# Patient Record
Sex: Female | Born: 1985 | Race: White | Hispanic: No | Smoking: Never smoker
Health system: Southern US, Community
[De-identification: ages and names within clinical notes are randomized; demographics above are authoritative.]

## PROBLEM LIST (undated history)

## (undated) DIAGNOSIS — O24419 Gestational diabetes mellitus in pregnancy, unspecified control: Secondary | ICD-10-CM

## (undated) DIAGNOSIS — Z7251 High risk heterosexual behavior: Secondary | ICD-10-CM

## (undated) DIAGNOSIS — B009 Herpesviral infection, unspecified: Secondary | ICD-10-CM

## (undated) DIAGNOSIS — R809 Proteinuria, unspecified: Secondary | ICD-10-CM

## (undated) HISTORY — DX: High risk heterosexual behavior: Z72.51

## (undated) HISTORY — DX: Gestational diabetes mellitus in pregnancy, unspecified control: O24.419

## (undated) HISTORY — DX: Herpesviral infection, unspecified: B00.9

## (undated) HISTORY — DX: Proteinuria, unspecified: R80.9

---
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
## 2007-02-01 ENCOUNTER — Emergency Department: Payer: Self-pay | Admitting: Emergency Medicine

## 2007-05-30 ENCOUNTER — Emergency Department: Payer: Self-pay | Admitting: Emergency Medicine

## 2007-11-30 ENCOUNTER — Emergency Department: Payer: Self-pay | Admitting: Emergency Medicine

## 2008-06-07 ENCOUNTER — Emergency Department: Payer: Self-pay | Admitting: Emergency Medicine

## 2009-05-23 ENCOUNTER — Emergency Department: Payer: Self-pay | Admitting: Emergency Medicine

## 2010-07-18 ENCOUNTER — Emergency Department: Payer: Self-pay | Admitting: Emergency Medicine

## 2010-08-13 ENCOUNTER — Emergency Department: Payer: Self-pay | Admitting: Emergency Medicine

## 2011-01-22 ENCOUNTER — Emergency Department: Payer: Self-pay | Admitting: Emergency Medicine

## 2013-03-24 ENCOUNTER — Emergency Department: Payer: Self-pay | Admitting: Emergency Medicine

## 2013-03-24 LAB — URINALYSIS, COMPLETE
Bilirubin,UR: NEGATIVE
Blood: NEGATIVE
Glucose,UR: NEGATIVE mg/dL (ref 0–75)
Ph: 6 (ref 4.5–8.0)
Protein: NEGATIVE
RBC,UR: 4 /HPF (ref 0–5)
Specific Gravity: 1.024 (ref 1.003–1.030)
Squamous Epithelial: 1

## 2013-03-24 LAB — PREGNANCY, URINE: Pregnancy Test, Urine: NEGATIVE m[IU]/mL

## 2013-03-25 LAB — WET PREP, GENITAL

## 2013-09-22 ENCOUNTER — Emergency Department: Payer: Self-pay | Admitting: Internal Medicine

## 2013-09-22 LAB — GC/CHLAMYDIA PROBE AMP

## 2013-09-22 LAB — WET PREP, GENITAL

## 2015-03-24 ENCOUNTER — Ambulatory Visit (INDEPENDENT_AMBULATORY_CARE_PROVIDER_SITE_OTHER): Payer: PRIVATE HEALTH INSURANCE | Admitting: Obstetrics and Gynecology

## 2015-03-24 ENCOUNTER — Encounter: Payer: Self-pay | Admitting: Obstetrics and Gynecology

## 2015-03-24 VITALS — BP 119/68 | HR 76 | Ht 66.0 in | Wt 143.2 lb

## 2015-03-24 DIAGNOSIS — Z7251 High risk heterosexual behavior: Secondary | ICD-10-CM | POA: Diagnosis not present

## 2015-03-24 DIAGNOSIS — B009 Herpesviral infection, unspecified: Secondary | ICD-10-CM | POA: Insufficient documentation

## 2015-03-24 DIAGNOSIS — R809 Proteinuria, unspecified: Secondary | ICD-10-CM | POA: Insufficient documentation

## 2015-03-24 NOTE — Progress Notes (Signed)
Subjective:     Patient ID: Cynthia Harrell, female   DOB: November 30, 1985, 29 y.o.   MRN: [id number]  HPI Reports desires for STI screening, had unprotected intercourse 4 days ago with new partner. Denies any symptoms at this time  Review of Systems Has had postcoital BV with previous partner and used boric acid vaginally to treat, last time treated was 3 weeks ago    Objective:   Physical Exam A&O x4 Well groomed thin female in no apparent distress External vulva without redness or lesions Vaginal vault w/o lesions or redness, normal thin d/c noted Cervix parous and with scant white d/c  Wetprep- Occassional clue, neg trich, neg yeast, neg WBC,+ whiff    Assessment:     H/O BV Desires STI screen- too early     Plan:     Ok to use boric acid capsules as needed and previously used.  Will RTC 4 weeks for blood screen for STI.   Cynthia Harrell Trudee Kuster, CNM

## 2015-03-26 ENCOUNTER — Ambulatory Visit (INDEPENDENT_AMBULATORY_CARE_PROVIDER_SITE_OTHER): Payer: PRIVATE HEALTH INSURANCE | Admitting: Obstetrics and Gynecology

## 2015-03-26 ENCOUNTER — Encounter: Payer: Self-pay | Admitting: Obstetrics and Gynecology

## 2015-03-26 VITALS — BP 132/81 | HR 88 | Ht 66.0 in | Wt 141.9 lb

## 2015-03-26 DIAGNOSIS — Z113 Encounter for screening for infections with a predominantly sexual mode of transmission: Secondary | ICD-10-CM

## 2015-03-26 DIAGNOSIS — N9089 Other specified noninflammatory disorders of vulva and perineum: Secondary | ICD-10-CM

## 2015-03-26 NOTE — Progress Notes (Signed)
Subjective:     Patient ID: Cynthia Harrell, female   DOB: 06/03/1986, 29 y.o.   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  Cervix nulliparous and negative CMT, no lesions or d/c Wetprep negative CLUE, neg trich, neg yeast, Occassional WBC     Assessment:     Vulvar lesion, c/w HSV     Plan:     Nuswab sent in  to continue acyclovir. RTC in 2 weeks or later for blood panel to screen for other STIs  Mckinnley Cottier Trudee Kuster, CNM

## 2015-03-29 LAB — CT NG TV HSV BY NAA
Chlamydia by NAA: NEGATIVE
Gonococcus by NAA: NEGATIVE
HSV 1 NAA: NEGATIVE
HSV 2 NAA: NEGATIVE

## 2015-03-30 ENCOUNTER — Ambulatory Visit: Payer: Self-pay | Admitting: Family Medicine

## 2015-03-31 ENCOUNTER — Telehealth: Payer: Self-pay | Admitting: *Deleted

## 2015-03-31 NOTE — Telephone Encounter (Signed)
-----   Message from Evonnie Pat, [place] sent at 03/31/2015  9:31 AM EDT ----- Please let her know Nuswab was negative for STDs

## 2015-03-31 NOTE — Telephone Encounter (Signed)
Notified pt of normal lab results

## 2015-04-03 ENCOUNTER — Encounter: Payer: Self-pay | Admitting: Obstetrics and Gynecology

## 2015-04-03 ENCOUNTER — Ambulatory Visit (INDEPENDENT_AMBULATORY_CARE_PROVIDER_SITE_OTHER): Payer: PRIVATE HEALTH INSURANCE | Admitting: Obstetrics and Gynecology

## 2015-04-03 VITALS — BP 116/76 | HR 68 | Ht 66.0 in | Wt 142.2 lb

## 2015-04-03 DIAGNOSIS — B373 Candidiasis of vulva and vagina: Secondary | ICD-10-CM

## 2015-04-03 DIAGNOSIS — B3731 Acute candidiasis of vulva and vagina: Secondary | ICD-10-CM

## 2015-04-03 MED ORDER — FLUCONAZOLE 150 MG PO TABS: 150.0000 mg | ORAL_TABLET | Freq: Once | ORAL | Status: DC

## 2015-04-03 NOTE — Progress Notes (Signed)
Patient ID: Cynthia Harrell, female   DOB: 1985/12/21, 29 y.o.   MRN: [id number] S: She complains of vaginal discharge for 3 days; described as itchy, white, non bloody, without pelvic pain or abnormal vaginal bleeding. Also reports a small nontender bump on right labia x3 days.  O: A&Ox4, slightly anxious Pelvic exam: normal external genitalia, vulva, vagina, cervix, uterus and adnexa, VULVA: normal appearing vulva with no masses, tenderness or lesions, vulvar lesion c/w folliculitis, VAGINA: normal appearing vagina with normal color and discharge, no lesions, CERVIX: normal appearing cervix without discharge or lesions, cervical discharge present - white and creamy, WET MOUNT done - results: lactobacilli. Microscopic wet-mount exam shows negative for pathogens, normal epithelial cells, lactobacilli.  A: yeast infection Folliculitis secondary to shaving  P: Rx for diflucan 150mg , 3 refills  RTC prn Reassured of no herpatic lesions noted.   Olene Godfrey Trudee Kuster, CNM

## 2015-04-07 LAB — NUSWAB VAGINITIS PLUS (VG+)
ATOPOBIUM VAGINAE: HIGH {score} — AB
BVAB 2: HIGH {score} — AB
CANDIDA GLABRATA, NAA: NEGATIVE
Megasphaera 1: HIGH Score — AB

## 2015-04-09 ENCOUNTER — Other Ambulatory Visit: Payer: Self-pay | Admitting: Obstetrics and Gynecology

## 2015-04-09 ENCOUNTER — Telehealth: Payer: Self-pay | Admitting: *Deleted

## 2015-04-09 DIAGNOSIS — N760 Acute vaginitis: Principal | ICD-10-CM

## 2015-04-09 DIAGNOSIS — B9689 Other specified bacterial agents as the cause of diseases classified elsewhere: Secondary | ICD-10-CM

## 2015-04-09 MED ORDER — CLINDAMYCIN PHOSPHATE (1 DOSE) 2 % VA CREA: 1.0000 | TOPICAL_CREAM | Freq: Two times a day (BID) | VAGINAL | Status: DC

## 2015-04-09 NOTE — Telephone Encounter (Signed)
-----   Message from Evonnie Pat, [place] sent at 04/09/2015 12:23 PM EDT ----- Please let her know vaginal swab was + for BV only, no signs of STDs, see when she last took a dose of the medicine ( and what she took) and if she is feeling better? As I may need to call her in a new rx

## 2015-04-09 NOTE — Telephone Encounter (Signed)
Left detailed message for pt 

## 2015-04-09 NOTE — Telephone Encounter (Signed)
Notified pt of results, states she really isnt feeling much better, she used a boric acid last night due to lots of d/c, states she feels like she may do better with The flagyl, wanted to know what you thought???

## 2015-04-09 NOTE — Telephone Encounter (Signed)
Actually think we need to treat with Clindese gel- rx sent in and she is to use x 2 nights as directed on box, let her know it will be a little more expensive, but worth it if it gets rid of the discharge for good.

## 2015-04-10 ENCOUNTER — Telehealth: Payer: Self-pay | Admitting: Obstetrics and Gynecology

## 2015-04-10 NOTE — Telephone Encounter (Signed)
Spoke with pt and reviewed on how to take medication

## 2015-04-10 NOTE — Telephone Encounter (Signed)
Question about cream for vag bacterial inf... Her rx doesn't say how many days she needs to use it and she wants to know.

## 2015-04-13 ENCOUNTER — Encounter: Payer: Self-pay | Admitting: Family Medicine

## 2015-04-13 ENCOUNTER — Ambulatory Visit (INDEPENDENT_AMBULATORY_CARE_PROVIDER_SITE_OTHER): Payer: PRIVATE HEALTH INSURANCE | Admitting: Family Medicine

## 2015-04-13 VITALS — BP 121/78 | HR 73 | Temp 98.6°F | Ht 67.0 in | Wt 143.0 lb

## 2015-04-13 DIAGNOSIS — Z202 Contact with and (suspected) exposure to infections with a predominantly sexual mode of transmission: Secondary | ICD-10-CM | POA: Diagnosis not present

## 2015-04-13 DIAGNOSIS — N760 Acute vaginitis: Secondary | ICD-10-CM | POA: Diagnosis not present

## 2015-04-13 DIAGNOSIS — B9689 Other specified bacterial agents as the cause of diseases classified elsewhere: Secondary | ICD-10-CM

## 2015-04-13 DIAGNOSIS — Z7251 High risk heterosexual behavior: Secondary | ICD-10-CM

## 2015-04-13 DIAGNOSIS — A499 Bacterial infection, unspecified: Secondary | ICD-10-CM

## 2015-04-13 LAB — WET PREP FOR TRICH, YEAST, CLUE
Clue Cell Exam: POSITIVE — AB
Trichomonas Exam: NEGATIVE
Yeast Exam: NEGATIVE

## 2015-04-13 NOTE — Patient Instructions (Addendum)
Please do all you can to be safe in the future Limit alcohol use Keep condoms with you  Safe Sex Safe sex is about reducing the risk of giving or getting a sexually transmitted disease (STD). STDs are spread through sexual contact involving the genitals, mouth, or rectum. Some STDs can be cured and others cannot. Safe sex can also prevent unintended pregnancies.  WHAT ARE SOME SAFE SEX PRACTICES?  Limit your sexual activity to only one partner who is having sex with only you.  Talk to your partner about his or her past partners, past STDs, and drug use.  Use a condom every time you have sexual intercourse. This includes vaginal, oral, and anal sexual activity. Both females and males should wear condoms during oral sex. Only use latex or polyurethane condoms and water-based lubricants. Using petroleum-based lubricants or oils to lubricate a condom will weaken the condom and increase the chance that it will break. The condom should be in place from the beginning to the end of sexual activity. Wearing a condom reduces, but does not completely eliminate, your risk of getting or giving an STD. STDs can be spread by contact with infected body fluids and skin.  Get vaccinated for hepatitis B and HPV.  Avoid alcohol and recreational drugs, which can affect your judgment. You may forget to use a condom or participate in high-risk sex.  For females, avoid douching after sexual intercourse. Douching can spread an infection farther into the reproductive tract.  Check your body for signs of sores, blisters, rashes, or unusual discharge. See your health care provider if you notice any of these signs.  Avoid sexual contact if you have symptoms of an infection or are being treated for an STD. If you or your partner has herpes, avoid sexual contact when blisters are present. Use condoms at all other times.  If you are at risk of being infected with HIV, it is recommended that you take a prescription medicine  daily to prevent HIV infection. This is called pre-exposure prophylaxis (PrEP). You are considered at risk if:  You are a man who has sex with other men (MSM).  You are a heterosexual man or woman who is sexually active with more than one partner.  You take drugs by injection.  You are sexually active with a partner who has HIV.  Talk with your health care provider about whether you are at high risk of being infected with HIV. If you choose to begin PrEP, you should first be tested for HIV. You should then be tested every 3 months for as long as you are taking PrEP.  See your health care provider for regular screenings, exams, and tests for other STDs. Before having sex with a new partner, each of you should be screened for STDs and should talk about the results with each other. WHAT ARE THE BENEFITS OF SAFE SEX?   There is less chance of getting or giving an STD.  You can prevent unwanted or unintended pregnancies.  By discussing safe sex concerns with your partner, you may increase feelings of intimacy, comfort, trust, and honesty between the two of you. Document Released: 10/13/2004 Document Revised: 01/20/2014 Document Reviewed: 02/27/2012 [facility] Patient Information 2015 [place], [place]. This information is not intended to replace advice given to you by your health care provider. Make sure you discuss any questions you have with your health care provider.

## 2015-04-13 NOTE — Progress Notes (Signed)
BP 121/78 mmHg  Pulse 73  Temp(Src) 98.6 F (37 C)  Ht 5\' 7"  (1.702 m)  Wt 143 lb (64.864 kg)  BMI 22.39 kg/m2  SpO2 99%  LMP 03/27/2015 (Approximate)   Subjective:    Patient ID: Cynthia Harrell, female    DOB: 27-Sep-1985, 29 y.o.   MRN: [id number]  HPI: Cynthia Harrell is a 29 y.o. female  Chief Complaint  Patient presents with  . std check   She is not having any symptoms; no fevers, no discharge She had a new partner; it was kind of a crazy night and she doesn't remember much; he did tell her they had sex She has been a little stressed out She drank too much; not abused, not forced, not roughed up; he had had too much to drink too, so they both had their guard down She just saw Melody at [facility] and had testing done 10 days ago, since the occurrence; she was freaking out and had discharge then; was diagnosed with BV  Relevant past medical, surgical, family and social history reviewed and updated as indicated. Interim medical history since our last visit reviewed. Allergies and medications reviewed and updated.  Review of Systems  Constitutional: Negative for fever.  Genitourinary: Negative for dysuria, vaginal discharge (none now, but did have some last week) and genital sores.  Per HPI unless specifically indicated above     Objective:    BP 121/78 mmHg  Pulse 73  Temp(Src) 98.6 F (37 C)  Ht 5\' 7"  (1.702 m)  Wt 143 lb (64.864 kg)  BMI 22.39 kg/m2  SpO2 99%  LMP 03/27/2015 (Approximate)  Wt Readings from Last 3 Encounters:  04/13/15 143 lb (64.864 kg)  04/03/15 142 lb 3.2 oz (64.501 kg)  03/26/15 141 lb 14.4 oz (64.365 kg)    Physical Exam  Constitutional: She appears well-developed and well-nourished.  Cardiovascular: Normal rate and regular rhythm.   Pulmonary/Chest: Effort normal.  Abdominal: Soft. Bowel sounds are normal. She exhibits no distension. There is no tenderness. There is no guarding.  Genitourinary: There is no rash, tenderness, lesion or  injury on the right labia. There is no rash, tenderness, lesion or injury on the left labia. Uterus is not tender. Cervix exhibits no motion tenderness, no discharge and no friability. Right adnexum displays no mass, no tenderness and no fullness. Left adnexum displays no mass, no tenderness and no fullness. No erythema in the vagina. No signs of injury around the vagina. No vaginal discharge found.  Psychiatric: Her speech is normal and behavior is normal. Judgment and thought content normal. Her mood appears anxious (mildly anxious). Cognition and memory are normal.    Results for orders placed or performed in visit on 04/03/15  NuSwab Vaginitis Plus (VG+)  Result Value Ref Range   Atopobium vaginae High - 2 (A) Score   BVAB 2 High - 2 (A) Score   Megasphaera 1 High - 2 (A) Score   Candida glabrata, NAA Negative Negative      Assessment & Plan:   Problem List Items Addressed This Visit      Other   High-risk sexual behavior    Encouraged condom use, safe partner selection, limit alcohol use      Relevant Orders   HIV antibody (with reflex)   RPR   Wet prep, genital   Hepatitis panel, acute   GC/chlamydia probe amp, urine    Other Visit Diagnoses    Contact with and suspected exposure to infections  with predominantly sexual mode of transmission    -  Primary    Relevant Orders    HIV antibody (with reflex)    RPR    Wet prep, genital    Hepatitis panel, acute    GC/chlamydia probe amp, urine    Bacterial vaginosis        recurrent; managed by GYN       Follow up plan: No Follow-up on file.  Orders Placed This Encounter  Procedures  . Wet prep, genital  . HIV antibody (with reflex)  . RPR  . Hepatitis panel, acute  . GC/chlamydia probe amp, urine

## 2015-04-13 NOTE — Assessment & Plan Note (Signed)
Encouraged condom use, safe partner selection, limit alcohol use

## 2015-04-13 NOTE — Addendum Note (Signed)
Addended by: Sagal Gayton, Satira Anis on: 04/13/2015 09:37 AM   Modules accepted: Orders

## 2015-04-14 ENCOUNTER — Telehealth: Payer: Self-pay | Admitting: Obstetrics and Gynecology

## 2015-04-14 ENCOUNTER — Telehealth: Payer: Self-pay | Admitting: Family Medicine

## 2015-04-14 LAB — RPR: RPR: NONREACTIVE

## 2015-04-14 LAB — GC/CHLAMYDIA PROBE AMP
CHLAMYDIA, DNA PROBE: NEGATIVE
Neisseria gonorrhoeae by PCR: NEGATIVE

## 2015-04-14 LAB — HEPATITIS PANEL, ACUTE
HEP A IGM: NEGATIVE
HEP B S AG: NEGATIVE
Hep B C IgM: NEGATIVE
Hep C Virus Ab: <0.1 s/co ratio (ref 0.0–0.9)

## 2015-04-14 LAB — HIV ANTIBODY (ROUTINE TESTING W REFLEX): HIV SCREEN 4TH GENERATION: NONREACTIVE

## 2015-04-14 NOTE — Telephone Encounter (Signed)
Pt wants you to know she was seen by another dr and he gave her some cream for bacterial vaginitis for 2 days. She wants to know if she needs a f/u with midwife Cynthia Harrell

## 2015-04-14 NOTE — Telephone Encounter (Signed)
Please let pt know HIV, syphilis, and hepatitis labs were all negative

## 2015-04-14 NOTE — Telephone Encounter (Signed)
Patient notified

## 2015-04-14 NOTE — Telephone Encounter (Signed)
Melody what do you suggest for this pt???

## 2015-04-15 ENCOUNTER — Telehealth: Payer: Self-pay

## 2015-04-15 NOTE — Telephone Encounter (Signed)
Not unless symptoms persist or return > 2 weeks from last dose of medicine

## 2015-04-15 NOTE — Telephone Encounter (Signed)
Please look in the chart; I put in a message on teh 26th and you actually talked to her on the 26th about her results Was there something else?

## 2015-04-15 NOTE — Telephone Encounter (Signed)
Notified pt she voiced understanding 

## 2015-04-15 NOTE — Telephone Encounter (Signed)
Patient called and wanted the rest of her lab results.

## 2015-04-16 NOTE — Telephone Encounter (Signed)
The GC/Chlam was not back at the time. Patient notified those were also negative.

## 2015-05-06 ENCOUNTER — Encounter: Payer: Self-pay | Admitting: Obstetrics and Gynecology

## 2015-05-06 ENCOUNTER — Ambulatory Visit (INDEPENDENT_AMBULATORY_CARE_PROVIDER_SITE_OTHER): Payer: PRIVATE HEALTH INSURANCE | Admitting: Obstetrics and Gynecology

## 2015-05-06 VITALS — BP 122/79 | HR 75 | Ht 67.0 in | Wt 142.4 lb

## 2015-05-06 DIAGNOSIS — B373 Candidiasis of vulva and vagina: Secondary | ICD-10-CM | POA: Diagnosis not present

## 2015-05-06 DIAGNOSIS — B3731 Acute candidiasis of vulva and vagina: Secondary | ICD-10-CM

## 2015-05-06 MED ORDER — FLUCONAZOLE 150 MG PO TABS: 150.0000 mg | ORAL_TABLET | Freq: Once | ORAL | Status: DC

## 2015-05-06 NOTE — Progress Notes (Signed)
Patient ID: Cynthia Harrell, female   DOB: 06-26-1986, 29 y.o.   MRN: [id number]  CHIEF COMPLAINT/HPI:  29 y.o. female complains of white, thin and vaginal erythema noted vaginal discharge for 2 week(s). Denies abnormal vaginal bleeding, significant pelvic pain or fever. No UTI symptoms. Sexually active, does not use condoms, no change in partner.  Last unprotected intercourse __ days ago.  Denies history of known exposure to STD or symptoms in partner.  Patient's last menstrual period was 04/27/2015.  No history of STD's.  Review of Systems  Constitutional: Negative for fever and chills Eyes: Negative for visual disturbances Respiratory: Negative for shortness of breath, dyspnea Cardiovascular: Negative for chest pain or palpitations  Gastrointestinal: Negative for vomiting, diarrhea and constipation Genitourinary: Negative for dysuria and urgency Musculoskeletal: Negative for back pain, joint pain, myalgias  Neurological: Negative for dizziness and headaches    Past Medical History: Past Medical History  Diagnosis Date  . 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 Microalbuminuria   . High-risk sexual behavior     Past Surgical History: Past Surgical History  Procedure Laterality Date  . Therapeutic abortion  2008    Obstetrical History: OB History    Gravida Para Term Preterm AB TAB SAB Ectopic Multiple Living   1    1 1           Gynecological History: Pertinent Gynecological History: Menses: flow is moderate and regular every month without intermenstrual spotting Bleeding: none Contraception: abstinence and condoms DES exposure: denies Blood transfusions: none Sexually transmitted diseases: recent diagnosis: herpes and gonnorhea and past history: last act of intercourse 03/20/15 Previous GYN Procedures: labs  Last mammogram: NA Date: NA Last pap: normal Date: 2015   Social History: Social History   Social History  . Marital Status: Single    Spouse Name: N/A  .  Number of Children: N/A  . Years of Education: N/A   Social History Main Topics  . Smoking status: Never Smoker   . Smokeless tobacco: Never Used  . Alcohol Use: No  . Drug Use: No  . Sexual Activity: Yes    Birth Control/ Protection: Condom   Other Topics Concern  . None   Social History Narrative    Family History: Family History  Problem Relation Age of Onset  . Diabetes Father   . Hypertension Mother     Allergies: No Known Allergies      PHYSICAL EXAM: Pelvic - normal external genitalia, vulva, vagina, cervix, uterus and adnexa, VULVA: normal appearing vulva with no masses, tenderness or lesions, vulvar tenderness right labia fold, vulvar erythema labial folds bilaterally, VAGINA: normal appearing vagina with normal color and discharge, no lesions, CERVIX: cervical discharge present - white, copious and thin, cervical motion tenderness absent, nulliparous os, WET MOUNT done - results: KOH done, hyphae, lactobacilli   Labs: No results found for this or any previous visit (from the past 24 hour(s)).   Assessment: Patient Active Problem List   Diagnosis Date Noted  . 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 Microalbuminuria   . High-risk sexual behavior    monial vaginitis with vulvitis Plan:  No orders of the defined types were placed in this encounter.   rx sent in for diflucan x 1 dose  RTC prn  Lynzee Lindquist Valene Bors

## 2015-06-09 ENCOUNTER — Ambulatory Visit (INDEPENDENT_AMBULATORY_CARE_PROVIDER_SITE_OTHER): Payer: PRIVATE HEALTH INSURANCE | Admitting: Obstetrics and Gynecology

## 2015-06-09 ENCOUNTER — Encounter: Payer: Self-pay | Admitting: Obstetrics and Gynecology

## 2015-06-09 VITALS — BP 105/79 | HR 92 | Ht 66.0 in | Wt 137.6 lb

## 2015-06-09 DIAGNOSIS — N762 Acute vulvitis: Secondary | ICD-10-CM | POA: Diagnosis not present

## 2015-06-09 NOTE — Progress Notes (Signed)
Subjective:     Patient ID: [name], female   DOB: 1985-10-01, 29 y.o.   MRN: [id number]  HPI Reports right vulvar irritation without skin changes x 1 week, concerned that yeast infection may have returned  Review of Systems See above Had HSV outbreak 2 weeks ago    Objective:   Physical Exam A&O X4 Well groomed female in no distress Pelvic exam: normal external genitalia, vulva, vagina, cervix, uterus and adnexa. Microscopic wet-mount exam shows negative for pathogens, normal epithelial cells, vaginal pH is 6.5.    Assessment:     Intermittent vulvitis with recurrent HSV outbreaks     Plan:     Reiterated need to take daily Valtrex, may use boric acid capsules qod prn.  Melody Trudee Kuster, cnm

## 2015-06-15 ENCOUNTER — Ambulatory Visit (INDEPENDENT_AMBULATORY_CARE_PROVIDER_SITE_OTHER): Payer: PRIVATE HEALTH INSURANCE | Admitting: Family Medicine

## 2015-06-15 ENCOUNTER — Encounter: Payer: Self-pay | Admitting: Family Medicine

## 2015-06-15 VITALS — BP 131/87 | HR 76 | Temp 98.5°F | Wt 138.0 lb

## 2015-06-15 DIAGNOSIS — F411 Generalized anxiety disorder: Secondary | ICD-10-CM | POA: Diagnosis not present

## 2015-06-15 DIAGNOSIS — B009 Herpesviral infection, unspecified: Secondary | ICD-10-CM | POA: Diagnosis not present

## 2015-06-15 DIAGNOSIS — Z7251 High risk heterosexual behavior: Secondary | ICD-10-CM | POA: Diagnosis not present

## 2015-06-15 MED ORDER — ESCITALOPRAM OXALATE 10 MG PO TABS: 10.0000 mg | ORAL_TABLET | Freq: Every day | ORAL | Status: DC

## 2015-06-15 NOTE — Patient Instructions (Addendum)
Check out therapists on [employer] Start the new medicine Return in 4 weeks for recheck Call me sooner if any problems on the medicine We'll contact you about the lab results If you have not heard anything from my staff in a week about any orders/referrals/studies from today, please contact us here to follow-up [phone] Limit tramadol use while on this medicine; risk of serotonin syndrome with both together for more than just a couple of pills  Safe Sex Safe sex is about reducing the risk of giving or getting a sexually transmitted disease (STD). STDs are spread through sexual contact involving the genitals, mouth, or rectum. Some STDs can be cured and others cannot. Safe sex can also prevent unintended pregnancies.  WHAT ARE SOME SAFE SEX PRACTICES?  Limit your sexual activity to only one partner who is having sex with only you.  Talk to your partner about his or her past partners, past STDs, and drug use.  Use a condom every time you have sexual intercourse. This includes vaginal, oral, and anal sexual activity. Both females and males should wear condoms during oral sex. Only use latex or polyurethane condoms and water-based lubricants. Using petroleum-based lubricants or oils to lubricate a condom will weaken the condom and increase the chance that it will break. The condom should be in place from the beginning to the end of sexual activity. Wearing a condom reduces, but does not completely eliminate, your risk of getting or giving an STD. STDs can be spread by contact with infected body fluids and skin.  Get vaccinated for hepatitis B and HPV.  Avoid alcohol and recreational drugs, which can affect your judgment. You may forget to use a condom or participate in high-risk sex.  For females, avoid douching after sexual intercourse. Douching can spread an infection farther into the reproductive tract.  Check your body for signs of sores, blisters, rashes, or unusual discharge. See  your health care provider if you notice any of these signs.  Avoid sexual contact if you have symptoms of an infection or are being treated for an STD. If you or your partner has herpes, avoid sexual contact when blisters are present. Use condoms at all other times.  If you are at risk of being infected with HIV, it is recommended that you take a prescription medicine daily to prevent HIV infection. This is called pre-exposure prophylaxis (PrEP). You are considered at risk if:  You are a man who has sex with other men (MSM).  You are a heterosexual man or woman who is sexually active with more than one partner.  You take drugs by injection.  You are sexually active with a partner who has HIV.  Talk with your health care provider about whether you are at high risk of being infected with HIV. If you choose to begin PrEP, you should first be tested for HIV. You should then be tested every 3 months for as long as you are taking PrEP.  See your health care provider for regular screenings, exams, and tests for other STDs. Before having sex with a new partner, each of you should be screened for STDs and should talk about the results with each other. WHAT ARE THE BENEFITS OF SAFE SEX?   There is less chance of getting or giving an STD.  You can prevent unwanted or unintended pregnancies.  By discussing safe sex concerns with your partner, you may increase feelings of intimacy, comfort, trust, and honesty between the two of you. Document  Released: 10/13/2004 Document Revised: 01/20/2014 Document Reviewed: 02/27/2012 [facility] Patient Information 2015 [place], [place]. This information is not intended to replace advice given to you by your health care provider. Make sure you discuss any questions you have with your health care provider. Sexually Transmitted Disease A sexually transmitted disease (STD) is a disease or infection that may be passed (transmitted) from person to person, usually during  sexual activity. This may happen by way of saliva, semen, blood, vaginal mucus, or urine. Common STDs include:   Gonorrhea.   Chlamydia.   Syphilis.   HIV and AIDS.   Genital herpes.   Hepatitis B and C.   Trichomonas.   Human papillomavirus (HPV).   Pubic lice.   Scabies.  Mites.  Bacterial vaginosis. WHAT ARE CAUSES OF STDs? An STD may be caused by bacteria, a virus, or parasites. STDs are often transmitted during sexual activity if one person is infected. However, they may also be transmitted through nonsexual means. STDs may be transmitted after:   Sexual intercourse with an infected person.   Sharing sex toys with an infected person.   Sharing needles with an infected person or using unclean piercing or tattoo needles.  Having intimate contact with the genitals, mouth, or rectal areas of an infected person.   Exposure to infected fluids during birth. WHAT ARE THE SIGNS AND SYMPTOMS OF STDs? Different STDs have different symptoms. Some people may not have any symptoms. If symptoms are present, they may include:   Painful or bloody urination.   Pain in the pelvis, abdomen, vagina, anus, throat, or eyes.   A skin rash, itching, or irritation.  Growths, ulcerations, blisters, or sores in the genital and anal areas.  Abnormal vaginal discharge with or without bad odor.   Penile discharge in men.   Fever.   Pain or bleeding during sexual intercourse.   Swollen glands in the groin area.   Yellow skin and eyes (jaundice). This is seen with hepatitis.   Swollen testicles.  Infertility.  Sores and blisters in the mouth. HOW ARE STDs DIAGNOSED? To make a diagnosis, your health care provider may:   Take a medical history.   Perform a physical exam.   Take a sample of any discharge to examine.  Swab the throat, cervix, opening to the penis, rectum, or vagina for testing.  Test a sample of your first morning urine.   Perform  blood tests.   Perform a Pap test, if this applies.   Perform a colposcopy.   Perform a laparoscopy.  HOW ARE STDs TREATED? Treatment depends on the STD. Some STDs may be treated but not cured.   Chlamydia, gonorrhea, trichomonas, and syphilis can be cured with antibiotic medicine.   Genital herpes, hepatitis, and HIV can be treated, but not cured, with prescribed medicines. The medicines lessen symptoms.   Genital warts from HPV can be treated with medicine or by freezing, burning (electrocautery), or surgery. Warts may come back.   HPV cannot be cured with medicine or surgery. However, abnormal areas may be removed from the cervix, vagina, or vulva.   If your diagnosis is confirmed, your recent sexual partners need treatment. This is true even if they are symptom-free or have a negative culture or evaluation. They should not have sex until their health care providers say it is okay. HOW CAN I REDUCE MY RISK OF GETTING AN STD? Take these steps to reduce your risk of getting an STD:  Use latex condoms, dental dams, and water-soluble  lubricants during sexual activity. Do not use petroleum jelly or oils.  Avoid having multiple sex partners.  Do not have sex with someone who has other sex partners.  Do not have sex with anyone you do not know or who is at high risk for an STD.  Avoid risky sex practices that can break your skin.  Do not have sex if you have open sores on your mouth or skin.  Avoid drinking too much alcohol or taking illegal drugs. Alcohol and drugs can affect your judgment and put you in a vulnerable position.  Avoid engaging in oral and anal sex acts.  Get vaccinated for HPV and hepatitis. If you have not received these vaccines in the past, talk to your health care provider about whether one or both might be right for you.   If you are at risk of being infected with HIV, it is recommended that you take a prescription medicine daily to prevent HIV  infection. This is called pre-exposure prophylaxis (PrEP). You are considered at risk if:  You are a man who has sex with other men (MSM).  You are a heterosexual man or woman and are sexually active with more than one partner.  You take drugs by injection.  You are sexually active with a partner who has HIV.  Talk with your health care provider about whether you are at high risk of being infected with HIV. If you choose to begin PrEP, you should first be tested for HIV. You should then be tested every 3 months for as long as you are taking PrEP.  WHAT SHOULD I DO IF I THINK I HAVE AN STD?  See your health care provider.   Tell your sexual partner(s). They should be tested and treated for any STDs.  Do not have sex until your health care provider says it is okay. WHEN SHOULD I GET IMMEDIATE MEDICAL CARE? Contact your health care provider right away if:   You have severe abdominal pain.  You are a man and notice swelling or pain in your testicles.  You are a woman and notice swelling or pain in your vagina. Document Released: 11/26/2002 Document Revised: 09/10/2013 Document Reviewed: 03/26/2013 [employer] Patient Information 2015 [place], [place]. This information is not intended to replace advice given to you by your health care provider. Make sure you discuss any questions you have with your health care provider.

## 2015-06-15 NOTE — Assessment & Plan Note (Signed)
Start new medicine; return in 4 weeks, but call sooner if needed; discussed risk of serotonin syndrome, don't use tramadol often

## 2015-06-15 NOTE — Assessment & Plan Note (Signed)
Limit stress; encouraged her to start working with counselor to work on coping strategies

## 2015-06-15 NOTE — Progress Notes (Signed)
BP 131/87 mmHg  Pulse 76  Temp(Src) 98.5 F (36.9 C)  Wt 138 lb (62.596 kg)  SpO2 99%  LMP 06/14/2015 (Approximate)   Subjective:    Patient ID: Cynthia Harrell, female    DOB: 1986/07/03, 29 y.o.   MRN: [id number]  HPI: Cynthia Harrell is a 29 y.o. female  Chief Complaint  Patient presents with  . Exposure to STD    She just wants to get retested from last appointment.   No trips to urgent care since last visit; she went to see Gillie Manners last week or the week before; she thought she had a yeast infection; pH was a little off, but no BV or yeast; just pH was off; she was having some discharge, mild itching; she had had a yeast infection in August She wants to be tested for STDs again, HIV, RPR, and hepatitis No episodes of unprotected intercourse since July She denies being in a position in which she is being coerced or forced to have sex; no controlling ex-boyfriend, e.g. No fevers or night sweats; one or two herpes outbreaks since July 1st; [name] has her taking her preventive treatment once a day, but has been under some stress We talked about stress, something prescribed earlier to help her mind ease off; she thought about maybe going back on those; she finds herself worrying about anything, everything; can worry about a lot of different things; she can talk herself out of it for a bit, but keeps coming in her mind  Depression screen [facility] 2/9 06/15/2015  Decreased Interest 3  Down, Depressed, Hopeless 1  PHQ - 2 Score 4  Altered sleeping 1  Tired, decreased energy 1  Change in appetite 1  Feeling bad or failure about yourself  1  Trouble concentrating 0  Moving slowly or fidgety/restless 0  Suicidal thoughts 0  PHQ-9 Score 8   She is currently on her period  Relevant past medical, surgical, family and social history reviewed and updated as indicated. Interim medical history since our last visit reviewed. Allergies and medications reviewed and updated.  Review of  Systems Per HPI unless specifically indicated above     Objective:    BP 131/87 mmHg  Pulse 76  Temp(Src) 98.5 F (36.9 C)  Wt 138 lb (62.596 kg)  SpO2 99%  LMP 06/14/2015 (Approximate)  Wt Readings from Last 3 Encounters:  06/15/15 138 lb (62.596 kg)  06/09/15 137 lb 9.6 oz (62.415 kg)  05/06/15 142 lb 6.4 oz (64.592 kg)    Physical Exam  Constitutional: She appears well-developed and well-nourished.  HENT:  Mouth/Throat: Mucous membranes are normal.  Eyes: EOM are normal. No scleral icterus.  Cardiovascular: Normal rate and regular rhythm.   Pulmonary/Chest: Effort normal and breath sounds normal.  Psychiatric: She has a normal mood and affect. Her behavior is normal.       Assessment & Plan:   Problem List Items Addressed This Visit      Other   Herpes simplex without complication    Limit stress; encouraged her to start working with counselor to work on coping strategies      High-risk sexual behavior - Primary    Check STD screen at patient's request; see AVS for counseling information given      Relevant Orders   HIV antibody   RPR   Hepatitis panel, acute   Generalized anxiety disorder    Start new medicine; return in 4 weeks, but call sooner if needed;  discussed risk of serotonin syndrome, don't use tramadol often         Follow up plan: Return in about 4 weeks (around 07/13/2015) for medicine follow-up.  Orders Placed This Encounter  Procedures  . HIV antibody  . RPR  . Hepatitis panel, acute   Meds ordered this encounter  Medications  . escitalopram (LEXAPRO) 10 MG tablet    Sig: Take 1 tablet (10 mg total) by mouth daily.    Dispense:  30 tablet    Refill:  0

## 2015-06-15 NOTE — Assessment & Plan Note (Addendum)
Check STD screen at patient's request; see AVS for counseling information given

## 2015-06-16 LAB — HIV ANTIBODY (ROUTINE TESTING W REFLEX): HIV Screen 4th Generation wRfx: NONREACTIVE

## 2015-06-16 LAB — HEPATITIS PANEL, ACUTE
HEP A IGM: NEGATIVE
HEP B S AG: NEGATIVE
Hep B C IgM: NEGATIVE

## 2015-06-16 LAB — RPR: RPR Ser Ql: NONREACTIVE

## 2015-07-08 ENCOUNTER — Encounter: Payer: Self-pay | Admitting: Family Medicine

## 2015-07-08 ENCOUNTER — Ambulatory Visit (INDEPENDENT_AMBULATORY_CARE_PROVIDER_SITE_OTHER): Payer: PRIVATE HEALTH INSURANCE | Admitting: Family Medicine

## 2015-07-08 VITALS — BP 115/76 | HR 80 | Temp 98.3°F | Wt 141.0 lb

## 2015-07-08 DIAGNOSIS — Z7251 High risk heterosexual behavior: Secondary | ICD-10-CM | POA: Diagnosis not present

## 2015-07-08 DIAGNOSIS — A499 Bacterial infection, unspecified: Secondary | ICD-10-CM | POA: Diagnosis not present

## 2015-07-08 DIAGNOSIS — N898 Other specified noninflammatory disorders of vagina: Secondary | ICD-10-CM | POA: Insufficient documentation

## 2015-07-08 DIAGNOSIS — Z8742 Personal history of other diseases of the female genital tract: Secondary | ICD-10-CM | POA: Diagnosis not present

## 2015-07-08 DIAGNOSIS — F411 Generalized anxiety disorder: Secondary | ICD-10-CM

## 2015-07-08 DIAGNOSIS — B9689 Other specified bacterial agents as the cause of diseases classified elsewhere: Secondary | ICD-10-CM

## 2015-07-08 DIAGNOSIS — N760 Acute vaginitis: Secondary | ICD-10-CM | POA: Diagnosis not present

## 2015-07-08 LAB — WET PREP FOR TRICH, YEAST, CLUE
CLUE CELL EXAM: POSITIVE — AB
Trichomonas Exam: NEGATIVE
Yeast Exam: NEGATIVE

## 2015-07-08 NOTE — Patient Instructions (Addendum)
Do practice safe sex and smart partner selection Return some morning soon for urine testing We'll contact you about the test done today  Safe Sex Safe sex is about reducing the risk of giving or getting a sexually transmitted disease (STD). STDs are spread through sexual contact involving the genitals, mouth, or rectum. Some STDs can be cured and others cannot. Safe sex can also prevent unintended pregnancies.  WHAT ARE SOME SAFE SEX PRACTICES?  Limit your sexual activity to only one partner who is having sex with only you.  Talk to your partner about his or her past partners, past STDs, and drug use.  Use a condom every time you have sexual intercourse. This includes vaginal, oral, and anal sexual activity. Both females and males should wear condoms during oral sex. Only use latex or polyurethane condoms and water-based lubricants. Using petroleum-based lubricants or oils to lubricate a condom will weaken the condom and increase the chance that it will break. The condom should be in place from the beginning to the end of sexual activity. Wearing a condom reduces, but does not completely eliminate, your risk of getting or giving an STD. STDs can be spread by contact with infected body fluids and skin.  Get vaccinated for hepatitis B and HPV.  Avoid alcohol and recreational drugs, which can affect your judgment. You may forget to use a condom or participate in high-risk sex.  For females, avoid douching after sexual intercourse. Douching can spread an infection farther into the reproductive tract.  Check your body for signs of sores, blisters, rashes, or unusual discharge. See your health care provider if you notice any of these signs.  Avoid sexual contact if you have symptoms of an infection or are being treated for an STD. If you or your partner has herpes, avoid sexual contact when blisters are present. Use condoms at all other times.  If you are at risk of being infected with HIV, it is  recommended that you take a prescription medicine daily to prevent HIV infection. This is called pre-exposure prophylaxis (PrEP). You are considered at risk if:  You are a man who has sex with other men (MSM).  You are a heterosexual man or woman who is sexually active with more than one partner.  You take drugs by injection.  You are sexually active with a partner who has HIV.  Talk with your health care provider about whether you are at high risk of being infected with HIV. If you choose to begin PrEP, you should first be tested for HIV. You should then be tested every 3 months for as long as you are taking PrEP.  See your health care provider for regular screenings, exams, and tests for other STDs. Before having sex with a new partner, each of you should be screened for STDs and should talk about the results with each other. WHAT ARE THE BENEFITS OF SAFE SEX?   There is less chance of getting or giving an STD.  You can prevent unwanted or unintended pregnancies.  By discussing safe sex concerns with your partner, you may increase feelings of intimacy, comfort, trust, and honesty between the two of you.   This information is not intended to replace advice given to you by your health care provider. Make sure you discuss any questions you have with your health care provider.   Document Released: 10/13/2004 Document Revised: 09/26/2014 Document Reviewed: 02/27/2012 Elsevier Interactive Patient Education [employer].

## 2015-07-08 NOTE — Assessment & Plan Note (Signed)
She never started the medicine; she does not feel she needs it; that is fine with me and I told her I certainly was not upset with her for not starting the medicine; glad to hear she is doing better; she had a lot of questions concerning sexually transmitted diseases and I think that is where some of her anxiety stems

## 2015-07-08 NOTE — Assessment & Plan Note (Signed)
Request copy of pap, path, colpo reports; would recommend she have pap for follow-up based on those reports and current [facility] recommendations

## 2015-07-08 NOTE — Assessment & Plan Note (Signed)
Positive clue cells today, indicative of BV; she has this frequently and uses boric acid per GYN recommendations; explained dx today and encouraged to talk with GYN about other treatment options; she will start the boric acid treatment per their usual instructions after she finishes her period and then call them

## 2015-07-08 NOTE — Progress Notes (Signed)
BP 115/76 mmHg  Pulse 80  Temp(Src) 98.3 F (36.8 C)  Wt 141 lb (63.957 kg)  SpO2 98%  LMP 06/14/2015 (Approximate)   Subjective:    Patient ID: Cynthia Harrell, female    DOB: 02/08/1986, 29 y.o.   MRN: [id number]  HPI: Cynthia Harrell is a 29 y.o. female  Chief Complaint  Patient presents with  . Anxiety    She never started the Escitalopram but she does still have it. She felt like she was doing better.   . Vaginal Discharge    She thinks she might have BV   Patient is here for follow-up; she read about concerns about during the 3rd trimester; she feels better and feels okay since last visit; does not feel she needs it Appetite is good; playing softball this week, sleeping well  She thinks she has BV again; she is having a little bit of discharge; kind of watery, might be normal, not sure what's normal and what's not;  She has not had intercourse since July  A guy that she was talking to was tested for STDs, and he had non-gonococcal urethritis; he was treated; they have been talking to each other;  She had hx of chlamydia years ago, discovered on pap smear  She reports a hx of an abnormal pap smear; done at the health dept; they did what she describes to be a colposcopy and they said that was fine; her last pap smear here was December 26, 2013 and we reviewed that together; NIL  Relevant past medical, surgical, family and social history reviewed and updated as indicated. Interim medical history since our last visit reviewed. Allergies and medications reviewed and updated.  Review of Systems Per HPI unless specifically indicated above     Objective:    BP 115/76 mmHg  Pulse 80  Temp(Src) 98.3 F (36.8 C)  Wt 141 lb (63.957 kg)  SpO2 98%  LMP 06/14/2015 (Approximate)  Wt Readings from Last 3 Encounters:  07/08/15 141 lb (63.957 kg)  06/15/15 138 lb (62.596 kg)  06/09/15 137 lb 9.6 oz (62.415 kg)    Physical Exam  Constitutional: She appears well-developed and  well-nourished.  Eyes: EOM are normal. No scleral icterus.  Cardiovascular: Normal rate.   Pulmonary/Chest: Effort normal.  Abdominal: She exhibits no distension.  Genitourinary: There is no rash on the right labia. There is no rash on the left labia. There is bleeding (scant early bleeding consistent with starting menstrual period) in the vagina. No erythema or tenderness in the vagina. Vaginal discharge (scant menstrual-type discharge at the cervical os and fornix; no odor, no clumpy white discharge) found.  Skin: No lesion and no rash noted.  Psychiatric: She has a normal mood and affect. Her behavior is normal. Judgment and thought content normal.    Results for orders placed or performed in visit on 06/15/15  HIV antibody  Result Value Ref Range   HIV Screen 4th Generation wRfx Non Reactive Non Reactive  RPR  Result Value Ref Range   RPR Ser Ql Non Reactive Non Reactive  Hepatitis panel, acute  Result Value Ref Range   Hep A IgM Negative Negative   Hepatitis B Surface Ag Negative Negative   Hep B C IgM Negative Negative   Hep C Virus Ab <0.1 0.0 - 0.9 s/co ratio      Assessment & Plan:   Problem List Items Addressed This Visit      Genitourinary   Bacterial vaginosis  Positive clue cells today, indicative of BV; she has this frequently and uses boric acid per GYN recommendations; explained dx today and encouraged to talk with GYN about other treatment options; she will start the boric acid treatment per their usual instructions after she finishes her period and then call them        Other   High-risk sexual behavior    She wants to be smart and check for chlamydia, so she'll return to do an early morning void for testing; use condoms, be safe, smart partner selection      Relevant Orders   WET PREP FOR [place], YEAST, CLUE   Generalized anxiety disorder    She never started the medicine; she does not feel she needs it; that is fine with me and I told her I certainly  was not upset with her for not starting the medicine; glad to hear she is doing better; she had a lot of questions concerning sexually transmitted diseases and I think that is where some of her anxiety stems      Vaginal discharge - Primary    Wet mount collected today and it was positive for clue cells; see under BV section      Relevant Orders   WET PREP FOR [place], YEAST, CLUE   Hx of abnormal cervical Pap smear    Request copy of pap, path, colpo reports; would recommend she have pap for follow-up based on those reports and current [facility]P recommendations         Follow up plan: Return this week or next in the morning, for urine test.  Offered pap smear in the next month or so and recommend 3 negative paps before going to every 3 years, but I need to know what her prior pap and path reports were to really make educated, evidence-based recommendation [facility] staff to get records with path / pap report from health dept  Face-to-face time with patient was more than 15 minutes, >50% time spent counseling and coordination of care

## 2015-07-08 NOTE — Assessment & Plan Note (Signed)
Wet mount collected today and it was positive for clue cells; see under BV section

## 2015-07-08 NOTE — Assessment & Plan Note (Signed)
She wants to be smart and check for chlamydia, so she'll return to do an early morning void for testing; use condoms, be safe, smart partner selection

## 2015-07-09 ENCOUNTER — Other Ambulatory Visit: Payer: PRIVATE HEALTH INSURANCE

## 2015-07-09 DIAGNOSIS — Z7251 High risk heterosexual behavior: Secondary | ICD-10-CM

## 2015-07-11 ENCOUNTER — Other Ambulatory Visit: Payer: Self-pay | Admitting: Family Medicine

## 2015-07-11 LAB — GC/CHLAMYDIA PROBE AMP
Chlamydia trachomatis, NAA: NEGATIVE
Neisseria gonorrhoeae by PCR: NEGATIVE

## 2015-07-14 ENCOUNTER — Ambulatory Visit: Payer: PRIVATE HEALTH INSURANCE | Admitting: Family Medicine

## 2015-07-21 ENCOUNTER — Telehealth: Payer: Self-pay

## 2015-07-21 ENCOUNTER — Telehealth: Payer: Self-pay | Admitting: Obstetrics and Gynecology

## 2015-07-21 NOTE — Telephone Encounter (Signed)
PT CALLED AND IS HAVING CONCERNS ABOUT A BACTERIAL INFECTION, PT WANTED TO BE SEEN THIS WEEK BUT I TOLD HER [facility] IS OUT OF THE OFFICE AND SHE WANTED YOU TO CAL HER BACK.

## 2015-07-21 NOTE — Telephone Encounter (Signed)
Advised patient that we had not gotten the results from the health dept yet. She has an appt. With [place] for a CPE on Thursday and will go ahead and get pap done then.

## 2015-07-21 NOTE — Telephone Encounter (Signed)
Patient would like you to call her back to be sure that you have received her medical records regarding her previous pap smears. Please call.

## 2015-07-22 NOTE — Telephone Encounter (Signed)
Discussed pts sx she has appt with her PCP on 07/23/15 will have them do a swab for her BV

## 2015-08-03 ENCOUNTER — Telehealth: Payer: Self-pay | Admitting: Obstetrics and Gynecology

## 2015-08-03 NOTE — Telephone Encounter (Signed)
Pt needs rx for bacterial vaginosis.  / needs to talk about the boric acid

## 2015-08-04 ENCOUNTER — Encounter: Payer: Self-pay | Admitting: Physician Assistant

## 2015-08-04 ENCOUNTER — Ambulatory Visit: Payer: Self-pay | Admitting: Physician Assistant

## 2015-08-04 VITALS — BP 119/78 | HR 89 | Temp 98.9°F

## 2015-08-04 DIAGNOSIS — Z299 Encounter for prophylactic measures, unspecified: Secondary | ICD-10-CM

## 2015-08-04 DIAGNOSIS — J0300 Acute streptococcal tonsillitis, unspecified: Secondary | ICD-10-CM

## 2015-08-04 MED ORDER — FLUCONAZOLE 150 MG PO TABS: 150.0000 mg | ORAL_TABLET | Freq: Once | ORAL | Status: DC

## 2015-08-04 MED ORDER — PENICILLIN V POTASSIUM 500 MG PO TABS: 500.0000 mg | ORAL_TABLET | Freq: Four times a day (QID) | ORAL | Status: DC

## 2015-08-04 NOTE — Telephone Encounter (Signed)
[facility]- what to do with this pt???

## 2015-08-04 NOTE — Progress Notes (Signed)
S/ ST x 2 days , hurts to swallow, tender swollen glands, malaise , achy , No other  Complaints  O/ VSS  Mildly ill appearing , ENT 4+ enlarged tonsils with weepy  Drainage, no exudate Neck supple + tender ant cerv nodes heart rsr lungs clear  Rapid strep +    A/ strep tonsilitis   P /PCN 500mg  one qid x 10 days . chloroseptic , analgesics, cold fluids  diflucan .

## 2015-08-06 ENCOUNTER — Encounter: Payer: Self-pay | Admitting: Family Medicine

## 2015-08-06 ENCOUNTER — Ambulatory Visit (INDEPENDENT_AMBULATORY_CARE_PROVIDER_SITE_OTHER): Payer: PRIVATE HEALTH INSURANCE | Admitting: Family Medicine

## 2015-08-06 VITALS — Ht 66.5 in | Wt 140.0 lb

## 2015-08-06 DIAGNOSIS — N760 Acute vaginitis: Secondary | ICD-10-CM

## 2015-08-06 DIAGNOSIS — B9689 Other specified bacterial agents as the cause of diseases classified elsewhere: Secondary | ICD-10-CM

## 2015-08-06 DIAGNOSIS — J020 Streptococcal pharyngitis: Secondary | ICD-10-CM | POA: Diagnosis not present

## 2015-08-06 DIAGNOSIS — A499 Bacterial infection, unspecified: Secondary | ICD-10-CM | POA: Diagnosis not present

## 2015-08-06 MED ORDER — METRONIDAZOLE 500 MG PO TABS: 500.0000 mg | ORAL_TABLET | Freq: Two times a day (BID) | ORAL | Status: DC

## 2015-08-06 NOTE — Assessment & Plan Note (Signed)
Will use metronidazole treatment once she finishes her PCN

## 2015-08-06 NOTE — Assessment & Plan Note (Signed)
Currently on penicillin; cautioned about risk of C diff; start probiotic or take yogurt

## 2015-08-06 NOTE — Patient Instructions (Addendum)
Reschedule your physical for 2-3 weeks for now Please do eat yogurt daily or take a probiotic daily for the next month or two We want to replace the healthy germs in the gut If you notice foul, watery diarrhea in the next two months, schedule an appointment RIGHT AWAY Start the metronidazole after your finish the penicillin

## 2015-08-06 NOTE — Progress Notes (Signed)
   Ht 5' 6.5" (1.689 m)  Wt 140 lb (63.504 kg)  BMI 22.26 kg/m2  LMP 08/06/2015 (Exact Date)   Subjective:    Patient ID: Cynthia Harrell, female    DOB: [birth year], 29 y.o.   MRN: [id number]  HPI: Cynthia Harrell is a 29 y.o. female  Chief Complaint  Patient presents with  . Annual Exam    she started her period today, will r/s pap    She has strep throat; went to employee clinic at work; on penicillin  We'll reschedule the physical; she spotted on Monday and then started her regular period today; no pelvic pain  She thinks that she still has BV; she used the boric acid capsule a few days ago; she didn't want to put another one because she read about toxicity; she has done about 7 treatments or so, more than 7 she thinks; has the same fishy odor; having discharge, no itching; yellow discharge; wiping yellow, urine is really yellow on th penicillin; no unprotected intercourse  Relevant past medical, surgical, family and social history reviewed and updated as indicated. Interim medical history since our last visit reviewed. Allergies and medications reviewed and updated.  Review of Systems  Per HPI unless specifically indicated above     Objective:    Ht 5' 6.5" (1.689 m)  Wt 140 lb (63.504 kg)  BMI 22.26 kg/m2  LMP 08/06/2015 (Exact Date)  Wt Readings from Last 3 Encounters:  08/06/15 140 lb (63.504 kg)  07/08/15 141 lb (63.957 kg)  06/15/15 138 lb (62.596 kg)    Physical Exam  Constitutional: She appears well-developed and well-nourished. No distress.  Wearing mask  Eyes: EOM are normal. No scleral icterus.  Neck: No thyromegaly present.  Abdominal: She exhibits no distension.  Skin: No pallor.  Psychiatric: She has a normal mood and affect. Her behavior is normal. Judgment and thought content normal.    Results for orders placed or performed in visit on 07/09/15  GC/Chlamydia Probe Amp  Result Value Ref Range   Chlamydia trachomatis, NAA Negative Negative   Neisseria gonorrhoeae by PCR Negative Negative      Assessment & Plan:   Problem List Items Addressed This Visit      Respiratory   Strep throat    Currently on penicillin; cautioned about risk of C diff; start probiotic or take yogurt      Relevant Medications   metroNIDAZOLE (FLAGYL) 500 MG tablet     Genitourinary   Bacterial vaginosis - Primary    Will use metronidazole treatment once she finishes her PCN      Relevant Medications   metroNIDAZOLE (FLAGYL) 500 MG tablet      Follow up plan: No Follow-up on file.  Return in 2-3 weeks for physical Meds ordered this encounter  Medications  . metroNIDAZOLE (FLAGYL) 500 MG tablet    Sig: Take 1 tablet (500 mg total) by mouth 2 (two) times daily.    Dispense:  14 tablet    Refill:  0

## 2015-08-11 ENCOUNTER — Telehealth: Payer: Self-pay | Admitting: Physician Assistant

## 2015-08-20 NOTE — Telephone Encounter (Signed)
LM to call back to discuss prescription and answer questions about boric acid.

## 2015-08-24 ENCOUNTER — Ambulatory Visit (INDEPENDENT_AMBULATORY_CARE_PROVIDER_SITE_OTHER): Payer: PRIVATE HEALTH INSURANCE | Admitting: Family Medicine

## 2015-08-24 ENCOUNTER — Encounter: Payer: Self-pay | Admitting: Family Medicine

## 2015-08-24 VITALS — BP 115/76 | HR 83 | Temp 97.8°F | Ht 66.5 in | Wt 137.0 lb

## 2015-08-24 DIAGNOSIS — Z114 Encounter for screening for human immunodeficiency virus [HIV]: Secondary | ICD-10-CM | POA: Diagnosis not present

## 2015-08-24 DIAGNOSIS — Z7251 High risk heterosexual behavior: Secondary | ICD-10-CM

## 2015-08-24 DIAGNOSIS — B9689 Other specified bacterial agents as the cause of diseases classified elsewhere: Secondary | ICD-10-CM

## 2015-08-24 DIAGNOSIS — Z8742 Personal history of other diseases of the female genital tract: Secondary | ICD-10-CM

## 2015-08-24 DIAGNOSIS — B009 Herpesviral infection, unspecified: Secondary | ICD-10-CM

## 2015-08-24 DIAGNOSIS — Z124 Encounter for screening for malignant neoplasm of cervix: Secondary | ICD-10-CM

## 2015-08-24 DIAGNOSIS — N760 Acute vaginitis: Secondary | ICD-10-CM

## 2015-08-24 DIAGNOSIS — Z0000 Encounter for general adult medical examination without abnormal findings: Secondary | ICD-10-CM

## 2015-08-24 NOTE — Patient Instructions (Addendum)
Try taking L-lysine or B complex vitamins We'll let you know your lab results  Health Maintenance, Female Adopting a healthy lifestyle and getting preventive care can go a long way to promote health and wellness. Talk with your health care provider about what schedule of regular examinations is right for you. This is a good chance for you to check in with your provider about disease prevention and staying healthy. In between checkups, there are plenty of things you can do on your own. Experts have done a lot of research about which lifestyle changes and preventive measures are most likely to keep you healthy. Ask your health care provider for more information. WEIGHT AND DIET  Eat a healthy diet  Be sure to include plenty of vegetables, fruits, low-fat dairy products, and lean protein.  Do not eat a lot of foods high in solid fats, added sugars, or salt.  Get regular exercise. This is one of the most important things you can do for your health.  Most adults should exercise for at least 150 minutes each week. The exercise should increase your heart rate and make you sweat (moderate-intensity exercise).  Most adults should also do strengthening exercises at least twice a week. This is in addition to the moderate-intensity exercise.  Maintain a healthy weight  Body mass index (BMI) is a measurement that can be used to identify possible weight problems. It estimates body fat based on height and weight. Your health care provider can help determine your BMI and help you achieve or maintain a healthy weight.  For females 16 years of age and older:   A BMI below 18.5 is considered underweight.  A BMI of 18.5 to 24.9 is normal.  A BMI of 25 to 29.9 is considered overweight.  A BMI of 30 and above is considered obese.  Watch levels of cholesterol and blood lipids  You should start having your blood tested for lipids and cholesterol at 28 years of age, then have this test every 5  years.  You may need to have your cholesterol levels checked more often if:  Your lipid or cholesterol levels are high.  You are older than 27 years of age.  You are at high risk for heart disease.  CANCER SCREENING   Lung Cancer  Lung cancer screening is recommended for adults 25-55 years old who are at high risk for lung cancer because of a history of smoking.  A yearly low-dose CT scan of the lungs is recommended for people who:  Currently smoke.  Have quit within the past 15 years.  Have at least a 30-pack-year history of smoking. A pack year is smoking an average of one pack of cigarettes a day for 1 year.  Yearly screening should continue until it has been 15 years since you quit.  Yearly screening should stop if you develop a health problem that would prevent you from having lung cancer treatment.  Breast Cancer  Practice breast self-awareness. This means understanding how your breasts normally appear and feel.  It also means doing regular breast self-exams. Let your health care provider know about any changes, no matter how small.  If you are in your 20s or 30s, you should have a clinical breast exam (CBE) by a health care provider every 1-3 years as part of a regular health exam.  If you are 31 or older, have a CBE every year. Also consider having a breast X-ray (mammogram) every year.  If you have a family history  of breast cancer, talk to your health care provider about genetic screening.  If you are at high risk for breast cancer, talk to your health care provider about having an MRI and a mammogram every year.  Breast cancer gene (BRCA) assessment is recommended for women who have family members with BRCA-related cancers. BRCA-related cancers include:  Breast.  Ovarian.  Tubal.  Peritoneal cancers.  Results of the assessment will determine the need for genetic counseling and BRCA1 and BRCA2 testing. Cervical Cancer Your health care provider may  recommend that you be screened regularly for cancer of the pelvic organs (ovaries, uterus, and vagina). This screening involves a pelvic examination, including checking for microscopic changes to the surface of your cervix (Pap test). You may be encouraged to have this screening done every 3 years, beginning at age 20.  For women ages 69-65, health care providers may recommend pelvic exams and Pap testing every 3 years, or they may recommend the Pap and pelvic exam, combined with testing for human papilloma virus (HPV), every 5 years. Some types of HPV increase your risk of cervical cancer. Testing for HPV may also be done on women of any age with unclear Pap test results.  Other health care providers may not recommend any screening for nonpregnant women who are considered low risk for pelvic cancer and who do not have symptoms. Ask your health care provider if a screening pelvic exam is right for you.  If you have had past treatment for cervical cancer or a condition that could lead to cancer, you need Pap tests and screening for cancer for at least 20 years after your treatment. If Pap tests have been discontinued, your risk factors (such as having a new sexual partner) need to be reassessed to determine if screening should resume. Some women have medical problems that increase the chance of getting cervical cancer. In these cases, your health care provider may recommend more frequent screening and Pap tests. Colorectal Cancer  This type of cancer can be detected and often prevented.  Routine colorectal cancer screening usually begins at 27 years of age and continues through 27 years of age.  Your health care provider may recommend screening at an earlier age if you have risk factors for colon cancer.  Your health care provider may also recommend using home test kits to check for hidden blood in the stool.  A small camera at the end of a tube can be used to examine your colon directly  (sigmoidoscopy or colonoscopy). This is done to check for the earliest forms of colorectal cancer.  Routine screening usually begins at age 54.  Direct examination of the colon should be repeated every 5-10 years through 27 years of age. However, you may need to be screened more often if early forms of precancerous polyps or small growths are found. Skin Cancer  Check your skin from head to toe regularly.  Tell your health care provider about any new moles or changes in moles, especially if there is a change in a mole's shape or color.  Also tell your health care provider if you have a mole that is larger than the size of a pencil eraser.  Always use sunscreen. Apply sunscreen liberally and repeatedly throughout the day.  Protect yourself by wearing long sleeves, pants, a wide-brimmed hat, and sunglasses whenever you are outside. HEART DISEASE, DIABETES, AND HIGH BLOOD PRESSURE   High blood pressure causes heart disease and increases the risk of stroke. High blood pressure is  more likely to develop in:  People who have blood pressure in the high end of the normal range (130-139/85-89 mm Hg).  People who are overweight or obese.  People who are African American.  If you are 56-37 years of age, have your blood pressure checked every 3-5 years. If you are 35 years of age or older, have your blood pressure checked every year. You should have your blood pressure measured twice--once when you are at a hospital or clinic, and once when you are not at a hospital or clinic. Record the average of the two measurements. To check your blood pressure when you are not at a hospital or clinic, you can use:  An automated blood pressure machine at a pharmacy.  A home blood pressure monitor.  If you are between 43 years and 20 years old, ask your health care provider if you should take aspirin to prevent strokes.  Have regular diabetes screenings. This involves taking a blood sample to check your  fasting blood sugar level.  If you are at a normal weight and have a low risk for diabetes, have this test once every three years after 27 years of age.  If you are overweight and have a high risk for diabetes, consider being tested at a younger age or more often. PREVENTING INFECTION  Hepatitis B  If you have a higher risk for hepatitis B, you should be screened for this virus. You are considered at high risk for hepatitis B if:  You were born in a country where hepatitis B is common. Ask your health care provider which countries are considered high risk.  Your parents were born in a high-risk country, and you have not been immunized against hepatitis B (hepatitis B vaccine).  You have HIV or AIDS.  You use needles to inject street drugs.  You live with someone who has hepatitis B.  You have had sex with someone who has hepatitis B.  You get hemodialysis treatment.  You take certain medicines for conditions, including cancer, organ transplantation, and autoimmune conditions. Hepatitis C  Blood testing is recommended for:  Everyone born from [birth year] through 1963.  Anyone with known risk factors for hepatitis C. Sexually transmitted infections (STIs)  You should be screened for sexually transmitted infections (STIs) including gonorrhea and chlamydia if:  You are sexually active and are younger than 27 years of age.  You are older than 27 years of age and your health care provider tells you that you are at risk for this type of infection.  Your sexual activity has changed since you were last screened and you are at an increased risk for chlamydia or gonorrhea. Ask your health care provider if you are at risk.  If you do not have HIV, but are at risk, it may be recommended that you take a prescription medicine daily to prevent HIV infection. This is called pre-exposure prophylaxis (PrEP). You are considered at risk if:  You are sexually active and do not regularly use condoms or  know the HIV status of your partner(s).  You take drugs by injection.  You are sexually active with a partner who has HIV. Talk with your health care provider about whether you are at high risk of being infected with HIV. If you choose to begin PrEP, you should first be tested for HIV. You should then be tested every 3 months for as long as you are taking PrEP.  PREGNANCY   If you are premenopausal and you  may become pregnant, ask your health care provider about preconception counseling.  If you may become pregnant, take 400 to 800 micrograms (mcg) of folic acid every day.  If you want to prevent pregnancy, talk to your health care provider about birth control (contraception). OSTEOPOROSIS AND MENOPAUSE   Osteoporosis is a disease in which the bones lose minerals and strength with aging. This can result in serious bone fractures. Your risk for osteoporosis can be identified using a bone density scan.  If you are 6 years of age or older, or if you are at risk for osteoporosis and fractures, ask your health care provider if you should be screened.  Ask your health care provider whether you should take a calcium or vitamin D supplement to lower your risk for osteoporosis.  Menopause may have certain physical symptoms and risks.  Hormone replacement therapy may reduce some of these symptoms and risks. Talk to your health care provider about whether hormone replacement therapy is right for you.  HOME CARE INSTRUCTIONS   Schedule regular health, dental, and eye exams.  Stay current with your immunizations.   Do not use any tobacco products including cigarettes, chewing tobacco, or electronic cigarettes.  If you are pregnant, do not drink alcohol.  If you are breastfeeding, limit how much and how often you drink alcohol.  Limit alcohol intake to no more than 1 drink per day for nonpregnant women. One drink equals 12 ounces of beer, 5 ounces of wine, or 1 ounces of hard liquor.  Do  not use street drugs.  Do not share needles.  Ask your health care provider for help if you need support or information about quitting drugs.  Tell your health care provider if you often feel depressed.  Tell your health care provider if you have ever been abused or do not feel safe at home.   This information is not intended to replace advice given to you by your health care provider. Make sure you discuss any questions you have with your health care provider.   Document Released: 03/21/2011 Document Revised: 09/26/2014 Document Reviewed: 08/07/2013 Elsevier Interactive Patient Education [employer].

## 2015-08-24 NOTE — Progress Notes (Signed)
BP 115/76 mmHg  Pulse 83  Temp(Src) 97.8 F (36.6 C)  Ht 5' 6.5" (1.689 m)  Wt 137 lb (62.143 kg)  BMI 21.78 kg/m2  SpO2 96%  LMP 08/06/2015 (Exact Date)   Subjective:    Patient ID: Cynthia Harrell, female    DOB: 10/30/1985, 28 y.o.   MRN: [id number]  HPI: Cynthia Harrell is a 29 y.o. female  Chief Complaint  Patient presents with  . Annual Exam    she is not due for a pap, but would like to see if you think she needs one.   Since last visit: She had strep and was treated with penicillin She has had numerous infections of bacterial vaginosis and has been treated by GYN with boric acid several times; she read about potential toxicity, so she called them; they (GYN) called her back but she hasn't actually spoken with them yet, they just returned her call and they keep missing each other; patient was concerned about the boric acid issues She took the metronidazole most recently and it seems to have cleared up  Caught a head cold; still coughing, every time she laughs especially; taking mucinex DM; exercise did make her cough some too; no sore throat; ears are okay; no rash; no travel anywhere  USPSTF grade A and B recommendations Alcohol: not excessive Depression:  Depression screen [facility] 2/9 08/24/2015 08/06/2015 06/15/2015  Decreased Interest 1 0 3  Down, Depressed, Hopeless 0 0 1  PHQ - 2 Score 1 0 4  Altered sleeping - - 1  Tired, decreased energy - - 1  Change in appetite - - 1  Feeling bad or failure about yourself  - - 1  Trouble concentrating - - 0  Moving slowly or fidgety/restless - - 0  Suicidal thoughts - - 0  PHQ-9 Score - - 8   Hypertension: great today Obesity: lost a few pounds Tobacco use: nonsmoker HIV, hep B, hep C: discussed; several done not long ago; no intercourse since July STD testing and prevention (chl/gon/syphilis): done recently Lipids: check today Glucose: check today Colorectal cancer: no family hx 1st degree Breast cancer: no lumps or  bumps BRCA gene screening: no breast or ovarian cancer Intimate partner violence: no abuse Cervical cancer screening:  Lung cancer: n/a Osteoporosis: n/a Fall prevention/vitamin D: out in the sun 15-20 minutes a day AAA: n/a Aspirin: n/a Diet: typical American Exercise: regular exercise Skin cancer: mother had melanoma; not using tanning beds; sunless tanner  Relevant past medical, surgical, family and social history reviewed and updated as indicated. Interim medical history since our last visit reviewed. Allergies and medications reviewed and updated.  Review of Systems  Constitutional: Negative for fever and chills.  HENT: Positive for sore throat (but better now). Negative for rhinorrhea.   Respiratory: Positive for cough. Negative for shortness of breath.   Endocrine: Positive for polydipsia and polyuria.  Genitourinary:       Thinks she is having an outbreak right now of herpes, a little stressed out; might get hardly none, 1-2 outbreaks a year, but this year more, maybe 3 since July  Hematological: Does not bruise/bleed easily.  Psychiatric/Behavioral: Negative for dysphoric mood.   Per HPI unless specifically indicated above     Objective:    BP 115/76 mmHg  Pulse 83  Temp(Src) 97.8 F (36.6 C)  Ht 5' 6.5" (1.689 m)  Wt 137 lb (62.143 kg)  BMI 21.78 kg/m2  SpO2 96%  LMP 08/06/2015 (Exact Date)  Wt  Readings from Last 3 Encounters:  08/24/15 137 lb (62.143 kg)  08/06/15 140 lb (63.504 kg)  07/08/15 141 lb (63.957 kg)    Physical Exam  Constitutional: She appears well-developed and well-nourished. No distress.  HENT:  Head: Normocephalic and atraumatic.  Eyes: Conjunctivae and EOM are normal. Right eye exhibits no hordeolum. Left eye exhibits no hordeolum. No scleral icterus.  Neck: Carotid bruit is not present. No thyromegaly present.  Cardiovascular: Normal rate, regular rhythm, S1 normal, S2 normal and normal heart sounds.   No extrasystoles are present.   Pulmonary/Chest: Effort normal and breath sounds normal. No respiratory distress. Right breast exhibits no inverted nipple, no mass, no nipple discharge, no skin change and no tenderness. Left breast exhibits no inverted nipple, no mass, no nipple discharge, no skin change and no tenderness. Breasts are symmetrical.  Abdominal: Soft. Normal appearance and bowel sounds are normal. She exhibits no distension, no abdominal bruit, no pulsatile midline mass and no mass. There is no hepatosplenomegaly. There is no tenderness. No hernia.  Genitourinary: Uterus normal. Pelvic exam was performed with patient prone. There is no rash or lesion on the right labia. There is no rash or lesion on the left labia. Cervix exhibits no motion tenderness. Right adnexum displays no mass, no tenderness and no fullness. Left adnexum displays no mass, no tenderness and no fullness.  Musculoskeletal: Normal range of motion. She exhibits no edema.  Lymphadenopathy:       Head (right side): No submandibular adenopathy present.       Head (left side): No submandibular adenopathy present.    She has no cervical adenopathy.    She has no axillary adenopathy.  Neurological: She is alert. She displays no tremor. No cranial nerve deficit. She exhibits normal muscle tone. Gait normal.  Skin: Skin is warm and dry. No bruising and no ecchymosis noted. No cyanosis. No pallor.  Psychiatric: Her speech is normal and behavior is normal. Thought content normal. Her mood appears not anxious. She does not exhibit a depressed mood.      Assessment & Plan:   Problem List Items Addressed This Visit      Genitourinary   Bacterial vaginosis    Frequent infections; she will talk to gyn about the boric acid issue        Other   Herpes simplex without complication    Discussed passage; virus can actually be shed without active lesion, though less common; should not be able to be spread from just touching intact epidermis on the hips or  buttocks, however, usually through mucous membranes, though partner can pick up lesion (such as herpetic whitlow that healthcare providers can get)      High-risk sexual behavior    I am so glad the patient wants to be tested and be informed to protect herself and the health of her partner(s); STD testing done today; safe sex practices encouraged      Hx of abnormal cervical Pap smear    Will recheck pap today      Screening for HIV (human immunodeficiency virus)    So glad patient wants to be tested and informed to protect herself and the health of her partner(s)      Relevant Orders   HIV antibody (Completed)   Preventative health care - Primary    USPSTF grade A and B recommendations reviewed with patient; age-appropriate recommendations, preventive care, screening tests, etc discussed and encouraged; healthy living encouraged; see AVS for patient education given to  patient      Relevant Orders   Hepatitis B surface antigen (Completed)   Hepatitis C antibody (Completed)   Comprehensive metabolic panel (Completed)   Lipid Panel w/o Chol/HDL Ratio (Completed)   TSH (Completed)   Cervical cancer screening    Thin prep collected today      Relevant Orders   Pap Lb, rfx HPV ASCU      Follow up plan: Return in about 1 year (around 08/23/2016) for complete physical.  An after-visit summary was printed and given to the patient at [facility].  Please see the patient instructions which may contain other information and recommendations beyond what is mentioned above in the assessment and plan.  Orders Placed This Encounter  Procedures  . HIV antibody  . Hepatitis B surface antigen  . Hepatitis C antibody  . Comprehensive metabolic panel  . Lipid Panel w/o Chol/HDL Ratio  . TSH

## 2015-08-25 ENCOUNTER — Telehealth: Payer: Self-pay

## 2015-08-25 LAB — COMPREHENSIVE METABOLIC PANEL
ALT: 22 IU/L (ref 0–32)
AST: 20 IU/L (ref 0–40)
Albumin/Globulin Ratio: 1.8 (ref 1.1–2.5)
Albumin: 4.5 g/dL (ref 3.5–5.5)
Alkaline Phosphatase: 59 IU/L (ref 39–117)
BUN/Creatinine Ratio: 11 (ref 8–20)
BUN: 8 mg/dL (ref 6–20)
Bilirubin Total: 0.5 mg/dL (ref 0.0–1.2)
CALCIUM: 9.6 mg/dL (ref 8.7–10.2)
CO2: 29 mmol/L (ref 18–29)
CREATININE: 0.76 mg/dL (ref 0.57–1.00)
Chloride: 99 mmol/L (ref 97–106)
GFR, EST AFRICAN AMERICAN: 124 mL/min/{1.73_m2} (ref >59)
GFR, EST NON AFRICAN AMERICAN: 108 mL/min/{1.73_m2} (ref >59)
GLUCOSE: 97 mg/dL (ref 65–99)
Globulin, Total: 2.5 g/dL (ref 1.5–4.5)
POTASSIUM: 4.4 mmol/L (ref 3.5–5.2)
Sodium: 139 mmol/L (ref 136–144)
TOTAL PROTEIN: 7 g/dL (ref 6.0–8.5)

## 2015-08-25 LAB — TSH: TSH: 0.706 u[IU]/mL (ref 0.450–4.500)

## 2015-08-25 LAB — LIPID PANEL W/O CHOL/HDL RATIO
Cholesterol, Total: 154 mg/dL (ref 100–199)
HDL: 70 mg/dL (ref >39)
LDL Calculated: 74 mg/dL (ref 0–99)
TRIGLYCERIDES: 48 mg/dL (ref 0–149)
VLDL CHOLESTEROL CAL: 10 mg/dL (ref 5–40)

## 2015-08-25 LAB — HEPATITIS B SURFACE ANTIGEN: Hepatitis B Surface Ag: NEGATIVE

## 2015-08-25 LAB — HIV ANTIBODY (ROUTINE TESTING W REFLEX): HIV Screen 4th Generation wRfx: NONREACTIVE

## 2015-08-25 LAB — HEPATITIS C ANTIBODY: Hep C Virus Ab: <0.1 s/co ratio (ref 0.0–0.9)

## 2015-08-25 NOTE — Telephone Encounter (Signed)
Patient returned my phone call.  Patient notified of results.

## 2015-08-25 NOTE — Telephone Encounter (Signed)
Called patient and no answer. Left VM for patient to return my call.

## 2015-08-25 NOTE — Telephone Encounter (Signed)
-----   Message from Arnetha Courser, MD sent at 08/25/2015 12:12 PM EST ----- Please give patient the good news that ALL of her labs look really great; nothing worrisome, all negative

## 2015-08-28 ENCOUNTER — Ambulatory Visit: Payer: Self-pay | Admitting: Physician Assistant

## 2015-08-28 ENCOUNTER — Encounter: Payer: Self-pay | Admitting: Physician Assistant

## 2015-08-28 VITALS — BP 110/60 | HR 75 | Temp 98.2°F

## 2015-08-28 DIAGNOSIS — J069 Acute upper respiratory infection, unspecified: Secondary | ICD-10-CM

## 2015-08-28 MED ORDER — AZITHROMYCIN 250 MG PO TABS: ORAL_TABLET | ORAL | Status: DC

## 2015-08-28 MED ORDER — BENZONATATE 200 MG PO CAPS: 200.0000 mg | ORAL_CAPSULE | Freq: Three times a day (TID) | ORAL | Status: DC | PRN

## 2015-08-28 MED ORDER — ALBUTEROL SULFATE HFA 108 (90 BASE) MCG/ACT IN AERS: 2.0000 | INHALATION_SPRAY | Freq: Four times a day (QID) | RESPIRATORY_TRACT | Status: DC | PRN

## 2015-08-28 NOTE — Progress Notes (Signed)
S: C/o runny nose and congestion for 3 weeks, cough is worse, had strep prior to thanksgiving has been coughing since, no fever, chills, cp/sob, v/d; mucus was green this am but clear throughout the day, cough is sporadic, finished pcn rx  Using otc meds: mucinex  O: PE: vitals wnl, nad, perrl eomi, normocephalic, tms dull, nasal mucosa red and swollen, throat injected, tonsils swollen, neck supple no lymph, lungs c t a, cv rrr, neuro intact, cough is dry  A:  Acute uri   P: zpack, tessalon, albuterol inhaler, drink fluids, continue regular meds , use otc meds of choice, return if not improving in 5 days, return earlier if worsening , consider cxr if not better by wed

## 2015-08-29 DIAGNOSIS — Z0000 Encounter for general adult medical examination without abnormal findings: Secondary | ICD-10-CM | POA: Insufficient documentation

## 2015-08-29 DIAGNOSIS — Z124 Encounter for screening for malignant neoplasm of cervix: Secondary | ICD-10-CM | POA: Insufficient documentation

## 2015-08-29 NOTE — Assessment & Plan Note (Signed)
Will recheck pap today

## 2015-08-29 NOTE — Assessment & Plan Note (Signed)
I am so glad the patient wants to be tested and be informed to protect herself and the health of her partner(s); STD testing done today; safe sex practices encouraged

## 2015-08-29 NOTE — Assessment & Plan Note (Signed)
So glad patient wants to be tested and informed to protect herself and the health of her partner(s)

## 2015-08-29 NOTE — Assessment & Plan Note (Signed)
Thin prep collected today 

## 2015-08-29 NOTE — Assessment & Plan Note (Signed)
USPSTF grade A and B recommendations reviewed with patient; age-appropriate recommendations, preventive care, screening tests, etc discussed and encouraged; healthy living encouraged; see AVS for patient education given to patient  

## 2015-08-29 NOTE — Assessment & Plan Note (Signed)
Frequent infections; she will talk to gyn about the boric acid issue

## 2015-08-29 NOTE — Assessment & Plan Note (Signed)
Discussed passage; virus can actually be shed without active lesion, though less common; should not be able to be spread from just touching intact epidermis on the hips or buttocks, however, usually through mucous membranes, though partner can pick up lesion (such as herpetic whitlow that healthcare providers can get)

## 2015-09-03 ENCOUNTER — Telehealth: Payer: Self-pay | Admitting: Emergency Medicine

## 2015-09-03 DIAGNOSIS — B379 Candidiasis, unspecified: Secondary | ICD-10-CM

## 2015-09-03 MED ORDER — FLUCONAZOLE 150 MG PO TABS: 150.0000 mg | ORAL_TABLET | Freq: Once | ORAL | Status: DC

## 2015-09-03 NOTE — Telephone Encounter (Signed)
Approved diflucan

## 2015-09-03 NOTE — Telephone Encounter (Signed)
Patient was seen last week and was put on an antibiotic (Zpack). She expressed that she has developed a yeast infection and wants to know if we can call in Diflucan in to [facility] [address].

## 2015-09-04 LAB — PAP LB, RFX HPV ASCU: PAP SMEAR COMMENT: 0

## 2015-09-04 LAB — HPV DNA PROBE HIGH RISK, AMPLIFIED: HPV, HIGH-RISK: NEGATIVE

## 2015-09-15 ENCOUNTER — Encounter: Payer: Self-pay | Admitting: Certified Nurse Midwife

## 2015-09-15 ENCOUNTER — Ambulatory Visit (INDEPENDENT_AMBULATORY_CARE_PROVIDER_SITE_OTHER): Payer: PRIVATE HEALTH INSURANCE | Admitting: Certified Nurse Midwife

## 2015-09-15 VITALS — BP 107/70 | HR 69 | Ht 66.6 in | Wt 140.5 lb

## 2015-09-15 DIAGNOSIS — N760 Acute vaginitis: Secondary | ICD-10-CM

## 2015-09-15 DIAGNOSIS — A6000 Herpesviral infection of urogenital system, unspecified: Secondary | ICD-10-CM

## 2015-09-15 DIAGNOSIS — N898 Other specified noninflammatory disorders of vagina: Secondary | ICD-10-CM

## 2015-09-15 MED ORDER — ACYCLOVIR 400 MG PO TABS: 400.0000 mg | ORAL_TABLET | Freq: Two times a day (BID) | ORAL | Status: DC

## 2015-09-15 NOTE — Progress Notes (Signed)
Patient ID: Cynthia Harrell, female   DOB: [birth year], 29 y.o.   MRN: [id number] Would like to know what the next step is related to BV because it is a recurrent problem.  Current discharge white, no odor. Does want to use  Boric acid any more. Questions about a probiotic supp. She has heard about.

## 2015-09-15 NOTE — Patient Instructions (Signed)
  Place vaginitis patient instructions here.  

## 2015-09-15 NOTE — Progress Notes (Signed)
  Subjective:    Cynthia Harrell is a 29 y.o. female who presents for sexually transmitted disease check. Sexual history reviewed with the patient. STI Exposure: denies knowledge of risky exposure and has not had intercourse since July 2016. Previous history of STI HSV. Current symptoms vaginal discharge: scant and white, skin lesions in perineal region. Contraception: condoms Menstrual History: OB History    Gravida Para Term Preterm AB TAB SAB Ectopic Multiple Living   1    1 1            Patient's last menstrual period was 09/06/2015 (approximate).    The following portions of the patient's history were reviewed and updated as appropriate: allergies, current medications, past family history, past medical history, past social history, past surgical history and problem list.  Review of Systems Pertinent items are noted in HPI.    Objective:    BP 107/70 mmHg  Pulse 69  Ht 5' 6.6" (1.692 m)  Wt 140 lb 8 oz (63.73 kg)  BMI 22.26 kg/m2  LMP 09/06/2015 (Approximate) General:   alert, cooperative and appears stated age  Lymph Nodes:   Cervical, supraclavicular, and axillary nodes normal.  Pelvis:  Vulva and vagina appear normal. Bimanual exam reveals normal uterus and adnexa. 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 RTC PRN

## 2015-09-22 ENCOUNTER — Telehealth: Payer: Self-pay | Admitting: Family Medicine

## 2015-09-22 NOTE — Telephone Encounter (Signed)
Let patient know last year's was fine; this year's had atypical squamous cells of undetermined significance; not a major worry; we'll just repeat her pap smear in one year

## 2015-09-22 NOTE — Telephone Encounter (Signed)
-----   Message from Staci Acosta, [place] sent at 09/07/2015 11:58 AM EST ----- Her previous one was done here, I printed it out and put it in your blue box. Her GYN is also faxing [place] her last one they have.

## 2015-09-23 ENCOUNTER — Telehealth: Payer: Self-pay

## 2015-09-23 NOTE — Telephone Encounter (Signed)
She went to her GYN and they gave her Acyclovir 400mg  take 1 BID. She had been taking the Valacyclovir 500mg . They told her that they didn't think the Valacyclovir was strong enough for her anymore. She is afraid she will forget to take the Acyclovir BID. She wants to know if either med has a higher dosage so that she only has to take it once a day. I advised her to call her GYN and ask them, but she trusts you and wanted to know your thoughts.

## 2015-09-23 NOTE — Telephone Encounter (Signed)
Patient notified

## 2015-09-25 NOTE — Telephone Encounter (Signed)
She can use valacyclovir 1 gram once a day; she can ask her GYN if they'll prescribe that for her

## 2015-09-25 NOTE — Telephone Encounter (Signed)
Left message to call.

## 2015-09-29 NOTE — Telephone Encounter (Signed)
Patient notified

## 2015-11-25 ENCOUNTER — Telehealth: Payer: Self-pay | Admitting: Obstetrics and Gynecology

## 2015-11-25 NOTE — Telephone Encounter (Signed)
Pt called again . Wants to ask a quick question about a RX

## 2015-11-25 NOTE — Telephone Encounter (Signed)
Spoke with pt we discussed her acyclovir

## 2015-12-09 ENCOUNTER — Encounter: Payer: Self-pay | Admitting: Obstetrics and Gynecology

## 2015-12-09 ENCOUNTER — Ambulatory Visit (INDEPENDENT_AMBULATORY_CARE_PROVIDER_SITE_OTHER): Payer: Managed Care, Other (non HMO) | Admitting: Obstetrics and Gynecology

## 2015-12-09 VITALS — BP 104/76 | HR 79 | Ht 66.0 in | Wt 143.1 lb

## 2015-12-09 DIAGNOSIS — A6000 Herpesviral infection of urogenital system, unspecified: Secondary | ICD-10-CM

## 2015-12-09 MED ORDER — VALACYCLOVIR HCL 500 MG PO TABS: 500.0000 mg | ORAL_TABLET | Freq: Two times a day (BID) | ORAL | Status: DC

## 2015-12-09 NOTE — Progress Notes (Signed)
Subjective:     Patient ID: Cynthia Harrell, female   DOB: 27-Dec-1985, 30 y.o.   MRN: [id number]  HPI Slight increase in vaginal discharge x 6 days, without odor or itching, did report HSV outbreak after menses at first of the month. Desires switching HSV prophylaxis back to Valtrex as it worked better for her. No sexual activity x 8 months.  Review of Systems See above    Objective:   Physical Exam A&O x4  well groomed female in no distress Blood pressure 104/76, pulse 79, height 5\' 6"  (1.676 m), weight 143 lb 1.6 oz (64.91 kg), last menstrual period 11/18/2015. Pelvic exam: normal external genitalia, vulva, vagina, cervix, uterus and adnexa, WET MOUNT done - results: negative for pathogens, normal epithelial cells.    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 Meds changed to Valtrex 500mg  daily, to increase to bid x 3-5d if outbreak occurs.  Lyle Niblett [place], CNM

## 2016-03-10 ENCOUNTER — Other Ambulatory Visit (INDEPENDENT_AMBULATORY_CARE_PROVIDER_SITE_OTHER): Payer: Managed Care, Other (non HMO) | Admitting: Obstetrics and Gynecology

## 2016-03-10 ENCOUNTER — Telehealth: Payer: Self-pay | Admitting: Obstetrics and Gynecology

## 2016-03-10 VITALS — BP 119/77 | HR 89 | Wt 144.6 lb

## 2016-03-10 DIAGNOSIS — B379 Candidiasis, unspecified: Secondary | ICD-10-CM | POA: Diagnosis not present

## 2016-03-10 DIAGNOSIS — R350 Frequency of micturition: Secondary | ICD-10-CM | POA: Diagnosis not present

## 2016-03-10 LAB — POCT URINALYSIS DIPSTICK
BILIRUBIN UA: NEGATIVE
Glucose, UA: NEGATIVE
Ketones, UA: NEGATIVE
NITRITE UA: NEGATIVE
Protein, UA: NEGATIVE
Spec Grav, UA: 1.015
Urobilinogen, UA: NEGATIVE
pH, UA: 6

## 2016-03-10 MED ORDER — FLUCONAZOLE 150 MG PO TABS: 150.0000 mg | ORAL_TABLET | Freq: Once | ORAL | Status: DC

## 2016-03-10 MED ORDER — SULFAMETHOXAZOLE-TRIMETHOPRIM 800-160 MG PO TABS: 1.0000 | ORAL_TABLET | Freq: Two times a day (BID) | ORAL | Status: DC

## 2016-03-10 NOTE — Telephone Encounter (Signed)
SOMETIMES WHEN SHE GOES TO THE BATHROOM SHE FEELS LIKE SHE NEEDS TO GO AGAIN, DISCHARGE// H/O BACTERIA VAGINOSIS. DOES SHE NEED TO COME IN FOR URINE TEST OR BE WORKED IN

## 2016-03-10 NOTE — Telephone Encounter (Signed)
Pt is coming 03/10/16

## 2016-03-10 NOTE — Progress Notes (Signed)
Patient ID: Cynthia Harrell, female   DOB: [birth year], 30 y.o.   MRN: [id number]  Pt had called and spoke with [facility] nurse about a possible UTI and vaginal itching. Has slight vaginal discharge but not any different than usual at this time. Also pt asked me to look at her upper lip on left side and wanted to know if this was a fever blister. Pt has a tooth that is rubbing this area which is slightly open. Pt states she noticed this on Sunday. No pain unless tooth rubs it. I told pt that fever blisters usually start out painful, red, and with swelling then blister. None of these symptoms were visible and nor had they been per pt. She is to get braces soon. U/A and U/C obtained. Urinalysis showed non-hemolyzed trace of blood and small leuks. Pt c/o frequent urination, mild external itching, and states she has h/o BV.  Pt done self internal swab for test for BV. Bactrim DS 1 tab. Po bid x3d rx sent to pharmacy and Diflucan 150mg  po x1.

## 2016-03-12 LAB — URINE CULTURE

## 2016-03-16 ENCOUNTER — Telehealth: Payer: Self-pay | Admitting: Obstetrics and Gynecology

## 2016-03-16 NOTE — Telephone Encounter (Signed)
This pt called and said she came last week for urine drop off and was given some medication. She hasn't gotten a call about her results and wondered if she needed a different medication b/c she is still having symptoms.she said she saw Martie Lee

## 2016-03-17 NOTE — Telephone Encounter (Signed)
U/C negative. Pt's number does not have voice mail box set up.

## 2016-03-18 ENCOUNTER — Other Ambulatory Visit: Payer: Self-pay | Admitting: Obstetrics and Gynecology

## 2016-03-18 ENCOUNTER — Encounter: Payer: Self-pay | Admitting: Obstetrics and Gynecology

## 2016-03-18 DIAGNOSIS — A493 Mycoplasma infection, unspecified site: Secondary | ICD-10-CM

## 2016-03-18 MED ORDER — MOXIFLOXACIN HCL 400 MG PO TABS: 400.0000 mg | ORAL_TABLET | Freq: Every day | ORAL | Status: DC

## 2016-03-18 NOTE — Telephone Encounter (Signed)
Pt was notified by Willodean Rosenthal

## 2016-03-18 NOTE — Telephone Encounter (Signed)
-----   Message from Joylene Igo, [place] sent at 03/12/2016  9:48 PM EDT ----- Please let her know her culture was negative

## 2016-03-18 NOTE — Telephone Encounter (Signed)
Pt notified that urine culture negative and swab from [facility] showed BV. New medication of Moxifloxin sent to pharmacy by [facility] and pt aware.

## 2016-03-25 ENCOUNTER — Encounter: Payer: Self-pay | Admitting: Obstetrics and Gynecology

## 2016-04-19 ENCOUNTER — Ambulatory Visit: Payer: Self-pay | Admitting: Physician Assistant

## 2016-04-19 ENCOUNTER — Encounter: Payer: Self-pay | Admitting: Physician Assistant

## 2016-04-19 VITALS — BP 110/70 | HR 71 | Temp 98.4°F

## 2016-04-19 DIAGNOSIS — J028 Acute pharyngitis due to other specified organisms: Secondary | ICD-10-CM

## 2016-04-19 LAB — POCT RAPID STREP A (OFFICE): Rapid Strep A Screen: NEGATIVE

## 2016-04-19 NOTE — Progress Notes (Signed)
S: C/o  Sore throat and white patch above her tonsil for 2 days, no fever, chills, cp/sob, v/d;   Using otc meds:   O: PE: vitals wnl, nad, perrl eomi, normocephalic, tms dull, nasal mucosa normal, throat injected, tonsils are pitted, ? Stone in tonsil, neck supple no lymph, lungs c t a, cv rrr, neuro intact, q strep neg  A: sore throat secondary to tonsillith   P: drink fluids, continue regular meds , use otc meds of choice, return if not improving in 5 days, return earlier if worsening , gargle with warm salt water

## 2016-04-22 ENCOUNTER — Ambulatory Visit: Payer: Self-pay | Admitting: Physician Assistant

## 2016-04-22 VITALS — BP 110/60 | HR 70 | Temp 98.7°F

## 2016-04-22 DIAGNOSIS — I890 Lymphedema, not elsewhere classified: Secondary | ICD-10-CM

## 2016-04-22 DIAGNOSIS — J358 Other chronic diseases of tonsils and adenoids: Secondary | ICD-10-CM

## 2016-04-22 NOTE — Progress Notes (Signed)
   Subjective:    Patient ID: Cynthia Harrell, female    DOB: 30-Apr-1986, 30 y.o.   MRN: [id number]  HPI Patient follow up for sore throat for 4 days.Patient seen 2 days ago at this clinic and dx with tonsillith. Patient states increasing cervical swelling, sore throat, and dysphagia. Denies fever/chill, or other URI compliant.    Review of Systems Negative except for compliant.    Objective:   Physical Exam VVS, NAD, bilateral edematous and pitted tonsils. Cervical edema.       Assessment & Plan:tonsillitis/Tonsillith  Schedule soft tissue neck ultra Sound. Start viscous lidocaine, Bromfed DM, and medrol dose pack as directed.  Follow up after ultra sound.

## 2016-04-26 ENCOUNTER — Ambulatory Visit
Admission: RE | Admit: 2016-04-26 | Discharge: 2016-04-26 | Disposition: A | Discharge status: Home / Self Care | Payer: Managed Care, Other (non HMO) | Source: Ambulatory Visit | Attending: Physician Assistant | Admitting: Physician Assistant

## 2016-04-26 DIAGNOSIS — J358 Other chronic diseases of tonsils and adenoids: Secondary | ICD-10-CM | POA: Insufficient documentation

## 2016-04-26 DIAGNOSIS — I890 Lymphedema, not elsewhere classified: Secondary | ICD-10-CM | POA: Insufficient documentation

## 2016-04-29 ENCOUNTER — Telehealth: Payer: Self-pay | Admitting: Emergency Medicine

## 2016-04-29 NOTE — Telephone Encounter (Signed)
I called the patient to inform her of her appointment with Dr. Richardson Landry at [facility].  She will be seen on 05/13/16 at 2:30pm.  Patient has accepted the appointment.

## 2016-05-20 ENCOUNTER — Encounter: Payer: Self-pay | Admitting: Obstetrics and Gynecology

## 2016-05-20 ENCOUNTER — Ambulatory Visit (INDEPENDENT_AMBULATORY_CARE_PROVIDER_SITE_OTHER): Payer: Managed Care, Other (non HMO) | Admitting: Obstetrics and Gynecology

## 2016-05-20 VITALS — BP 117/84 | HR 79 | Ht 66.0 in | Wt 149.3 lb

## 2016-05-20 DIAGNOSIS — N762 Acute vulvitis: Secondary | ICD-10-CM

## 2016-05-20 NOTE — Progress Notes (Signed)
CHIEF COMPLAINT/HPI:  30 y.o. female complains of vulvar erythema and on menses for 1 week(s). Denies abnormal vaginal bleeding, significant pelvic pain or fever. No UTI symptoms. Sexually active, does not use condoms, no change in partner.  Last unprotected intercourse __ days ago.  Denies history of known exposure to STD or symptoms in partner.  Patient's last menstrual period was 05/14/2016.  No history of STD's.  Review of Systems  Constitutional: Negative for fever and chills Eyes: Negative for visual disturbances Respiratory: Negative for shortness of breath, dyspnea Cardiovascular: Negative for chest pain or palpitations  Gastrointestinal: Negative for vomiting, diarrhea and constipation Genitourinary: Negative for dysuria and urgency Musculoskeletal: Negative for back pain, joint pain, myalgias  Neurological: Negative for dizziness and headaches    Past Medical History: Past Medical History:  Diagnosis Date  . 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 High-risk sexual behavior   . Microalbuminuria     Past Surgical History: Past Surgical History:  Procedure Laterality Date  . THERAPEUTIC ABORTION  2008    Obstetrical History: OB History    Gravida Para Term Preterm AB Living   1       1     SAB TAB Ectopic Multiple Live Births     1            Gynecological History: Pertinent Gynecological History: Menses: flow is moderate and usually lasting 4 to 5 days Bleeding: no change Contraception: abstinence DES exposure: denies Blood transfusions: none Sexually transmitted diseases: past history: trich, CMZ, HSV2+ Previous GYN Procedures: abnormal pap-colpo    Social History:Social History   Social History  . Marital status: Single    Spouse name: N/A  . Number of children: N/A  . Years of education: N/A   Social History Main Topics  . Smoking status: Never Smoker  . Smokeless tobacco: Never Used  . Alcohol use No  . Drug use: No  . Sexual activity: Yes   Birth control/ protection: Condom   Other Topics Concern  . None   Social History Narrative  . None    Family History: Family History  Problem Relation Age of Onset  . Diabetes Father   . Hypertension Mother   . Cancer Mother     melanoma  . Stroke Maternal Grandfather   . Stroke Paternal Grandfather   . COPD Neg Hx   . Heart disease Neg Hx     Allergies: No Known Allergies      PHYSICAL EXAM: Pelvic - normal external genitalia, vulva, vagina, cervix, uterus and adnexa, WET MOUNT done - results: negative for pathogens, normal epithelial cells,    Labs: No results found for this or any previous visit (from the past 24 hour(s)).   Assessment: Patient Active Problem List   Diagnosis Date Noted  . Preventative health care 08/29/2015  . Cervical cancer screening 08/29/2015  . Screening for HIV (human immunodeficiency virus) 08/24/2015  . Vaginal discharge 07/08/2015  . Hx of abnormal cervical Pap smear 07/08/2015  . Generalized anxiety disorder 06/15/2015  . 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 Microalbuminuria   . High-risk sexual behavior   mild vulvitis-contact in nature  Plan:  No orders of the defined types were placed in this encounter.  Reassured patient of normal findings on exam today. RTC 3 months for AE or prn.  Cyndee Giammarco [place], CNM

## 2016-06-22 ENCOUNTER — Encounter: Payer: Self-pay | Admitting: Obstetrics and Gynecology

## 2016-06-22 ENCOUNTER — Ambulatory Visit (INDEPENDENT_AMBULATORY_CARE_PROVIDER_SITE_OTHER): Payer: Managed Care, Other (non HMO) | Admitting: Obstetrics and Gynecology

## 2016-06-22 DIAGNOSIS — N763 Subacute and chronic vulvitis: Secondary | ICD-10-CM

## 2016-06-22 MED ORDER — FLUCONAZOLE 150 MG PO TABS: 150.0000 mg | ORAL_TABLET | Freq: Once | ORAL | 3 refills | Status: AC

## 2016-06-22 NOTE — Patient Instructions (Signed)
Thank you for enrolling in [facility]. Please follow the instructions below to securely access your online medical record. MyChart allows you to send messages to your doctor, view your test results, renew your prescriptions, schedule appointments, and more.  How Do I Sign Up? 1. In your Internet browser, go to [email or web address]. 2. Click on the New  User? link in the Sign In box.  3. Enter your MyChart Access Code exactly as it appears below. You will not need to use this code after you have completed the sign-up process. If you do not sign up before the expiration date, you must request a new code. MyChart Access Code: [email or web address] Expires: 07/19/2016  9:25 AM  4. Enter the last four digits of your Social Security Number (xxxx) and Date of Birth (mm/dd/yyyy) as indicated and click Next. You will be taken to the next sign-up page. 5. Create a MyChart ID. This will be your MyChart login ID and cannot be changed, so think of one that is secure and easy to remember. 6. Create a MyChart password. You can change your password at any time. 7. Enter your Password Reset Question and Answer and click Next. This can be used at a later time if you forget your password.  8. Select your communication preference, and if applicable enter your e-mail address. You will receive e-mail notification when new information is available in MyChart by choosing to receive e-mail notifications and filling in your e-mail. 9. Click Sign In. You can now view your medical record.   Additional Information If you have questions, you can email [email or web address] or call [phone] to talk to our [place] staff. Remember, MyChart is NOT to be used for urgent needs. For medical emergencies, dial 911.

## 2016-06-22 NOTE — Progress Notes (Signed)
CHIEF COMPLAINT/HPI:  30 y.o. female complains of green, malodorous, thick and vulvar erythema noted vaginal discharge for 1  week(s). Denies abnormal vaginal bleeding, significant pelvic pain or fever. No UTI symptoms. Sexually active, does not use condoms, no change in partner.  Last unprotected intercourse __ days ago.  Denies history of known exposure to STD or symptoms in partner.  Patient's last menstrual period was 06/09/2016.  No history of STD's.  Review of Systems  Constitutional: Negative for fever and chills Eyes: Negative for visual disturbances Respiratory: Negative for shortness of breath, dyspnea Cardiovascular: Negative for chest pain or palpitations  Gastrointestinal: Negative for vomiting, diarrhea and constipation Genitourinary: Negative for dysuria and urgency Musculoskeletal: Negative for back pain, joint pain, myalgias  Neurological: Negative for dizziness and headaches    Past Medical History: Past Medical History:  Diagnosis Date  . 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 High-risk sexual behavior   . Microalbuminuria     Past Surgical History: Past Surgical History:  Procedure Laterality Date  . THERAPEUTIC ABORTION  2008    Obstetrical History: OB History    Gravida Para Term Preterm AB Living   1       1     SAB TAB Ectopic Multiple Live Births     1            Gynecological History: Pertinent Gynecological History: Menses: normal monthly Bleeding: none Contraception: condoms DES exposure: denies Blood transfusions: none Sexually transmitted diseases: past history: HSV, CMZ Previous GYN Procedures: colpo  Last pap: normal Date: 12/16 UA +leuk only    Social History:Social History   Social History  . Marital status: Single    Spouse name: N/A  . Number of children: N/A  . Years of education: N/A   Social History Main Topics  . Smoking status: Never Smoker  . Smokeless tobacco: Never Used  . Alcohol use No  . Drug use: No  .  Sexual activity: Yes    Birth control/ protection: Condom   Other Topics Concern  . None   Social History Narrative  . None    Family History: Family History  Problem Relation Age of Onset  . Diabetes Father   . Hypertension Mother   . Cancer Mother     melanoma  . Stroke Maternal Grandfather   . Stroke Paternal Grandfather   . COPD Neg Hx   . Heart disease Neg Hx     Allergies: No Known Allergies      PHYSICAL EXAM: Pelvic - VULVA: vulvar erythema bilaterally, VAGINA: normal appearing vagina with normal color and discharge, no lesions, CERVIX: cervical discharge present - white and thin, multiparous os, WET MOUNT done - results: negative for pathogens, normal epithelial cells, lactobacilli   Labs: No results found for this or any previous visit (from the past 24 hour(s)).   Assessment: Patient Active Problem List   Diagnosis Date Noted  . Preventative health care 08/29/2015  . Cervical cancer screening 08/29/2015  . Screening for HIV (human immunodeficiency virus) 08/24/2015  . Vaginal discharge 07/08/2015  . Hx of abnormal cervical Pap smear 07/08/2015  . Generalized anxiety disorder 06/15/2015  . 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 Microalbuminuria   . High-risk sexual behavior     Plan:  No orders of the defined types were placed in this encounter.  GC/CMZ sent per patient request Urine sent for culture Diflucan 150mg  now and repeat in 1 week if needed. RTC as needed.  Melody N  [employer]

## 2016-06-24 ENCOUNTER — Other Ambulatory Visit: Payer: Self-pay | Admitting: Obstetrics and Gynecology

## 2016-06-24 LAB — CULTURE, URINE COMPREHENSIVE

## 2016-06-24 MED ORDER — FLUCONAZOLE 100 MG PO TABS: 100.0000 mg | ORAL_TABLET | Freq: Every day | ORAL | 1 refills | Status: DC

## 2016-06-27 ENCOUNTER — Telehealth: Payer: Self-pay | Admitting: *Deleted

## 2016-06-27 NOTE — Telephone Encounter (Signed)
Notified pt of results 

## 2016-06-27 NOTE — Telephone Encounter (Signed)
-----   Message from Joylene Igo, [place] sent at 06/24/2016  4:43 PM EDT ----- Please let her know urine also grew a lot of yeast, so she needs to stay on Diflucan longer. I sent in a RX.

## 2016-07-22 ENCOUNTER — Telehealth: Payer: Self-pay | Admitting: Obstetrics and Gynecology

## 2016-07-22 NOTE — Telephone Encounter (Signed)
Pt called and she said that she was here a few weeks ago and she was treated for yeast infection and she called today and said that it hasn't really help, she took a diflucan on Monday and still can tell a difference, the next available appt that I scheduled her in was on 11/21 and she said she doesnt think she can wai that long and wanted to see if there was something else that could be called in.

## 2016-07-22 NOTE — Telephone Encounter (Signed)
pls advise

## 2016-08-09 ENCOUNTER — Encounter: Payer: Self-pay | Admitting: Obstetrics and Gynecology

## 2016-08-09 ENCOUNTER — Ambulatory Visit (INDEPENDENT_AMBULATORY_CARE_PROVIDER_SITE_OTHER): Payer: Managed Care, Other (non HMO) | Admitting: Obstetrics and Gynecology

## 2016-08-09 VITALS — BP 122/84 | HR 78 | Ht 66.0 in | Wt 148.1 lb

## 2016-08-09 DIAGNOSIS — N898 Other specified noninflammatory disorders of vagina: Secondary | ICD-10-CM | POA: Diagnosis not present

## 2016-08-10 NOTE — Progress Notes (Signed)
Subjective:     Patient ID: Cynthia Harrell, female   DOB: Jul 14, 1986, 30 y.o.   MRN: [id number]  HPI Desires quick check to make sure yeast infection is gone. Had menses last week and denies itching at this time.  Review of Systems negative    Objective:   Physical Exam A&O x4 Well groomed female Microscopic wet-mount exam shows negative for pathogens, normal epithelial cells.    Assessment:     Benign leukorrhea    Plan:     Reassured. No medications needed at this time.  Tilton Marsalis [place], CNM

## 2016-08-30 ENCOUNTER — Other Ambulatory Visit: Payer: Self-pay | Admitting: Obstetrics and Gynecology

## 2016-08-30 ENCOUNTER — Encounter: Payer: Self-pay | Admitting: Obstetrics and Gynecology

## 2016-08-30 ENCOUNTER — Ambulatory Visit (INDEPENDENT_AMBULATORY_CARE_PROVIDER_SITE_OTHER): Payer: Managed Care, Other (non HMO) | Admitting: Obstetrics and Gynecology

## 2016-08-30 VITALS — BP 118/78 | HR 98 | Ht 66.0 in | Wt 154.2 lb

## 2016-08-30 DIAGNOSIS — Z30011 Encounter for initial prescription of contraceptive pills: Secondary | ICD-10-CM | POA: Diagnosis not present

## 2016-08-30 DIAGNOSIS — Z01419 Encounter for gynecological examination (general) (routine) without abnormal findings: Secondary | ICD-10-CM

## 2016-08-30 MED ORDER — NORGESTIM-ETH ESTRAD TRIPHASIC 0.18/0.215/0.25 MG-35 MCG PO TABS: 1.0000 | ORAL_TABLET | Freq: Every day | ORAL | 11 refills | Status: DC

## 2016-08-30 NOTE — Progress Notes (Signed)
  Subjective:     Cynthia Harrell is a 30 y.o. female and is here for a comprehensive physical exam. The patient reports that she is concerned about STD's. Partner has been tested but unsure for what. States a burning sensation in her vaginal area that has occurred at random times during the day since she last had intercourse approximately 2 weeks ago, unsure if she may have a tear. Has had period since. Denies vaginal discharge or urinary symptoms.  Currently works at [employer] Declines Flu Vaccine TDAP Up to date  LMP: 12/7-usually lasts 5-6 days, starts off spotting and then heavy bleeding for 3 days and then light for the last day or two. This past period was lighter than normal but used the morning after pill after previous intercourse. Is currently not on birth control but would like to discuss OCP options.  Social History   Social History  . Marital status: Single    Spouse name: N/A  . Number of children: N/A  . Years of education: N/A   Occupational History  . Not on file.   Social History Main Topics  . Smoking status: Never Smoker  . Smokeless tobacco: Never Used  . Alcohol use No  . Drug use: No  . Sexual activity: Yes    Birth control/ protection: Condom   Other Topics Concern  . Not on file   Social History Narrative  . No narrative on file   Health Maintenance  Topic Date Due  . INFLUENZA VACCINE  04/19/2016  . PAP SMEAR  08/24/2016  . TETANUS/TDAP  06/03/2018  . HIV Screening  Completed    The following portions of the patient's history were reviewed and updated as appropriate: allergies, current medications, past family history, past medical history, past social history, past surgical history and problem list.  Review of Systems Pertinent items noted in HPI and remainder of comprehensive ROS otherwise negative.   Objective:    BP 118/78   Pulse 98   Ht 5\' 6"  (1.676 m)   Wt 154 lb 3.2 oz (69.9 kg)   LMP 08/25/2016   BMI 24.89 kg/m  General  appearance: alert, cooperative and appears stated age Head: Normocephalic, without obvious abnormality, atraumatic Neck: no adenopathy, no carotid bruit, no JVD, supple, symmetrical, trachea midline and thyroid not enlarged, symmetric, no tenderness/mass/nodules Back: symmetric, no curvature. ROM normal. No CVA tenderness. Lungs: clear to auscultation bilaterally Breasts: normal appearance, no masses or tenderness Heart: regular rate and rhythm, S1, S2 normal, no murmur, click, rub or gallop Abdomen: soft, non-tender; bowel sounds normal; no masses,  no organomegaly Pelvic: cervix normal in appearance, external genitalia normal, no adnexal masses or tenderness, no cervical motion tenderness, rectovaginal septum normal, uterus normal size, shape, and consistency and small amount of blood tinged discharge noted Extremities: extremities normal, atraumatic, no cyanosis or edema Pulses: 2+ and symmetric Skin: Skin color, texture, turgor normal. No rashes or lesions Lymph nodes: Cervical, supraclavicular, and axillary nodes normal. Neurologic: Grossly normal    Assessment:    Healthy female exam.  Contraceptive counseling Need for flu     Plan:    OCP initiated. Discussed the need for back-up method for the first month of use. Flu vaccine declined Follow up in 1 year for AE or sooner if needed.  Thea Gist, RN, Student FNP  See After Visit Summary for Counseling Recommendations

## 2016-08-30 NOTE — Patient Instructions (Signed)
  Place annual gynecologic exam patient instructions here.  Thank you for enrolling in [facility] [place]. Please follow the instructions below to securely access your online medical record. MyChart allows you to send messages to your doctor, view your test results, manage appointments, and more.   How Do I Sign Up? 1. In your Internet browser, go to [employer] and enter [email or web address]. 2. Click on the Sign Up Now link in the Sign In box. You will see the New Member Sign Up page. 3. Enter your MyChart Access Code exactly as it appears below. You will not need to use this code after you've completed the sign-up process. If you do not sign up before the expiration date, you must request a new code.  MyChart Access Code: [id number] Expires: 10/29/2016  2:30 PM  4. Enter your Social Security Number ([id number]) and Date of Birth (mm/dd/yyyy) as indicated and click Submit. You will be taken to the next sign-up page. 5. Create a MyChart ID. This will be your MyChart login ID and cannot be changed, so think of one that is secure and easy to remember. 6. Create a MyChart password. You can change your password at any time. 7. Enter your Password Reset Question and Answer. This can be used at a later time if you forget your password.  8. Enter your e-mail address. You will receive e-mail notification when new information is available in [facility]. 9. Click Sign Up. You can now view your medical record.   Additional Information Remember, MyChart is NOT to be used for urgent needs. For medical emergencies, dial 911.

## 2016-08-31 LAB — CYTOLOGY - PAP

## 2016-09-05 ENCOUNTER — Encounter: Payer: Self-pay | Admitting: Physician Assistant

## 2016-09-05 ENCOUNTER — Ambulatory Visit: Payer: Self-pay | Admitting: Physician Assistant

## 2016-09-05 VITALS — BP 120/60 | HR 83 | Temp 98.4°F

## 2016-09-05 DIAGNOSIS — J039 Acute tonsillitis, unspecified: Secondary | ICD-10-CM

## 2016-09-05 LAB — POCT RAPID STREP A (OFFICE): RAPID STREP A SCREEN: NEGATIVE

## 2016-09-05 MED ORDER — AZITHROMYCIN 250 MG PO TABS: ORAL_TABLET | ORAL | 0 refills | Status: DC

## 2016-09-05 MED ORDER — FLUCONAZOLE 100 MG PO TABS: 100.0000 mg | ORAL_TABLET | Freq: Once | ORAL | 0 refills | Status: AC

## 2016-09-05 MED ORDER — METHYLPREDNISOLONE 4 MG PO TBPK: ORAL_TABLET | ORAL | 0 refills | Status: DC

## 2016-09-05 NOTE — Progress Notes (Signed)
S: c/o sore throat, worse in the mornings and at night, still hurts to swallow throughout the day, no fever/chills, no cp/sob, mild runny nose is a mix of clear/yellow when she does blow her nose, using otc meds  O: vitals wnl, nad, tms clear, nasal mucosa pink/swollen, throat w swollen pitted tonsils, some exudate, neck supple no lymph, lungs c t a, cv rrr, q strep neg  A: acute tonsillitis  P: zpack, medrol dose pack, diflucan

## 2016-09-06 ENCOUNTER — Telehealth: Payer: Self-pay | Admitting: Obstetrics and Gynecology

## 2016-09-06 NOTE — Telephone Encounter (Signed)
pls advise

## 2016-09-06 NOTE — Telephone Encounter (Signed)
Pt called about pap results

## 2016-09-07 NOTE — Telephone Encounter (Signed)
Please let her know results and remind her to sign up for mychart as that is how we notify about labs now,

## 2016-09-16 ENCOUNTER — Ambulatory Visit: Payer: Self-pay | Admitting: Physician Assistant

## 2016-09-16 ENCOUNTER — Encounter: Payer: Self-pay | Admitting: Physician Assistant

## 2016-09-16 VITALS — BP 119/81 | HR 82 | Temp 98.4°F

## 2016-09-16 DIAGNOSIS — N390 Urinary tract infection, site not specified: Secondary | ICD-10-CM

## 2016-09-16 DIAGNOSIS — R319 Hematuria, unspecified: Principal | ICD-10-CM

## 2016-09-16 LAB — POCT URINALYSIS DIPSTICK
BILIRUBIN UA: NEGATIVE
Glucose, UA: NEGATIVE
Ketones, UA: NEGATIVE
NITRITE UA: NEGATIVE
PH UA: 7
Spec Grav, UA: 1.025
Urobilinogen, UA: 0.2

## 2016-09-16 MED ORDER — PHENAZOPYRIDINE HCL 200 MG PO TABS: 200.0000 mg | ORAL_TABLET | Freq: Three times a day (TID) | ORAL | 0 refills | Status: DC | PRN

## 2016-09-16 MED ORDER — SULFAMETHOXAZOLE-TRIMETHOPRIM 800-160 MG PO TABS: 1.0000 | ORAL_TABLET | Freq: Two times a day (BID) | ORAL | 0 refills | Status: DC

## 2016-09-16 NOTE — Progress Notes (Signed)
   Subjective:UTI    Patient ID: Cynthia Harrell, female    DOB: 1986-06-27, 30 y.o.   MRN: [id number]  HPI Patient c/o urinary urgency and frequency for one day. Denies dysuria. Denies Vaginal discharge, flank pain, or fever. No palliative measures for compliant.   Review of Systems Negative except for compliant.    Objective:   Physical Exam Deferred. Dip UA reveal 1+ leuc and Blood.       Assessment & Plan:UTI  Bactrim DS and Pyridium. Follow up one week with PCP if no improvement.

## 2016-11-09 ENCOUNTER — Ambulatory Visit (INDEPENDENT_AMBULATORY_CARE_PROVIDER_SITE_OTHER): Payer: Managed Care, Other (non HMO) | Admitting: Obstetrics and Gynecology

## 2016-11-09 ENCOUNTER — Encounter: Payer: Self-pay | Admitting: Obstetrics and Gynecology

## 2016-11-09 VITALS — BP 121/79 | HR 76 | Ht 66.0 in | Wt 152.2 lb

## 2016-11-09 DIAGNOSIS — R300 Dysuria: Secondary | ICD-10-CM | POA: Diagnosis not present

## 2016-11-09 LAB — POCT URINALYSIS DIPSTICK
BILIRUBIN UA: NEGATIVE
GLUCOSE UA: NEGATIVE
Ketones, UA: NEGATIVE
Leukocytes, UA: NEGATIVE
NITRITE UA: NEGATIVE
Protein, UA: NEGATIVE
RBC UA: NEGATIVE
SPEC GRAV UA: 1.015
UROBILINOGEN UA: 0.2
pH, UA: 6.5

## 2016-11-09 NOTE — Progress Notes (Signed)
Subjective:     Patient ID: Cynthia Harrell, female   DOB: 1985/11/16, 31 y.o.   MRN: [id number]  HPI Had a UTI few weeks ago, then noticed increase in vaginal discharge and irritation for 2 weeks. Took yeast pill three weeks ago. Denies any HSV outbreaks. Also reports frequent urination, but admits to increased soda intake for the last few weeks.  Review of Systems Negative except stated above in HPI    Objective:   Physical Exam A&O x4 Well groomed female in no distress Blood pressure 121/79, pulse 76, height 5\' 6"  (1.676 m), weight 152 lb 3.2 oz (69 kg), last menstrual period 10/13/2016. Pelvic exam: normal external genitalia, vulva, vagina, cervix, uterus and adnexa, WET MOUNT done - results: negative for pathogens, normal epithelial cells. Urinalysis    Component Value Date/Time   COLORURINE Yellow 03/24/2013 2159   APPEARANCEUR Clear 03/24/2013 2159   LABSPEC 1.024 03/24/2013 2159   PHURINE 6.0 03/24/2013 2159   GLUCOSEU Negative 03/24/2013 2159   HGBUR Negative 03/24/2013 2159   BILIRUBINUR neg 11/09/2016 1007   BILIRUBINUR Negative 03/24/2013 2159   KETONESUR Negative 03/24/2013 2159   PROTEINUR neg 11/09/2016 1007   PROTEINUR Negative 03/24/2013 2159   UROBILINOGEN 0.2 11/09/2016 1007   NITRITE neg 11/09/2016 1007   NITRITE Negative 03/24/2013 2159   LEUKOCYTESUR Negative 11/09/2016 1007   LEUKOCYTESUR 2+ 03/24/2013 2159       Assessment:     Vaginal discharge Urinary frequency    Plan:     reassured of normal findings, encouraged cutting out sodas and increasing water intake. RTC as needed.  Karalynn [place]one [place], CNM

## 2016-11-30 ENCOUNTER — Ambulatory Visit: Payer: Self-pay | Admitting: Physician Assistant

## 2016-11-30 VITALS — BP 124/75 | HR 76 | Temp 98.7°F | Ht 67.0 in | Wt 150.0 lb

## 2016-11-30 DIAGNOSIS — Z008 Encounter for other general examination: Secondary | ICD-10-CM

## 2016-11-30 DIAGNOSIS — Z0189 Encounter for other specified special examinations: Principal | ICD-10-CM

## 2016-11-30 NOTE — Progress Notes (Signed)
Patient came in to have her blood drawn for her biometric screening.  Patient expressed that she has already had her physical with her personal physician.

## 2016-12-01 LAB — CMP12+LP+TP+TSH+6AC+CBC/D/PLT
ALT: 12 IU/L (ref 0–32)
AST: 14 IU/L (ref 0–40)
Albumin/Globulin Ratio: 1.7 (ref 1.2–2.2)
Albumin: 4.4 g/dL (ref 3.5–5.5)
Alkaline Phosphatase: 49 IU/L (ref 39–117)
BASOS ABS: 0 10*3/uL (ref 0.0–0.2)
BASOS: 0 %
BUN / CREAT RATIO: 13 (ref 9–23)
BUN: 10 mg/dL (ref 6–20)
Bilirubin Total: 0.5 mg/dL (ref 0.0–1.2)
CALCIUM: 9 mg/dL (ref 8.7–10.2)
CHLORIDE: 103 mmol/L (ref 96–106)
CREATININE: 0.8 mg/dL (ref 0.57–1.00)
Chol/HDL Ratio: 3 ratio units (ref 0.0–4.4)
Cholesterol, Total: 145 mg/dL (ref 100–199)
EOS (ABSOLUTE): 0 10*3/uL (ref 0.0–0.4)
EOS: 1 %
Free Thyroxine Index: 2.4 (ref 1.2–4.9)
GFR calc Af Amer: 116 mL/min/{1.73_m2} (ref >59)
GFR, EST NON AFRICAN AMERICAN: 101 mL/min/{1.73_m2} (ref >59)
GGT: 10 IU/L (ref 0–60)
GLUCOSE: 89 mg/dL (ref 65–99)
Globulin, Total: 2.6 g/dL (ref 1.5–4.5)
HDL: 48 mg/dL (ref >39)
HEMOGLOBIN: 13.4 g/dL (ref 11.1–15.9)
Hematocrit: 40.9 % (ref 34.0–46.6)
IRON: 102 ug/dL (ref 27–159)
Immature Grans (Abs): 0 10*3/uL (ref 0.0–0.1)
Immature Granulocytes: 0 %
LDH: 160 IU/L (ref 119–226)
LDL Calculated: 77 mg/dL (ref 0–99)
LYMPHS ABS: 1.5 10*3/uL (ref 0.7–3.1)
Lymphs: 24 %
MCH: 31.6 pg (ref 26.6–33.0)
MCHC: 32.8 g/dL (ref 31.5–35.7)
MCV: 97 fL (ref 79–97)
Monocytes Absolute: 0.5 10*3/uL (ref 0.1–0.9)
Monocytes: 8 %
NEUTROS ABS: 4.3 10*3/uL (ref 1.4–7.0)
Neutrophils: 67 %
PHOSPHORUS: 2.8 mg/dL (ref 2.5–4.5)
POTASSIUM: 4.1 mmol/L (ref 3.5–5.2)
Platelets: 217 10*3/uL (ref 150–379)
RBC: 4.24 x10E6/uL (ref 3.77–5.28)
RDW: 12.4 % (ref 12.3–15.4)
SODIUM: 140 mmol/L (ref 134–144)
T3 UPTAKE RATIO: 28 % (ref 24–39)
T4, Total: 8.5 ug/dL (ref 4.5–12.0)
TOTAL PROTEIN: 7 g/dL (ref 6.0–8.5)
TSH: 1.32 u[IU]/mL (ref 0.450–4.500)
Triglycerides: 102 mg/dL (ref 0–149)
URIC ACID: 3.2 mg/dL (ref 2.5–7.1)
VLDL CHOLESTEROL CAL: 20 mg/dL (ref 5–40)
WBC: 6.4 10*3/uL (ref 3.4–10.8)

## 2016-12-01 LAB — VITAMIN D 25 HYDROXY (VIT D DEFICIENCY, FRACTURES): Vit D, 25-Hydroxy: 22.1 ng/mL — ABNORMAL LOW (ref 30.0–100.0)

## 2016-12-13 ENCOUNTER — Ambulatory Visit: Payer: Self-pay | Admitting: Physician Assistant

## 2016-12-13 ENCOUNTER — Encounter: Payer: Self-pay | Admitting: Physician Assistant

## 2016-12-13 VITALS — BP 120/77 | HR 74 | Temp 98.5°F

## 2016-12-13 DIAGNOSIS — H1013 Acute atopic conjunctivitis, bilateral: Secondary | ICD-10-CM

## 2016-12-13 MED ORDER — OLOPATADINE HCL 0.1 % OP SOLN: 1.0000 [drp] | Freq: Two times a day (BID) | OPHTHALMIC | 12 refills | Status: DC

## 2016-12-13 NOTE — Progress Notes (Signed)
S: states her eyes have been itchy and watering, started a couple of weeks ago, had a small amount of crusting the other day, none since but eyes are still watering a lot  O: vitals wnl, nad, perrl eomi, conjunctiva wnl, neck supple no lymph, lungs c t a, cv rrr  A: allergic conjunctivitis  P: patanol opth gtts

## 2016-12-16 ENCOUNTER — Other Ambulatory Visit: Payer: Self-pay | Admitting: Obstetrics and Gynecology

## 2016-12-16 DIAGNOSIS — N390 Urinary tract infection, site not specified: Secondary | ICD-10-CM

## 2016-12-16 DIAGNOSIS — R319 Hematuria, unspecified: Principal | ICD-10-CM

## 2016-12-21 ENCOUNTER — Encounter: Payer: Self-pay | Admitting: Certified Nurse Midwife

## 2016-12-21 ENCOUNTER — Ambulatory Visit (INDEPENDENT_AMBULATORY_CARE_PROVIDER_SITE_OTHER): Payer: Managed Care, Other (non HMO) | Admitting: Certified Nurse Midwife

## 2016-12-21 VITALS — BP 131/80 | HR 88 | Ht 66.0 in | Wt 151.0 lb

## 2016-12-21 DIAGNOSIS — R300 Dysuria: Secondary | ICD-10-CM

## 2016-12-21 LAB — POCT URINALYSIS DIPSTICK
BILIRUBIN UA: NEGATIVE
Blood, UA: NEGATIVE
Glucose, UA: NEGATIVE
KETONES UA: NEGATIVE
Leukocytes, UA: NEGATIVE
Nitrite, UA: NEGATIVE
PH UA: 6.5 (ref 5.0–8.0)
Protein, UA: NEGATIVE
SPEC GRAV UA: 1.01 (ref 1.030–1.035)
Urobilinogen, UA: 0.2 (ref ?–2.0)

## 2016-12-21 NOTE — Patient Instructions (Signed)
Acute Urinary Retention, Female Urinary retention means you are unable to pee completely or at all (empty your bladder). Follow these instructions at home:  Drink enough fluids to keep your pee (urine) clear or pale yellow.  If you are sent home with a tube that drains the bladder (catheter), there will be a drainage bag attached to it. There are two types of bags. One is big that you can wear at night without having to empty it. One is smaller and needs to be emptied more often.  Keep the drainage bag emptied.  Keep the drainage bag lower than the tube.  Only take medicine as told by your doctor. Contact a doctor if:  You have a low-grade fever.  You have spasms or you are leaking pee when you have spasms. Get help right away if:  You have chills or a fever.  Your catheter stops draining pee.  Your catheter falls out.  You have increased bleeding that does not stop after you have rested and increased the amount of fluids you had been drinking. This information is not intended to replace advice given to you by your health care provider. Make sure you discuss any questions you have with your health care provider. Document Released: 02/22/2008 Document Revised: 02/11/2016 Document Reviewed: 02/14/2013 Elsevier Interactive Patient Education  2017 Elsevier Inc.  

## 2016-12-21 NOTE — Progress Notes (Signed)
Subjective:    SNOW PEOPLES is a 31 y.o. female who complains of dysuria and urgency for 1 week.  Patient also complains of vaginal discharge. Patient denies back pain and fever.  Patient does have a history of  UTI's.  Patient does not have a history of pyelonephritis. Denies new sexual partners.  The following portions of the patient's history were reviewed and updated as appropriate: allergies, current medications, past family history, past medical history, past social history, past surgical history and problem list. Review of Systems Pertinent items are noted in HPI.    Objective:    BP 131/80   Pulse 88   Ht 5\' 6"  (1.676 m)   Wt 151 lb (68.5 kg)   LMP 12/07/2016   BMI 24.37 kg/m  General: alert, cooperative and appears stated age  Abdomen: soft and nondistended    Back: Negative CVA  GU No redness, odor, or lesions present . Small amount of white discharge   Laboratory:  Urine dipstick shows negative for all components urine sent for culture.   Micro exam: not done.    Wet Prep: shows small yeast, negative clue cells Assessment:    yeast     Plan: Plan:    1. Medications: over the counter monastat , will notify of urine culture results 2. Maintain adequate hydration, reduce soda, caffeine intake  3. Follow up if symptoms not improving, and prn.    Philip Aspen, CNM

## 2016-12-23 LAB — URINE CULTURE

## 2016-12-24 ENCOUNTER — Telehealth: Payer: Self-pay | Admitting: Certified Nurse Midwife

## 2016-12-24 NOTE — Telephone Encounter (Signed)
Cynthia Harrell informed that her urine culture was negative  Philip Aspen, CNM

## 2016-12-27 ENCOUNTER — Telehealth: Payer: Self-pay | Admitting: Obstetrics and Gynecology

## 2016-12-27 NOTE — Telephone Encounter (Signed)
Patient needs refill on the Diflucan she is having discharge and [place]

## 2016-12-28 ENCOUNTER — Other Ambulatory Visit: Payer: Self-pay | Admitting: *Deleted

## 2016-12-28 MED ORDER — FLUCONAZOLE 150 MG PO TABS: 150.0000 mg | ORAL_TABLET | Freq: Once | ORAL | 3 refills | Status: AC

## 2016-12-28 NOTE — Telephone Encounter (Signed)
Done-ac 

## 2017-01-06 ENCOUNTER — Encounter: Payer: Self-pay | Admitting: Physician Assistant

## 2017-01-06 ENCOUNTER — Ambulatory Visit: Payer: Self-pay | Admitting: Physician Assistant

## 2017-01-06 VITALS — BP 119/70 | HR 83 | Temp 97.8°F

## 2017-01-06 DIAGNOSIS — R509 Fever, unspecified: Secondary | ICD-10-CM

## 2017-01-06 DIAGNOSIS — J069 Acute upper respiratory infection, unspecified: Secondary | ICD-10-CM

## 2017-01-06 LAB — POCT INFLUENZA A/B
INFLUENZA B, POC: NEGATIVE
Influenza A, POC: NEGATIVE

## 2017-01-06 MED ORDER — FLUCONAZOLE 150 MG PO TABS: 150.0000 mg | ORAL_TABLET | Freq: Once | ORAL | 0 refills | Status: AC

## 2017-01-06 MED ORDER — FLUTICASONE PROPIONATE 50 MCG/ACT NA SUSP: 2.0000 | Freq: Every day | NASAL | 6 refills | Status: DC

## 2017-01-06 MED ORDER — PREDNISONE 10 MG PO TABS: 30.0000 mg | ORAL_TABLET | Freq: Every day | ORAL | 0 refills | Status: DC

## 2017-01-06 MED ORDER — AMOXICILLIN 875 MG PO TABS: 875.0000 mg | ORAL_TABLET | Freq: Two times a day (BID) | ORAL | 0 refills | Status: DC

## 2017-01-06 NOTE — Progress Notes (Signed)
S: C/o runny nose and congestion with dry cough for 3 days, ? fever, did have chills and body aches, denies cp/sob, v/d; mucus was green this am but clear throughout the day, cough is sporadic, having a lot of facial pain and congestion  Using otc meds: alka seltzer plus  O: PE: vitals wnl, nad,  perrl eomi, normocephalic, tms dull, nasal mucosa red and swollen, throat injected, neck supple no lymph, lungs c t a, cv rrr, neuro intact, flu swab neg  A:  Acute uri  P: drink fluids, continue regular meds , use otc meds of choice, return if not improving in 5 days, return earlier if worsening , amoxil, pred 30mg  qd x 3d, diflucan, flonase

## 2017-01-23 ENCOUNTER — Encounter: Payer: Self-pay | Admitting: Certified Nurse Midwife

## 2017-01-23 ENCOUNTER — Ambulatory Visit (INDEPENDENT_AMBULATORY_CARE_PROVIDER_SITE_OTHER): Payer: Managed Care, Other (non HMO) | Admitting: Certified Nurse Midwife

## 2017-01-23 VITALS — BP 118/83 | HR 80 | Ht 66.0 in | Wt 151.4 lb

## 2017-01-23 DIAGNOSIS — B379 Candidiasis, unspecified: Secondary | ICD-10-CM | POA: Diagnosis not present

## 2017-01-23 DIAGNOSIS — T3695XA Adverse effect of unspecified systemic antibiotic, initial encounter: Secondary | ICD-10-CM

## 2017-01-23 DIAGNOSIS — N760 Acute vaginitis: Secondary | ICD-10-CM | POA: Diagnosis not present

## 2017-01-23 MED ORDER — TERCONAZOLE 0.4 % VA CREA: 1.0000 | TOPICAL_CREAM | Freq: Every day | VAGINAL | 0 refills | Status: DC

## 2017-01-23 MED ORDER — NYSTATIN-TRIAMCINOLONE 100000-0.1 UNIT/GM-% EX CREA: 1.0000 "application " | TOPICAL_CREAM | Freq: Two times a day (BID) | CUTANEOUS | 0 refills | Status: DC

## 2017-01-23 NOTE — Progress Notes (Signed)
GYN ENCOUNTER NOTE  Subjective:       Cynthia Harrell is a 31 y.o. G52P0010 female here for gynecologic evaluation of vaginal itching, vaginal redness, and white, yellow malodorous vaginal discharge for the last four (4) days.   [name] was treated with Amoxicillin for a sinus infection. She was prescribed a single Diflucan 150 mg tablet to take when she completed her antibiotic course on Thursday, 01/19/17.   She experience no relief in her symptoms, so she took a single 100 mg Diflucan on Saturday and Sunday.   Today, she reports that her symptoms are getting "a little better", but she is still experiencing external discomfort.   Denies difficulty breathing or respiratory distress, chest pain, abdominal pain, unexplained vaginal bleeding, and leg pain or swelling.    Gynecologic History  Patient's last menstrual period was 01/02/2017 (exact date).  Contraception: OCP (estrogen/progesterone)  Last Pap: 08/30/2016. Results were: abnormal  Obstetric History OB History  Gravida Para Term Preterm AB Living  1       1    SAB TAB Ectopic Multiple Live Births    1          # Outcome Date GA Lbr Len/2nd Weight Sex Delivery Anes PTL Lv  1 TAB      TAB         Past Medical History:  Diagnosis Date  . 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 High-risk sexual behavior   . Microalbuminuria     Past Surgical History:  Procedure Laterality Date  . THERAPEUTIC ABORTION  2008    Current Outpatient Prescriptions on File Prior to Visit  Medication Sig Dispense Refill  . Norgestimate-Ethinyl Estradiol Triphasic (ORTHO TRI-CYCLEN, 28,) 0.18/0.215/0.25 MG-35 MCG tablet Take 1 tablet by mouth daily. 1 Package 11  . valACYclovir (VALTREX) 500 MG tablet TAKE 1 TABLET (500 MG TOTAL) BY MOUTH 2 (TWO) TIMES DAILY. 60 tablet 6   No current facility-administered medications on file prior to visit.     No Known Allergies  Social History   Social History  . Marital status: Single    Spouse  name: N/A  . Number of children: N/A  . Years of education: N/A   Occupational History  . Not on file.   Social History Main Topics  . Smoking status: Never Smoker  . Smokeless tobacco: Never Used  . Alcohol use No  . Drug use: No  . Sexual activity: Yes    Birth control/ protection: Pill   Other Topics Concern  . Not on file   Social History Narrative  . No narrative on file    Family History  Problem Relation Age of Onset  . Diabetes Father   . Hypertension Mother   . Cancer Mother     melanoma  . Stroke Maternal Grandfather   . Stroke Paternal Grandfather   . COPD Neg Hx   . Heart disease Neg Hx     The following portions of the patient's history were reviewed and updated as appropriate: allergies, current medications, past family history, past medical history, past social history, past surgical history and problem list.  Review of Systems  Review of Systems - Negative except as noted above.  History obtained from the patient.  Objective:   BP 118/83   Pulse 80   Ht 5\' 6"  (1.676 m)   Wt 151 lb 6.4 oz (68.7 kg)   LMP 01/02/2017 (Exact Date)   BMI 24.44 kg/m   CONSTITUTIONAL: Well-developed, well-nourished female in  no acute distress.   ABDOMEN: Soft, non distended; Non tender.  No Organomegaly.  PELVIC:  External Genitalia: Erythema present  Vagina: Normal, creamy vaginal discharge present  Cervix: Normal   Assessment:   1. Acute vaginitis - NuSwab BV and Candida, NAA  2. Antibiotic-induced yeast infection - NuSwab BV and Candida, NAA  Plan:   NuSwab collected, will contact pt with results.   Rx Terazole and Mycolog II, see orders.   Discussed vaginal health techniques.   Reviewed red flag symptoms and when to call.    Diona Fanti, CNM

## 2017-01-23 NOTE — Patient Instructions (Addendum)
Vaginitis Vaginitis is an inflammation of the vagina. It can happen when the normal bacteria and yeast in the vagina grow too much. There are different types. Treatment will depend on the type you have. Follow these instructions at home:  Take all medicines as told by your doctor.  Keep your vagina area clean and dry. Avoid soap. Rinse the area with water.  Avoid washing and cleaning out the vagina (douching).  Do not use tampons or have sex (intercourse) until your treatment is done.  Wipe from front to back after going to the restroom.  Wear cotton underwear.  Avoid wearing underwear while you sleep until your vaginitis is gone.  Avoid tight pants. Avoid underwear or nylons without a cotton panel.  Take off wet clothing (such as a bathing suit) as soon as you can.  Use mild, unscented products. Avoid fabric softeners and scented:  Feminine sprays.  Laundry detergents.  Tampons.  Soaps or bubble baths.  Practice safe sex and use condoms. Get help right away if:  You have belly (abdominal) pain.  You have a fever or lasting symptoms for more than 2-3 days.  You have a fever and your symptoms suddenly get worse. This information is not intended to replace advice given to you by your health care provider. Make sure you discuss any questions you have with your health care provider. Document Released: 12/02/2008 Document Revised: 02/11/2016 Document Reviewed: 02/16/2012 Elsevier Interactive Patient Education  2017 [employer].  How to Take a [employer] A sitz bath is a warm water bath that is taken while you are sitting down. The water should only come up to your hips and should cover your buttocks. Your health care provider may recommend a sitz bath to help you:  Clean the lower part of your body, including your genital area.  With itching.  With pain.  With sore muscles or muscles that tighten or spasm. How to take a sitz bath Take 3-4 sitz baths per day or as  told by your health care provider. 1. Partially fill a bathtub with warm water. You will only need the water to be deep enough to cover your hips and buttocks when you are sitting in it. 2. If your health care provider told you to put medicine in the water, follow the directions exactly. 3. Sit in the water and open the tub drain a little. 4. Turn on the warm water again to keep the tub at the correct level. Keep the water running constantly. 5. Soak in the water for 15-20 minutes or as told by your health care provider. 6. After the sitz bath, pat the affected area dry first. Do not rub it. 7. Be careful when you stand up after the sitz bath because you may feel dizzy. Contact a health care provider if:  Your symptoms get worse. Do not continue with sitz baths if your symptoms get worse.  You have new symptoms. Do not continue with sitz baths until you talk with your health care provider. This information is not intended to replace advice given to you by your health care provider. Make sure you discuss any questions you have with your health care provider. Document Released: 05/28/2004 Document Revised: 02/03/2016 Document Reviewed: 09/03/2014 Elsevier Interactive Patient Education  2017 [employer].

## 2017-01-27 LAB — NUSWAB BV AND CANDIDA, NAA
Candida albicans, NAA: NEGATIVE
Candida glabrata, NAA: NEGATIVE

## 2017-01-27 NOTE — Progress Notes (Signed)
Hello Cynthia Harrell, would you please contact the patient and let her know that her NuSwab came back negative. Her home treatment measures probably cleared her yeast infection. Thanks, JML

## 2017-02-17 ENCOUNTER — Telehealth: Payer: Self-pay | Admitting: Obstetrics and Gynecology

## 2017-02-17 ENCOUNTER — Other Ambulatory Visit: Payer: Self-pay | Admitting: *Deleted

## 2017-02-17 MED ORDER — FLUCONAZOLE 150 MG PO TABS: 150.0000 mg | ORAL_TABLET | Freq: Once | ORAL | 3 refills | Status: AC

## 2017-02-17 NOTE — Telephone Encounter (Signed)
Done-ac 

## 2017-02-17 NOTE — Telephone Encounter (Signed)
Patient called and requested message be sent to [facility] to ask for a diflucan to be called in for her - she has a yeast infection  [place]

## 2017-02-27 ENCOUNTER — Telehealth: Payer: Self-pay | Admitting: Obstetrics and Gynecology

## 2017-02-27 NOTE — Telephone Encounter (Signed)
Called pt we discussed her issues, has appt 03/01/17

## 2017-02-27 NOTE — Telephone Encounter (Signed)
Patient called requesting to speak with you. Thanks °

## 2017-03-01 ENCOUNTER — Encounter: Payer: Managed Care, Other (non HMO) | Admitting: Obstetrics and Gynecology

## 2017-03-02 ENCOUNTER — Ambulatory Visit (INDEPENDENT_AMBULATORY_CARE_PROVIDER_SITE_OTHER): Payer: Managed Care, Other (non HMO) | Admitting: Obstetrics and Gynecology

## 2017-03-02 ENCOUNTER — Encounter: Payer: Self-pay | Admitting: Obstetrics and Gynecology

## 2017-03-02 VITALS — BP 125/81 | HR 90 | Ht 66.0 in | Wt 149.5 lb

## 2017-03-02 DIAGNOSIS — N926 Irregular menstruation, unspecified: Secondary | ICD-10-CM

## 2017-03-02 LAB — POCT URINE PREGNANCY: Preg Test, Ur: POSITIVE — AB

## 2017-03-02 NOTE — Patient Instructions (Addendum)
Thank you for enrolling in [facility]. Please follow the instructions below to securely access your online medical record. MyChart allows you to send messages to your doctor, view your test results, renew your prescriptions, schedule appointments, and more.  How Do I Sign Up? 1. In your Internet browser, go to [email or web address]. 2. Click on the New  User? link in the Sign In box.  3. Enter your MyChart Access Code exactly as it appears below. You will not need to use this code after you have completed the sign-up process. If you do not sign up before the expiration date, you must request a new code. MyChart Access Code: [email or web address] Expires: 04/18/2017  4:40 PM  4. Enter the last four digits of your Social Security Number (xxxx) and Date of Birth (mm/dd/yyyy) as indicated and click Next. You will be taken to the next sign-up page. 5. Create a MyChart ID. This will be your MyChart login ID and cannot be changed, so think of one that is secure and easy to remember. 6. Create a MyChart password. You can change your password at any time. 7. Enter your Password Reset Question and Answer and click Next. This can be used at a later time if you forget your password.  8. Select your communication preference, and if applicable enter your e-mail address. You will receive e-mail notification when new information is available in MyChart by choosing to receive e-mail notifications and filling in your e-mail. 9. Click Sign In. You can now view your medical record.   Additional Information If you have questions, you can email [email or web address] or call [phone] to talk to our [facility] staff. Remember, MyChart is NOT to be used for urgent needs. For medical emergencies, dial 911.  First Trimester of Pregnancy The first trimester of pregnancy is from week 1 until the end of week 13 (months 1 through 3). A week after a sperm fertilizes an egg, the egg will implant on the wall  of the uterus. This embryo will begin to develop into a baby. Genes from you and your partner will form the baby. The female genes will determine whether the baby will be a boy or a girl. At 6-8 weeks, the eyes and face will be formed, and the heartbeat can be seen on ultrasound. At the end of 12 weeks, all the baby's organs will be formed. Now that you are pregnant, you will want to do everything you can to have a healthy baby. Two of the most important things are to get good prenatal care and to follow your health care provider's instructions. Prenatal care is all the medical care you receive before the baby's birth. This care will help prevent, find, and treat any problems during the pregnancy and childbirth. Body changes during your first trimester Your body goes through many changes during pregnancy. The changes vary from woman to woman.  You may gain or lose a couple of pounds at first.  You may feel sick to your stomach (nauseous) and you may throw up (vomit). If the vomiting is uncontrollable, call your health care provider.  You may tire easily.  You may develop headaches that can be relieved by medicines. All medicines should be approved by your health care provider.  You may urinate more often. Painful urination may mean you have a bladder infection.  You may develop heartburn as a result of your pregnancy.  You may develop constipation because certain hormones are causing the muscles that push  stool through your intestines to slow down.  You may develop hemorrhoids or swollen veins (varicose veins).  Your breasts may begin to grow larger and become tender. Your nipples may stick out more, and the tissue that surrounds them (areola) may become darker.  Your gums may bleed and may be sensitive to brushing and flossing.  Dark spots or blotches (chloasma, mask of pregnancy) may develop on your face. This will likely fade after the baby is born.  Your menstrual periods will  stop.  You may have a loss of appetite.  You may develop cravings for certain kinds of food.  You may have changes in your emotions from day to day, such as being excited to be pregnant or being concerned that something may go wrong with the pregnancy and baby.  You may have more vivid and strange dreams.  You may have changes in your hair. These can include thickening of your hair, rapid growth, and changes in texture. Some women also have hair loss during or after pregnancy, or hair that feels dry or thin. Your hair will most likely return to normal after your baby is born.  What to expect at prenatal visits During a routine prenatal visit:  You will be weighed to make sure you and the baby are growing normally.  Your blood pressure will be taken.  Your abdomen will be measured to track your baby's growth.  The fetal heartbeat will be listened to between weeks 10 and 14 of your pregnancy.  Test results from any previous visits will be discussed.  Your health care provider may ask you:  How you are feeling.  If you are feeling the baby move.  If you have had any abnormal symptoms, such as leaking fluid, bleeding, severe headaches, or abdominal cramping.  If you are using any tobacco products, including cigarettes, chewing tobacco, and electronic cigarettes.  If you have any questions.  Other tests that may be performed during your first trimester include:  Blood tests to find your blood type and to check for the presence of any previous infections. The tests will also be used to check for low iron levels (anemia) and protein on red blood cells (Rh antibodies). Depending on your risk factors, or if you previously had diabetes during pregnancy, you may have tests to check for high blood sugar that affects pregnant women (gestational diabetes).  Urine tests to check for infections, diabetes, or protein in the urine.  An ultrasound to confirm the proper growth and development  of the baby.  Fetal screens for spinal cord problems (spina bifida) and Down syndrome.  HIV (human immunodeficiency virus) testing. Routine prenatal testing includes screening for HIV, unless you choose not to have this test.  You may need other tests to make sure you and the baby are doing well.  Follow these instructions at home: Medicines  Follow your health care provider's instructions regarding medicine use. Specific medicines may be either safe or unsafe to take during pregnancy.  Take a prenatal vitamin that contains at least 600 micrograms (mcg) of folic acid.  If you develop constipation, try taking a stool softener if your health care provider approves. Eating and drinking  Eat a balanced diet that includes fresh fruits and vegetables, whole grains, good sources of protein such as meat, eggs, or tofu, and low-fat dairy. Your health care provider will help you determine the amount of weight gain that is right for you.  Avoid raw meat and uncooked cheese. These carry  germs that can cause birth defects in the baby.  Eating four or five small meals rather than three large meals a day may help relieve nausea and vomiting. If you start to feel nauseous, eating a few soda crackers can be helpful. Drinking liquids between meals, instead of during meals, also seems to help ease nausea and vomiting.  Limit foods that are high in fat and processed sugars, such as fried and sweet foods.  To prevent constipation: ? Eat foods that are high in fiber, such as fresh fruits and vegetables, whole grains, and beans. ? Drink enough fluid to keep your urine clear or pale yellow. Activity  Exercise only as directed by your health care provider. Most women can continue their usual exercise routine during pregnancy. Try to exercise for 30 minutes at least 5 days a week. Exercising will help you: ? Control your weight. ? Stay in shape. ? Be prepared for labor and delivery.  Experiencing pain or  cramping in the lower abdomen or lower back is a good sign that you should stop exercising. Check with your health care provider before continuing with normal exercises.  Try to avoid standing for long periods of time. Move your legs often if you must stand in one place for a long time.  Avoid heavy lifting.  Wear low-heeled shoes and practice good posture.  You may continue to have sex unless your health care provider tells you not to. Relieving pain and discomfort  Wear a good support bra to relieve breast tenderness.  Take warm sitz baths to soothe any pain or discomfort caused by hemorrhoids. Use hemorrhoid cream if your health care provider approves.  Rest with your legs elevated if you have leg cramps or low back pain.  If you develop varicose veins in your legs, wear support hose. Elevate your feet for 15 minutes, 3-4 times a day. Limit salt in your diet. Prenatal care  Schedule your prenatal visits by the twelfth week of pregnancy. They are usually scheduled monthly at first, then more often in the last 2 months before delivery.  Write down your questions. Take them to your prenatal visits.  Keep all your prenatal visits as told by your health care provider. This is important. Safety  Wear your seat belt at all times when driving.  Make a list of emergency phone numbers, including numbers for family, friends, the hospital, and police and fire departments. General instructions  Ask your health care provider for a referral to a local prenatal education class. Begin classes no later than the beginning of month 6 of your pregnancy.  Ask for help if you have counseling or nutritional needs during pregnancy. Your health care provider can offer advice or refer you to specialists for help with various needs.  Do not use hot tubs, steam rooms, or saunas.  Do not douche or use tampons or scented sanitary pads.  Do not cross your legs for long periods of time.  Avoid cat litter  boxes and soil used by cats. These carry germs that can cause birth defects in the baby and possibly loss of the fetus by miscarriage or stillbirth.  Avoid all smoking, herbs, alcohol, and medicines not prescribed by your health care provider. Chemicals in these products affect the formation and growth of the baby.  Do not use any products that contain nicotine or tobacco, such as cigarettes and e-cigarettes. If you need help quitting, ask your health care provider. You may receive counseling support and other resources  to help you quit.  Schedule a dentist appointment. At home, brush your teeth with a soft toothbrush and be gentle when you floss. Contact a health care provider if:  You have dizziness.  You have mild pelvic cramps, pelvic pressure, or nagging pain in the abdominal area.  You have persistent nausea, vomiting, or diarrhea.  You have a bad smelling vaginal discharge.  You have pain when you urinate.  You notice increased swelling in your face, hands, legs, or ankles.  You are exposed to fifth disease or chickenpox.  You are exposed to [place] measles (rubella) and have never had it. Get help right away if:  You have a fever.  You are leaking fluid from your vagina.  You have spotting or bleeding from your vagina.  You have severe abdominal cramping or pain.  You have rapid weight gain or loss.  You vomit blood or material that looks like coffee grounds.  You develop a severe headache.  You have shortness of breath.  You have any kind of trauma, such as from a fall or a car accident. Summary  The first trimester of pregnancy is from week 1 until the end of week 13 (months 1 through 3).  Your body goes through many changes during pregnancy. The changes vary from woman to woman.  You will have routine prenatal visits. During those visits, your health care provider will examine you, discuss any test results you may have, and talk with you about how you are  feeling. This information is not intended to replace advice given to you by your health care provider. Make sure you discuss any questions you have with your health care provider. Document Released: 08/30/2001 Document Revised: 08/17/2016 Document Reviewed: 08/17/2016 Elsevier Interactive Patient Education  2017 [employer].

## 2017-03-02 NOTE — Progress Notes (Signed)
Subjective:     Patient ID: Cynthia Harrell, female   DOB: [birth year], 31 y.o.   MRN: [id number]  HPI Here for pregnancy confirmation, reports LMP 01/26/17 with normal menses, and + UPT at home last week. Giving EDC 11/02/17 and EGA 5w G2P1, FOB Philip, supportive. Review of Systems Negative except fatigue.    Objective:   Physical Exam A&O x4 Well groomed female in no distress Blood pressure 125/81, pulse 90, height 5\' 6"  (1.676 m), weight 149 lb 8 oz (67.8 kg), last menstrual period 01/26/2017. UPT+Pelvic exam: normal external genitalia, vulva, vagina, cervix, uterus and adnexa, WET MOUNT done - results: negative for pathogens, normal epithelial cells.    Assessment:     Missed menses    Plan:     Viability scan in 2 weeks with nurse intake PNV samples given New OB physical in 6-7 weeks.  Vanassa Penniman [place], CNM

## 2017-03-17 ENCOUNTER — Ambulatory Visit (INDEPENDENT_AMBULATORY_CARE_PROVIDER_SITE_OTHER): Payer: Managed Care, Other (non HMO)

## 2017-03-17 ENCOUNTER — Ambulatory Visit: Payer: Managed Care, Other (non HMO) | Admitting: Obstetrics and Gynecology

## 2017-03-17 VITALS — BP 121/75 | HR 96 | Ht 66.0 in | Wt 150.8 lb

## 2017-03-17 DIAGNOSIS — Z3481 Encounter for supervision of other normal pregnancy, first trimester: Secondary | ICD-10-CM

## 2017-03-17 DIAGNOSIS — Z1389 Encounter for screening for other disorder: Secondary | ICD-10-CM

## 2017-03-17 DIAGNOSIS — N926 Irregular menstruation, unspecified: Secondary | ICD-10-CM

## 2017-03-17 DIAGNOSIS — Z113 Encounter for screening for infections with a predominantly sexual mode of transmission: Secondary | ICD-10-CM

## 2017-03-17 MED ORDER — CITRANATAL ASSURE 35-1 & 300 MG PO MISC: 1.0000 | Freq: Every day | ORAL | 11 refills | Status: DC

## 2017-03-17 NOTE — Progress Notes (Signed)
Cynthia Harrell presents for [facility] interview visit. Pregnancy confirmation done by [facility] on 03/02/17. UPT-Positive.  G-2.  P-1001. Ultrasound results were consistent with LMP: 01/26/2017, EDD: 11/02/2017. Pregnancy education material explained and given. No cats in the home. NOB labs ordered. Pt has hx of abnormal paps (last pap done 08/2016 was normal and has been normal for past 2 yrs)., hsv, current partner does not) and hx chlamydia.  HIV labs and Drug screen were explained optional and she did not decline. Drug screen ordered. PNV encouraged. FOB-scoliosis in family. Genetic screening options discussed. Genetic testing: Unsure. Pt to contact insurance companies for coverage.  Pt may discuss with provider. Pt. To follow up with provider as scheduled on 04/14/2017  for NOB physical.  All questions answered.

## 2017-03-17 NOTE — Patient Instructions (Signed)
Pregnancy and Zika Virus Disease Zika virus disease, or Zika, is an illness that can spread to people from mosquitoes that carry the virus. It may also spread from person to person through infected body fluids. Zika first occurred in [place], but recently it has spread to new areas. The virus occurs in tropical climates. The location of Zika continues to change. Most people who become infected with Zika virus do not develop serious illness. However, Zika may cause birth defects in an unborn baby whose mother is infected with the virus. It may also increase the risk of miscarriage. What are the symptoms of Zika virus disease? In many cases, people who have been infected with Zika virus do not develop any symptoms. If symptoms appear, they usually start about a week after the person is infected. Symptoms are usually mild. They may include:  Fever.  Rash.  Red eyes.  Joint pain.  How does Zika virus disease spread? The main way that Zika virus spreads is through the bite of a certain type of mosquito. Unlike most types of mosquitos, which bite only at night, the type of mosquito that carries Zika virus bites both at night and during the day. Zika virus can also spread through sexual contact, through a blood transfusion, and from a mother to her baby before or during birth. Once you have had Zika virus disease, it is unlikely that you will get it again. Can I pass Zika to my baby during pregnancy? Yes, Zika can pass from a mother to her baby before or during birth. What problems can Zika cause for my baby? A woman who is infected with Zika virus while pregnant is at risk of having her baby born with a condition in which the brain or head is smaller than expected (microcephaly). Babies who have microcephaly can have developmental delays, seizures, hearing problems, and vision problems. Having Zika virus disease during pregnancy can also increase the risk of miscarriage. How can Zika virus disease be  prevented? There is no vaccine to prevent Zika. The best way to prevent the disease is to avoid infected mosquitoes and avoid exposure to body fluids that can spread the virus. Avoid any possible exposure to Zika by taking the following precautions. For women and their sex partners:  Avoid traveling to high-risk areas. The locations where Zika is being reported change often. To identify high-risk areas, check the CDC travel website: www.cdc.gov/zika/geo/index.html  If you or your sex partner must travel to a high-risk area, talk with a health care provider before and after traveling.  Take all precautions to avoid mosquito bites if you live in, or travel to, any of the high-risk areas. Insect repellents are safe to use during pregnancy.  Ask your health care provider when it is safe to have sexual contact.  For women:  If you are pregnant or trying to become pregnant, avoid sexual contact with persons who may have been exposed to Zika virus, persons who have possible symptoms of Zika, or persons whose history you are unsure about. If you choose to have sexual contact with someone who may have been exposed to Zika virus, use condoms correctly during the entire duration of sexual activity, every time. Do not share sexual devices, as you may be exposed to body fluids.  Ask your health care provider about when it is safe to attempt pregnancy after a possible exposure to Zika virus.  What steps should I take to avoid mosquito bites? Take these steps to avoid mosquito bites   when you are in a high-risk area:  Wear loose clothing that covers your arms and legs.  Limit your outdoor activities.  Do not open windows unless they have window screens.  Sleep under mosquito nets.  Use insect repellent. The best insect repellents have:  DEET, picaridin, oil of lemon eucalyptus (OLE), or IR3535 in them.  Higher amounts of an active ingredient in them.  Remember that insect repellents are safe to  use during pregnancy.  Do not use OLE on children who are younger than 3 years of age. Do not use insect repellent on babies who are younger than 2 months of age.  Cover your child's stroller with mosquito netting. Make sure the netting fits snugly and that any loose netting does not cover your child's mouth or nose. Do not use a blanket as a mosquito-protection cover.  Do not apply insect repellent underneath clothing.  If you are using sunscreen, apply the sunscreen before applying the insect repellent.  Treat clothing with permethrin. Do not apply permethrin directly to your skin. Follow label directions for safe use.  Get rid of standing water, where mosquitoes may reproduce. Standing water is often found in items such as buckets, bowls, animal food dishes, and flowerpots.  When you return from traveling to any high-risk area, continue taking actions to protect yourself against mosquito bites for 3 weeks, even if you show no signs of illness. This will prevent spreading Zika virus to uninfected mosquitoes. What should I know about the sexual transmission of Zika? People can spread Zika to their sexual partners during vaginal, anal, or oral sex, or by sharing sexual devices. Many people with Zika do not develop symptoms, so a person could spread the disease without knowing that they are infected. The greatest risk is to women who are pregnant or who may become pregnant. Zika virus can live longer in semen than it can live in blood. Couples can prevent sexual transmission of the virus by:  Using condoms correctly during the entire duration of sexual activity, every time. This includes vaginal, anal, and oral sex.  Not sharing sexual devices. Sharing increases your risk of being exposed to body fluid from another person.  Avoiding all sexual activity until your health care provider says it is safe.  Should I be tested for Zika virus? A sample of your blood can be tested for Zika virus. A  pregnant woman should be tested if she may have been exposed to the virus or if she has symptoms of Zika. She may also have additional tests done during her pregnancy, such ultrasound testing. Talk with your health care provider about which tests are recommended. This information is not intended to replace advice given to you by your health care provider. Make sure you discuss any questions you have with your health care provider. Document Released: 05/27/2015 Document Revised: 02/11/2016 Document Reviewed: 05/20/2015 Elsevier Interactive Patient Education  2018 [employer] Hyperemesis Gravidarum Hyperemesis gravidarum is a severe form of nausea and vomiting that happens during pregnancy. Hyperemesis is worse than morning sickness. It may cause you to have nausea or vomiting all day for many days. It may keep you from eating and drinking enough food and liquids. Hyperemesis usually occurs during the first half (the first 20 weeks) of pregnancy. It often goes away once a woman is in her second half of pregnancy. However, sometimes hyperemesis continues through an entire pregnancy. What are the causes? The cause of this condition is not known. It may be related   to changes in chemicals (hormones) in the body during pregnancy, such as the high level of pregnancy hormone (human chorionic gonadotropin) or the increase in the female sex hormone (estrogen). What are the signs or symptoms? Symptoms of this condition include:  Severe nausea and vomiting.  Nausea that does not go away.  Vomiting that does not allow you to keep any food down.  Weight loss.  Body fluid loss (dehydration).  Having no desire to eat, or not liking food that you have previously enjoyed.  How is this diagnosed? This condition may be diagnosed based on:  A physical exam.  Your medical history.  Your symptoms.  Blood tests.  Urine tests.  How is this treated? This condition may be managed with medicine. If  medicines to do not help relieve nausea and vomiting, you may need to receive fluids through an IV tube at the hospital. Follow these instructions at home:  Take over-the-counter and prescription medicines only as told by your health care provider.  Avoid iron pills and multivitamins that contain iron for the first 3-4 months of pregnancy. If you take prescription iron pills, do not stop taking them unless your health care provider approves.  Take the following actions to help prevent nausea and vomiting: ? In the morning, before getting out of bed, try eating a couple of dry crackers or a piece of toast. ? Avoid foods and smells that upset your stomach. Fatty and spicy foods may make nausea worse. ? Eat 5-6 small meals a day. ? Do not drink fluids while eating meals. Drink between meals. ? Eat or suck on things that have ginger in them. Ginger can help relieve nausea. ? Avoid food preparation. The smell of food can spoil your appetite or trigger nausea.  Follow instructions from your health care provider about eating or drinking restrictions.  For snacks, eat high-protein foods, such as cheese.  Keep all follow-up and pre-birth (prenatal) visits as told by your health care provider. This is important. Contact a health care provider if:  You have pain in your abdomen.  You have a severe headache.  You have vision problems.  You are losing weight. Get help right away if:  You cannot drink fluids without vomiting.  You vomit blood.  You have constant nausea and vomiting.  You are very weak.  You are very thirsty.  You feel dizzy.  You faint.  You have a fever or other symptoms that last for more than 2-3 days.  You have a fever and your symptoms suddenly get worse. Summary  Hyperemesis gravidarum is a severe form of nausea and vomiting that happens during pregnancy.  Making some changes to your eating habits may help relieve nausea and vomiting.  This condition may  be managed with medicine.  If medicines to do not help relieve nausea and vomiting, you may need to receive fluids through an IV tube at the hospital. This information is not intended to replace advice given to you by your health care provider. Make sure you discuss any questions you have with your health care provider. Document Released: 09/05/2005 Document Revised: 05/04/2016 Document Reviewed: 05/04/2016 Elsevier Interactive Patient Education  2017 [employer] First Trimester of Pregnancy The first trimester of pregnancy is from week 1 until the end of week 13 (months 1 through 3). During this time, your baby will begin to develop inside you. At 6-8 weeks, the eyes and face are formed, and the heartbeat can be seen on ultrasound. At the   end of 12 weeks, all the baby's organs are formed. Prenatal care is all the medical care you receive before the birth of your baby. Make sure you get good prenatal care and follow all of your doctor's instructions. Follow these instructions at home: Medicines  Take over-the-counter and prescription medicines only as told by your doctor. Some medicines are safe and some medicines are not safe during pregnancy.  Take a prenatal vitamin that contains at least 600 micrograms (mcg) of folic acid.  If you have trouble pooping (constipation), take medicine that will make your stool soft (stool softener) if your doctor approves. Eating and drinking  Eat regular, healthy meals.  Your doctor will tell you the amount of weight gain that is right for you.  Avoid raw meat and uncooked cheese.  If you feel sick to your stomach (nauseous) or throw up (vomit): ? Eat 4 or 5 small meals a day instead of 3 large meals. ? Try eating a few soda crackers. ? Drink liquids between meals instead of during meals.  To prevent constipation: ? Eat foods that are high in fiber, like fresh fruits and vegetables, whole grains, and beans. ? Drink enough fluids to keep your pee  (urine) clear or pale yellow. Activity  Exercise only as told by your doctor. Stop exercising if you have cramps or pain in your lower belly (abdomen) or low back.  Do not exercise if it is too hot, too humid, or if you are in a place of great height (high altitude).  Try to avoid standing for long periods of time. Move your legs often if you must stand in one place for a long time.  Avoid heavy lifting.  Wear low-heeled shoes. Sit and stand up straight.  You can have sex unless your doctor tells you not to. Relieving pain and discomfort  Wear a good support bra if your breasts are sore.  Take warm water baths (sitz baths) to soothe pain or discomfort caused by hemorrhoids. Use hemorrhoid cream if your doctor says it is okay.  Rest with your legs raised if you have leg cramps or low back pain.  If you have puffy, bulging veins (varicose veins) in your legs: ? Wear support hose or compression stockings as told by your doctor. ? Raise (elevate) your feet for 15 minutes, 3-4 times a day. ? Limit salt in your food. Prenatal care  Schedule your prenatal visits by the twelfth week of pregnancy.  Write down your questions. Take them to your prenatal visits.  Keep all your prenatal visits as told by your doctor. This is important. Safety  Wear your seat belt at all times when driving.  Make a list of emergency phone numbers. The list should include numbers for family, friends, the hospital, and police and fire departments. General instructions  Ask your doctor for a referral to a local prenatal class. Begin classes no later than at the start of month 6 of your pregnancy.  Ask for help if you need counseling or if you need help with nutrition. Your doctor can give you advice or tell you where to go for help.  Do not use hot tubs, steam rooms, or saunas.  Do not douche or use tampons or scented sanitary pads.  Do not cross your legs for long periods of time.  Avoid all herbs  and alcohol. Avoid drugs that are not approved by your doctor.  Do not use any tobacco products, including cigarettes, chewing tobacco, and electronic cigarettes.   If you need help quitting, ask your doctor. You may get counseling or other support to help you quit.  Avoid cat litter boxes and soil used by cats. These carry germs that can cause birth defects in the baby and can cause a loss of your baby (miscarriage) or stillbirth.  Visit your dentist. At home, brush your teeth with a soft toothbrush. Be gentle when you floss. Contact a doctor if:  You are dizzy.  You have mild cramps or pressure in your lower belly.  You have a nagging pain in your belly area.  You continue to feel sick to your stomach, you throw up, or you have watery poop (diarrhea).  You have a bad smelling fluid coming from your vagina.  You have pain when you pee (urinate).  You have increased puffiness (swelling) in your face, hands, legs, or ankles. Get help right away if:  You have a fever.  You are leaking fluid from your vagina.  You have spotting or bleeding from your vagina.  You have very bad belly cramping or pain.  You gain or lose weight rapidly.  You throw up blood. It may look like coffee grounds.  You are around people who have German measles, fifth disease, or chickenpox.  You have a very bad headache.  You have shortness of breath.  You have any kind of trauma, such as from a fall or a car accident. Summary  The first trimester of pregnancy is from week 1 until the end of week 13 (months 1 through 3).  To take care of yourself and your unborn baby, you will need to eat healthy meals, take medicines only if your doctor tells you to do so, and do activities that are safe for you and your baby.  Keep all follow-up visits as told by your doctor. This is important as your doctor will have to ensure that your baby is healthy and growing well. This information is not intended to replace  advice given to you by your health care provider. Make sure you discuss any questions you have with your health care provider. Document Released: 02/22/2008 Document Revised: 09/13/2016 Document Reviewed: 09/13/2016 Elsevier Interactive Patient Education  2017 [employer] Commonly Asked Questions During Pregnancy  Cats: A parasite can be excreted in cat feces.  To avoid exposure you need to have another person empty the little box.  If you must empty the litter box you will need to wear gloves.  Wash your hands after handling your cat.  This parasite can also be found in raw or undercooked meat so this should also be avoided.  Colds, Sore Throats, Flu: Please check your medication sheet to see what you can take for symptoms.  If your symptoms are unrelieved by these medications please call the office.  Dental Work: Most any dental work your dentist recommends is permitted.  X-rays should only be taken during the first trimester if absolutely necessary.  Your abdomen should be shielded with a lead apron during all x-rays.  Please notify your provider prior to receiving any x-rays.  Novocaine is fine; gas is not recommended.  If your dentist requires a note from us prior to dental work please call the office and we will provide one for you.  Exercise: Exercise is an important part of staying healthy during your pregnancy.  You may continue most exercises you were accustomed to prior to pregnancy.  Later in your pregnancy you will most likely notice you have difficulty with activities   requiring balance like riding a bicycle.  It is important that you listen to your body and avoid activities that put you at a higher risk of falling.  Adequate rest and staying well hydrated are a must!  If you have questions about the safety of specific activities ask your provider.    Exposure to Children with illness: Try to avoid obvious exposure; report any symptoms to us when noted,  If you have chicken pos, red  measles or mumps, you should be immune to these diseases.   Please do not take any vaccines while pregnant unless you have checked with your OB provider.  Fetal Movement: After 28 weeks we recommend you do "kick counts" twice daily.  Lie or sit down in a calm quiet environment and count your baby movements "kicks".  You should feel your baby at least 10 times per hour.  If you have not felt 10 kicks within the first hour get up, walk around and have something sweet to eat or drink then repeat for an additional hour.  If count remains less than 10 per hour notify your provider.  Fumigating: Follow your pest control agent's advice as to how long to stay out of your home.  Ventilate the area well before re-entering.  Hemorrhoids:   Most over-the-counter preparations can be used during pregnancy.  Check your medication to see what is safe to use.  It is important to use a stool softener or fiber in your diet and to drink lots of liquids.  If hemorrhoids seem to be getting worse please call the office.   Hot Tubs:  Hot tubs Jacuzzis and saunas are not recommended while pregnant.  These increase your internal body temperature and should be avoided.  Intercourse:  Sexual intercourse is safe during pregnancy as long as you are comfortable, unless otherwise advised by your provider.  Spotting may occur after intercourse; report any bright red bleeding that is heavier than spotting.  Labor:  If you know that you are in labor, please go to the hospital.  If you are unsure, please call the office and let us help you decide what to do.  Lifting, straining, etc:  If your job requires heavy lifting or straining please check with your provider for any limitations.  Generally, you should not lift items heavier than that you can lift simply with your hands and arms (no back muscles)  Painting:  Paint fumes do not harm your pregnancy, but may make you ill and should be avoided if possible.  Latex or water based paints  have less odor than oils.  Use adequate ventilation while painting.  Permanents & Hair Color:  Chemicals in hair dyes are not recommended as they cause increase hair dryness which can increase hair loss during pregnancy.  " Highlighting" and permanents are allowed.  Dye may be absorbed differently and permanents may not hold as well during pregnancy.  Sunbathing:  Use a sunscreen, as skin burns easily during pregnancy.  Drink plenty of fluids; avoid over heating.  Tanning Beds:  Because their possible side effects are still unknown, tanning beds are not recommended.  Ultrasound Scans:  Routine ultrasounds are performed at approximately 20 weeks.  You will be able to see your baby's general anatomy an if you would like to know the gender this can usually be determined as well.  If it is questionable when you conceived you may also receive an ultrasound early in your pregnancy for dating purposes.  Otherwise ultrasound exams   are not routinely performed unless there is a medical necessity.  Although you can request a scan we ask that you pay for it when conducted because insurance does not cover " patient request" scans.  Work: If your pregnancy proceeds without complications you may work until your due date, unless your physician or employer advises otherwise.  Round Ligament Pain/Pelvic Discomfort:  Sharp, shooting pains not associated with bleeding are fairly common, usually occurring in the second trimester of pregnancy.  They tend to be worse when standing up or when you remain standing for long periods of time.  These are the result of pressure of certain pelvic ligaments called "round ligaments".  Rest, Tylenol and heat seem to be the most effective relief.  As the womb and fetus grow, they rise out of the pelvis and the discomfort improves.  Please notify the office if your pain seems different than that described.  It may represent a more serious condition.   

## 2017-03-18 LAB — CBC WITH DIFFERENTIAL/PLATELET
BASOS: 0 %
Basophils Absolute: 0 10*3/uL (ref 0.0–0.2)
EOS (ABSOLUTE): 0 10*3/uL (ref 0.0–0.4)
EOS: 0 %
HEMATOCRIT: 38.2 % (ref 34.0–46.6)
Hemoglobin: 12.5 g/dL (ref 11.1–15.9)
IMMATURE GRANULOCYTES: 0 %
Immature Grans (Abs): 0 10*3/uL (ref 0.0–0.1)
LYMPHS ABS: 1.4 10*3/uL (ref 0.7–3.1)
Lymphs: 14 %
MCH: 31.7 pg (ref 26.6–33.0)
MCHC: 32.7 g/dL (ref 31.5–35.7)
MCV: 97 fL (ref 79–97)
MONOS ABS: 0.5 10*3/uL (ref 0.1–0.9)
Monocytes: 5 %
NEUTROS ABS: 8.2 10*3/uL — AB (ref 1.4–7.0)
NEUTROS PCT: 81 %
Platelets: 237 10*3/uL (ref 150–379)
RBC: 3.94 x10E6/uL (ref 3.77–5.28)
RDW: 12.5 % (ref 12.3–15.4)
WBC: 10.1 10*3/uL (ref 3.4–10.8)

## 2017-03-18 LAB — RH TYPE: RH TYPE: POSITIVE

## 2017-03-18 LAB — ABO

## 2017-03-18 LAB — HIV ANTIBODY (ROUTINE TESTING W REFLEX): HIV Screen 4th Generation wRfx: NONREACTIVE

## 2017-03-18 LAB — VARICELLA ZOSTER ANTIBODY, IGG: VARICELLA: 2037 {index} (ref >165)

## 2017-03-18 LAB — ANTIBODY SCREEN: Antibody Screen: NEGATIVE

## 2017-03-18 LAB — RPR: RPR Ser Ql: NONREACTIVE

## 2017-03-18 LAB — HEPATITIS B SURFACE ANTIGEN: Hepatitis B Surface Ag: NEGATIVE

## 2017-03-18 LAB — RUBELLA SCREEN: Rubella Antibodies, IGG: 6.5 index (ref >0.99)

## 2017-03-19 LAB — CULTURE, OB URINE

## 2017-03-19 LAB — GC/CHLAMYDIA PROBE AMP
Chlamydia trachomatis, NAA: NEGATIVE
Neisseria gonorrhoeae by PCR: NEGATIVE

## 2017-03-19 LAB — URINE CULTURE, OB REFLEX: ORGANISM ID, BACTERIA: NO GROWTH

## 2017-03-20 LAB — URINALYSIS, ROUTINE W REFLEX MICROSCOPIC
Bilirubin, UA: NEGATIVE
GLUCOSE, UA: NEGATIVE
KETONES UA: NEGATIVE
LEUKOCYTES UA: NEGATIVE
NITRITE UA: NEGATIVE
Protein, UA: NEGATIVE
RBC, UA: NEGATIVE
SPEC GRAV UA: 1.006 (ref 1.005–1.030)
Urobilinogen, Ur: 0.2 mg/dL (ref 0.2–1.0)
pH, UA: 7.5 (ref 5.0–7.5)

## 2017-03-20 LAB — MONITOR DRUG PROFILE 14(MW)
AMPHETAMINE SCREEN URINE: NEGATIVE ng/mL
BARBITURATE SCREEN URINE: NEGATIVE ng/mL
BENZODIAZEPINE SCREEN, URINE: NEGATIVE ng/mL
BUPRENORPHINE, URINE: NEGATIVE ng/mL
CANNABINOIDS UR QL SCN: NEGATIVE ng/mL
Cocaine (Metab) Scrn, Ur: NEGATIVE ng/mL
Creatinine(Crt), U: 30.4 mg/dL (ref 20.0–300.0)
Fentanyl, Urine: NEGATIVE pg/mL
Meperidine Screen, Urine: NEGATIVE ng/mL
Methadone Screen, Urine: NEGATIVE ng/mL
OXYCODONE+OXYMORPHONE UR QL SCN: NEGATIVE ng/mL
Opiate Scrn, Ur: NEGATIVE ng/mL
PH UR, DRUG SCRN: 6.8 (ref 4.5–8.9)
PHENCYCLIDINE QUANTITATIVE URINE: NEGATIVE ng/mL
PROPOXYPHENE SCREEN URINE: NEGATIVE ng/mL
SPECIFIC GRAVITY: 1.006
Tramadol Screen, Urine: NEGATIVE ng/mL

## 2017-03-20 LAB — NICOTINE SCREEN, URINE: Cotinine Ql Scrn, Ur: NEGATIVE ng/mL

## 2017-03-24 ENCOUNTER — Encounter: Payer: Self-pay | Admitting: Obstetrics and Gynecology

## 2017-04-14 ENCOUNTER — Encounter: Payer: Managed Care, Other (non HMO) | Admitting: Obstetrics and Gynecology

## 2017-04-18 ENCOUNTER — Ambulatory Visit (INDEPENDENT_AMBULATORY_CARE_PROVIDER_SITE_OTHER): Payer: Managed Care, Other (non HMO) | Admitting: Obstetrics and Gynecology

## 2017-04-18 ENCOUNTER — Encounter: Payer: Self-pay | Admitting: Obstetrics and Gynecology

## 2017-04-18 ENCOUNTER — Ambulatory Visit: Payer: Self-pay | Admitting: Physician Assistant

## 2017-04-18 VITALS — BP 118/74 | HR 99 | Wt 153.0 lb

## 2017-04-18 DIAGNOSIS — Z3401 Encounter for supervision of normal first pregnancy, first trimester: Secondary | ICD-10-CM | POA: Diagnosis not present

## 2017-04-18 LAB — POCT URINALYSIS DIPSTICK
Bilirubin, UA: NEGATIVE
GLUCOSE UA: NEGATIVE
Ketones, UA: NEGATIVE
Leukocytes, UA: NEGATIVE
NITRITE UA: NEGATIVE
PH UA: 7 (ref 5.0–8.0)
PROTEIN UA: NEGATIVE
RBC UA: 0
Spec Grav, UA: 1.01 (ref 1.010–1.025)
UROBILINOGEN UA: 0.2 U/dL

## 2017-04-18 MED ORDER — CLOBETASOL PROPIONATE 0.05 % EX OINT: 1.0000 "application " | TOPICAL_OINTMENT | Freq: Two times a day (BID) | CUTANEOUS | 2 refills | Status: DC

## 2017-04-18 NOTE — Addendum Note (Signed)
Addended by: Keturah Barre L on: 04/18/2017 11:36 AM   Modules accepted: Orders

## 2017-04-18 NOTE — Progress Notes (Signed)
NEW OB HISTORY AND PHYSICAL  SUBJECTIVE:       Cynthia Harrell is a 31 y.o. [id number] female, Patient's last menstrual period was 01/26/2017 (approximate)., Estimated Date of Delivery: 11/02/17, [redacted]w[redacted]d, presents today for establishment of Prenatal Care. She has no unusual complaints and complains of fatigue      Gynecologic History Patient's last menstrual period was 01/26/2017 (approximate). Normal Contraception: none Last Pap: 2018. Results were: normal  Obstetric History OB History  Gravida Para Term Preterm AB Living  3 1     1 1   SAB TAB Ectopic Multiple Live Births    1     1    # Outcome Date GA Lbr Len/2nd Weight Sex Delivery Anes PTL Lv  3 Current           2 Para 12/04/06 [redacted]w[redacted]d  8 lb 1 oz (3.657 kg) F Vag-Spont EPI N LIV  1 TAB      TAB         Past Medical History:  Diagnosis Date  . 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 High-risk sexual behavior   . Microalbuminuria     Past Surgical History:  Procedure Laterality Date  . THERAPEUTIC ABORTION  2008    Current Outpatient Prescriptions on File Prior to Visit  Medication Sig Dispense Refill  . Prenat w/o A-[id number]-[id number] (CITRANATAL ASSURE) 35-1 & 300 MG tablet Take 1 tablet by mouth daily. 30 tablet 11  . valACYclovir (VALTREX) 500 MG tablet TAKE 1 TABLET (500 MG TOTAL) BY MOUTH 2 (TWO) TIMES DAILY. (Patient not taking: Reported on 04/18/2017) 60 tablet 6   No current facility-administered medications on file prior to visit.     No Known Allergies  Social History   Social History  . Marital status: Single    Spouse name: N/A  . Number of children: N/A  . Years of education: N/A   Occupational History  . Not on file.   Social History Main Topics  . Smoking status: Never Smoker  . Smokeless tobacco: Never Used  . Alcohol use No  . Drug use: No  . Sexual activity: Yes   Other Topics Concern  . Not on file   Social History Narrative  . No narrative on file    Family History  Problem  Relation Age of Onset  . Diabetes Father   . Hypertension Mother   . Cancer Mother        melanoma  . Stroke Maternal Grandfather   . Stroke Paternal Grandfather   . COPD Neg Hx   . Heart disease Neg Hx     The following portions of the patient's history were reviewed and updated as appropriate: allergies, current medications, past OB history, past medical history, past surgical history, past family history, past social history, and problem list.    OBJECTIVE: Initial Physical Exam (New OB)  GENERAL APPEARANCE: alert, well appearing, in no apparent distress, oriented to person, place and time HEAD: normocephalic, atraumatic MOUTH: mucous membranes moist, pharynx normal without lesions and dental hygiene good THYROID: no thyromegaly or masses present BREASTS: no masses noted, no significant tenderness, no palpable axillary nodes, no skin changes LUNGS: clear to auscultation, no wheezes, rales or rhonchi, symmetric air entry HEART: regular rate and rhythm, no murmurs ABDOMEN: soft, nontender, nondistended, no abnormal masses, no epigastric pain, fundus not palpable and FHT present EXTREMITIES: no redness or tenderness in the calves or thighs SKIN: normal coloration and turgor, no rashes LYMPH NODES: no adenopathy palpable  NEUROLOGIC: alert, oriented, normal speech, no focal findings or movement disorder noted  PELVIC EXAM EXTERNAL GENITALIA: normal appearing vulva with no masses, tenderness or lesions, small scaring at 6 oclock at introitus VAGINA: no abnormal discharge or lesions CERVIX: no lesions or cervical motion tenderness UTERUS: gravid and consistent with 12 weeks  ASSESSMENT: Normal pregnancy HSV  PLAN: Prenatal care Desires genetic screening  See orders

## 2017-04-18 NOTE — Patient Instructions (Addendum)
Second Trimester of Pregnancy The second trimester is from week 14 through week 27 (months 4 through 6). The second trimester is often a time when you feel your best. Your body has adjusted to being pregnant, and you begin to feel better physically. Usually, morning sickness has lessened or quit completely, you may have more energy, and you may have an increase in appetite. The second trimester is also a time when the fetus is growing rapidly. At the end of the sixth month, the fetus is about 9 inches long and weighs about 1 pounds. You will likely begin to feel the baby move (quickening) between 16 and 20 weeks of pregnancy. Body changes during your second trimester Your body continues to go through many changes during your second trimester. The changes vary from woman to woman.  Your weight will continue to increase. You will notice your lower abdomen bulging out.  You may begin to get stretch marks on your hips, abdomen, and breasts.  You may develop headaches that can be relieved by medicines. The medicines should be approved by your health care provider.  You may urinate more often because the fetus is pressing on your bladder.  You may develop or continue to have heartburn as a result of your pregnancy.  You may develop constipation because certain hormones are causing the muscles that push waste through your intestines to slow down.  You may develop hemorrhoids or swollen, bulging veins (varicose veins).  You may have back pain. This is caused by: ? Weight gain. ? Pregnancy hormones that are relaxing the joints in your pelvis. ? A shift in weight and the muscles that support your balance.  Your breasts will continue to grow and they will continue to become tender.  Your gums may bleed and may be sensitive to brushing and flossing.  Dark spots or blotches (chloasma, mask of pregnancy) may develop on your face. This will likely fade after the baby is born.  A dark line from your  belly button to the pubic area (linea nigra) may appear. This will likely fade after the baby is born.  You may have changes in your hair. These can include thickening of your hair, rapid growth, and changes in texture. Some women also have hair loss during or after pregnancy, or hair that feels dry or thin. Your hair will most likely return to normal after your baby is born.  What to expect at prenatal visits During a routine prenatal visit:  You will be weighed to make sure you and the fetus are growing normally.  Your blood pressure will be taken.  Your abdomen will be measured to track your baby's growth.  The fetal heartbeat will be listened to.  Any test results from the previous visit will be discussed.  Your health care provider may ask you:  How you are feeling.  If you are feeling the baby move.  If you have had any abnormal symptoms, such as leaking fluid, bleeding, severe headaches, or abdominal cramping.  If you are using any tobacco products, including cigarettes, chewing tobacco, and electronic cigarettes.  If you have any questions.  Other tests that may be performed during your second trimester include:  Blood tests that check for: ? Low iron levels (anemia). ? High blood sugar that affects pregnant women (gestational diabetes) between 24 and 28 weeks. ? Rh antibodies. This is to check for a protein on red blood cells (Rh factor).  Urine tests to check for infections, diabetes, or   protein in the urine.  An ultrasound to confirm the proper growth and development of the baby.  An amniocentesis to check for possible genetic problems.  Fetal screens for spina bifida and Down syndrome.  HIV (human immunodeficiency virus) testing. Routine prenatal testing includes screening for HIV, unless you choose not to have this test.  Follow these instructions at home: Medicines  Follow your health care provider's instructions regarding medicine use. Specific  medicines may be either safe or unsafe to take during pregnancy.  Take a prenatal vitamin that contains at least 600 micrograms (mcg) of folic acid.  If you develop constipation, try taking a stool softener if your health care provider approves. Eating and drinking  Eat a balanced diet that includes fresh fruits and vegetables, whole grains, good sources of protein such as meat, eggs, or tofu, and low-fat dairy. Your health care provider will help you determine the amount of weight gain that is right for you.  Avoid raw meat and uncooked cheese. These carry germs that can cause birth defects in the baby.  If you have low calcium intake from food, talk to your health care provider about whether you should take a daily calcium supplement.  Limit foods that are high in fat and processed sugars, such as fried and sweet foods.  To prevent constipation: ? Drink enough fluid to keep your urine clear or pale yellow. ? Eat foods that are high in fiber, such as fresh fruits and vegetables, whole grains, and beans. Activity  Exercise only as directed by your health care provider. Most women can continue their usual exercise routine during pregnancy. Try to exercise for 30 minutes at least 5 days a week. Stop exercising if you experience uterine contractions.  Avoid heavy lifting, wear low heel shoes, and practice good posture.  A sexual relationship may be continued unless your health care provider directs you otherwise. Relieving pain and discomfort  Wear a good support bra to prevent discomfort from breast tenderness.  Take warm sitz baths to soothe any pain or discomfort caused by hemorrhoids. Use hemorrhoid cream if your health care provider approves.  Rest with your legs elevated if you have leg cramps or low back pain.  If you develop varicose veins, wear support hose. Elevate your feet for 15 minutes, 3-4 times a day. Limit salt in your diet. Prenatal Care  Write down your questions.  Take them to your prenatal visits.  Keep all your prenatal visits as told by your health care provider. This is important. Safety  Wear your seat belt at all times when driving.  Make a list of emergency phone numbers, including numbers for family, friends, the hospital, and police and fire departments. General instructions  Ask your health care provider for a referral to a local prenatal education class. Begin classes no later than the beginning of month 6 of your pregnancy.  Ask for help if you have counseling or nutritional needs during pregnancy. Your health care provider can offer advice or refer you to specialists for help with various needs.  Do not use hot tubs, steam rooms, or saunas.  Do not douche or use tampons or scented sanitary pads.  Do not cross your legs for long periods of time.  Avoid cat litter boxes and soil used by cats. These carry germs that can cause birth defects in the baby and possibly loss of the fetus by miscarriage or stillbirth.  Avoid all smoking, herbs, alcohol, and unprescribed drugs. Chemicals in these products can   affect the formation and growth of the baby.  Do not use any products that contain nicotine or tobacco, such as cigarettes and e-cigarettes. If you need help quitting, ask your health care provider.  Visit your dentist if you have not gone yet during your pregnancy. Use a soft toothbrush to brush your teeth and be gentle when you floss. Contact a health care provider if:  You have dizziness.  You have mild pelvic cramps, pelvic pressure, or nagging pain in the abdominal area.  You have persistent nausea, vomiting, or diarrhea.  You have a bad smelling vaginal discharge.  You have pain when you urinate. Get help right away if:  You have a fever.  You are leaking fluid from your vagina.  You have spotting or bleeding from your vagina.  You have severe abdominal cramping or pain.  You have rapid weight gain or weight  loss.  You have shortness of breath with chest pain.  You notice sudden or extreme swelling of your face, hands, ankles, feet, or legs.  You have not felt your baby move in over an hour.  You have severe headaches that do not go away when you take medicine.  You have vision changes. Summary  The second trimester is from week 14 through week 27 (months 4 through 6). It is also a time when the fetus is growing rapidly.  Your body goes through many changes during pregnancy. The changes vary from woman to woman.  Avoid all smoking, herbs, alcohol, and unprescribed drugs. These chemicals affect the formation and growth your baby.  Do not use any tobacco products, such as cigarettes, chewing tobacco, and e-cigarettes. If you need help quitting, ask your health care provider.  Contact your health care provider if you have any questions. Keep all prenatal visits as told by your health care provider. This is important. This information is not intended to replace advice given to you by your health care provider. Make sure you discuss any questions you have with your health care provider. Document Released: 08/30/2001 Document Revised: 02/11/2016 Document Reviewed: 11/06/2012 Elsevier Interactive Patient Education  2017 [place]. Skin Conditions During Pregnancy Pregnancy affects many parts of your body. One part is your skin. Most skin problems that develop during pregnancy are not serious and are considered a normal part of pregnancy. They go away on their own after the baby is born. Other skin problems may need treatment. What type of skin problems can develop during pregnancy?  Stretch marks. Stretch marks are purple or pink lines on the skin. They may appear on the belly, breasts, thighs, or buttocks. Stretch marks are caused by weight gain that causes the skin to stretch. Stretch marks do not cause problems. Almost all women get them during pregnancy.  Darkening of the skin  (hyperpigmentation). The darkening may occur in patches or as a line. Patches may appear on the face, nipples, or genital area. Lines often stretch from the belly button to the pubic area. Hyperpigmentation develops in almost all pregnant women. It is more severe in women with a dark complexion.  Spider angiomas. These are tiny pink or red lines that go out from a center point, like the legs of a spider. Usually, they are on the face, neck, and arms. They do not cause problems. They are most common in women with light complexions.  Palmar erythema. This is a reddening of the palms. It is most common in women with light complexions.  Swelling and redness. This can  occur on the face, eyelids, fingers, or toes.  Pruritic urticarial papules and plaques of pregnancy (PUPPP). This is a rash that is itchy, red, and has tiny blisters. The cause is unknown. It usually starts on the abdomen and may affect the arms or legs. It does not affect the face. It usually begins later in pregnancy. About a third of all pregnant women develop this condition. There are no associated problems to the fetus with this rash. Sometimes, oral steroids are used to calm down the itch. The rash clears after the baby is born.  Prurigo of pregnancy. This is a disease in which red patches and bumps appear on the arms and legs. The cause is unknown. The patches and bumps clear after the baby is born. About a third of pregnant women develop this disease.  Acne. Pimples may develop, including in women who have had clear skin for a long time.  Skin tags. These are small flaps of skin that stick out from the body. They may grow or become darker during pregnancy. They are usually harmless.  Moles. These are flat or slightly raised growths. They are usually round and pink or brown. They may grow or become darker during pregnancy.  Intrahepatic cholestasis of pregnancy. This is a rare condition that causes itchy skin. It may run in  families. It increases the risk of complications for the fetus. This condition usually resolves after delivery. It can recur with subsequent pregnancies.  Impetigo herpetiformis. This is a form of a severe skin disease called pustular psoriasis. Usually, delivery is the only method of resolving the condition.  Pruritic folliculitis of pregnancy. This is a rare condition that causes pimple-like skin growths. It develops in the middle or later stages of pregnancy. Its cause is unknown.It usually resolves 2-3 weeks after delivery.  Pemphigoid gestationis. This is a very rare autoimmune disease. It causes a severely itchy rash and blisters. The rash does not appear on the face, scalp, or inside of the mouth. It usually resolves 3 months after delivery. It may recur with subsequent pregnancies. Some pre-existing skin conditions, such as atopic dermatitis, may become worse during pregnancy. Follow these instructions at home: Different conditions may have different instructions. In general:  Follow all your health care provider's directions about medicines to treat skin problems while you are pregnant. Do not use any over-the-counter medicines (including medicated creams and lotions) until you have checked with your health care provider. Many medicines are not safe to use when you are pregnant.  Avoid time in the sun. This will help keep your skin from darkening. When you must be outside, use sunscreen and wear a hat with a wide brim to protect your face. The sunscreen should have a SPF of at least 71. This may help limit dark spots that develop when the skin is exposed to the sun.  To avoid problems from stretched skin: ? Do not sit or stand for long periods of time. ? Exercise regularly. This helps keep your skin in good condition.  Use a gentle soap. This helps prevent acne.  Do not get too hot or too sweaty. This makes some skin rashes worse.  Wear loose clothes made of a soft fabric. This  prevents skin irritation.  For itching, add oatmeal or cornstarch to your bathwater.  Use a skin moisturizer. Ask your health care provider for suggestions.  This information is not intended to replace advice given to you by your health care provider. Make sure you discuss any  questions you have with your health care provider. Document Released: 10/08/2010 Document Revised: 02/11/2016 Document Reviewed: 06/17/2013 Elsevier Interactive Patient Education  [employer].

## 2017-04-18 NOTE — Progress Notes (Signed)
NOB physical-pt is doing well, would like to discuss genetic testing

## 2017-04-24 LAB — MATERNIT 21 PLUS CORE, BLOOD
CHROMOSOME 13: NEGATIVE
CHROMOSOME 18: NEGATIVE
CHROMOSOME 21: NEGATIVE
Y Chromosome: NOT DETECTED

## 2017-04-25 LAB — CYSTIC FIBROSIS MUTATION 97: Interpretation: NOT DETECTED

## 2017-05-16 ENCOUNTER — Ambulatory Visit (INDEPENDENT_AMBULATORY_CARE_PROVIDER_SITE_OTHER): Payer: Managed Care, Other (non HMO) | Admitting: Certified Nurse Midwife

## 2017-05-16 ENCOUNTER — Encounter: Payer: Self-pay | Admitting: Certified Nurse Midwife

## 2017-05-16 VITALS — BP 123/76 | HR 67 | Wt 156.1 lb

## 2017-05-16 DIAGNOSIS — Z3689 Encounter for other specified antenatal screening: Secondary | ICD-10-CM

## 2017-05-16 DIAGNOSIS — Z3482 Encounter for supervision of other normal pregnancy, second trimester: Secondary | ICD-10-CM

## 2017-05-16 LAB — POCT URINALYSIS DIPSTICK
Bilirubin, UA: NEGATIVE
Glucose, UA: NEGATIVE
Ketones, UA: NEGATIVE
LEUKOCYTES UA: NEGATIVE
Nitrite, UA: NEGATIVE
Protein, UA: NEGATIVE
RBC UA: NEGATIVE
SPEC GRAV UA: 1.01 (ref 1.010–1.025)
UROBILINOGEN UA: 0.2 U/dL
pH, UA: 6.5 (ref 5.0–8.0)

## 2017-05-16 NOTE — Patient Instructions (Signed)

## 2017-05-23 NOTE — Progress Notes (Signed)
ROB-Pt doing well, no questions or concerns. Discussed home treatment measures for round ligament pain. Reviewed red flag symptoms and when to call. RTC x 4-5 weeks for anatomy scan and ROB.

## 2017-05-25 ENCOUNTER — Other Ambulatory Visit: Payer: Managed Care, Other (non HMO)

## 2017-05-29 ENCOUNTER — Telehealth: Payer: Self-pay | Admitting: Certified Nurse Midwife

## 2017-05-29 NOTE — Telephone Encounter (Signed)
Called pt no answer. LM for pt informing her that this is nothing to be concerned about this could be caused by the way the baby is positioned or the uterus growing. Advised on tylenol and warm compresses. Advised on when to call back (i.e. Shortness of breath, severe pain ect.)

## 2017-05-29 NOTE — Telephone Encounter (Signed)
Patient called and lvm stating that she is experiencing slight rib pain. The patient would like to speak with a nurse to make sure that the pain that the patient is experiencing isn't something serious. No other information was disclosed. Please advise.

## 2017-06-14 ENCOUNTER — Ambulatory Visit (INDEPENDENT_AMBULATORY_CARE_PROVIDER_SITE_OTHER): Payer: Managed Care, Other (non HMO) | Admitting: Certified Nurse Midwife

## 2017-06-14 ENCOUNTER — Ambulatory Visit (INDEPENDENT_AMBULATORY_CARE_PROVIDER_SITE_OTHER): Payer: Managed Care, Other (non HMO)

## 2017-06-14 VITALS — BP 127/68 | HR 89 | Wt 156.8 lb

## 2017-06-14 DIAGNOSIS — Z3689 Encounter for other specified antenatal screening: Secondary | ICD-10-CM | POA: Diagnosis not present

## 2017-06-14 DIAGNOSIS — Z3482 Encounter for supervision of other normal pregnancy, second trimester: Secondary | ICD-10-CM

## 2017-06-14 DIAGNOSIS — N898 Other specified noninflammatory disorders of vagina: Secondary | ICD-10-CM

## 2017-06-14 DIAGNOSIS — O26892 Other specified pregnancy related conditions, second trimester: Secondary | ICD-10-CM

## 2017-06-14 LAB — POCT URINALYSIS DIPSTICK
Bilirubin, UA: NEGATIVE
Blood, UA: NEGATIVE
Glucose, UA: NEGATIVE
Ketones, UA: NEGATIVE
LEUKOCYTES UA: NEGATIVE
NITRITE UA: NEGATIVE
PH UA: 6 (ref 5.0–8.0)
PROTEIN UA: NEGATIVE
Spec Grav, UA: 1.015 (ref 1.010–1.025)
UROBILINOGEN UA: 0.2 U/dL

## 2017-06-14 NOTE — Patient Instructions (Signed)
Round Ligament Pain The round ligament is a cord of muscle and tissue that helps to support the uterus. It can become a source of pain during pregnancy if it becomes stretched or twisted as the baby grows. The pain usually begins in the second trimester of pregnancy, and it can come and go until the baby is delivered. It is not a serious problem, and it does not cause harm to the baby. Round ligament pain is usually a short, sharp, and pinching pain, but it can also be a dull, lingering, and aching pain. The pain is felt in the lower side of the abdomen or in the groin. It usually starts deep in the groin and moves up to the outside of the hip area. Pain can occur with:  A sudden change in position.  Rolling over in bed.  Coughing or sneezing.  Physical activity.  Follow these instructions at home: Watch your condition for any changes. Take these steps to help with your pain:  When the pain starts, relax. Then try: ? Sitting down. ? Flexing your knees up to your abdomen. ? Lying on your side with one pillow under your abdomen and another pillow between your legs. ? Sitting in a warm bath for 15-20 minutes or until the pain goes away.  Take over-the-counter and prescription medicines only as told by your health care provider.  Move slowly when you sit and stand.  Avoid long walks if they cause pain.  Stop or lessen your physical activities if they cause pain.  Contact a health care provider if:  Your pain does not go away with treatment.  You feel pain in your back that you did not have before.  Your medicine is not helping. Get help right away if:  You develop a fever or chills.  You develop uterine contractions.  You develop vaginal bleeding.  You develop nausea or vomiting.  You develop diarrhea.  You have pain when you urinate. This information is not intended to replace advice given to you by your health care provider. Make sure you discuss any questions you have  with your health care provider. Document Released: 06/14/2008 Document Revised: 02/11/2016 Document Reviewed: 11/12/2014 Elsevier Interactive Patient Education  2018 [employer]. How a Baby Grows During Pregnancy Pregnancy begins when a female's sperm enters a female's egg (fertilization). This happens in one of the tubes (fallopian tubes) that connect the ovaries to the womb (uterus). The fertilized egg is called an embryo until it reaches 10 weeks. From 10 weeks until birth, it is called a fetus. The fertilized egg moves down the fallopian tube to the uterus. Then it implants into the lining of the uterus and begins to grow. The developing fetus receives oxygen and nutrients through the pregnant woman's bloodstream and the tissues that grow (placenta) to support the fetus. The placenta is the life support system for the fetus. It provides nutrition and removes waste. Learning as much as you can about your pregnancy and how your baby is developing can help you enjoy the experience. It can also make you aware of when there might be a problem and when to ask questions. How long does a typical pregnancy last? A pregnancy usually lasts 280 days, or about 40 weeks. Pregnancy is divided into three trimesters:  First trimester: 0-13 weeks.  Second trimester: 14-27 weeks.  Third trimester: 28-40 weeks.  The day when your baby is considered ready to be born (full term) is your estimated date of delivery. How does  my baby develop month by month? First month  The fertilized egg attaches to the inside of the uterus.  Some cells will form the placenta. Others will form the fetus.  The arms, legs, brain, spinal cord, lungs, and heart begin to develop.  At the end of the first month, the heart begins to beat.  Second month  The bones, inner ear, eyelids, hands, and feet form.  The genitals develop.  By the end of 8 weeks, all major organs are developing.  Third month  All of the internal  organs are forming.  Teeth develop below the gums.  Bones and muscles begin to grow. The spine can flex.  The skin is transparent.  Fingernails and toenails begin to form.  Arms and legs continue to grow longer, and hands and feet develop.  The fetus is about 3 in (7.6 cm) long.  Fourth month  The placenta is completely formed.  The external sex organs, neck, outer ear, eyebrows, eyelids, and fingernails are formed.  The fetus can hear, swallow, and move its arms and legs.  The kidneys begin to produce urine.  The skin is covered with a white waxy coating (vernix) and very fine hair (lanugo).  Fifth month  The fetus moves around more and can be felt for the first time (quickening).  The fetus starts to sleep and wake up and may begin to suck its finger.  The nails grow to the end of the fingers.  The organ in the digestive system that makes bile (gallbladder) functions and helps to digest the nutrients.  If your baby is a girl, eggs are present in her ovaries. If your baby is a boy, testicles start to move down into his scrotum.  Sixth month  The lungs are formed, but the fetus is not yet able to breathe.  The eyes open. The brain continues to develop.  Your baby has fingerprints and toe prints. Your baby's hair grows thicker.  At the end of the second trimester, the fetus is about 9 in (22.9 cm) long.  Seventh month  The fetus kicks and stretches.  The eyes are developed enough to sense changes in light.  The hands can make a grasping motion.  The fetus responds to sound.  Eighth month  All organs and body systems are fully developed and functioning.  Bones harden and taste buds develop. The fetus may hiccup.  Certain areas of the brain are still developing. The skull remains soft.  Ninth month  The fetus gains about  lb (0.23 kg) each week.  The lungs are fully developed.  Patterns of sleep develop.  The fetus's head typically moves into a  head-down position (vertex) in the uterus to prepare for birth. If the buttocks move into a vertex position instead, the baby is breech.  The fetus weighs 6-9 lbs (2.72-4.08 kg) and is 19-20 in (48.26-50.8 cm) long.  What can I do to have a healthy pregnancy and help my baby develop? Eating and Drinking  Eat a healthy diet. ? Talk with your health care provider to make sure that you are getting the nutrients that you and your baby need. ? Visit www.BuildDNA.es to learn about creating a healthy diet.  Gain a healthy amount of weight during pregnancy as advised by your health care provider. This is usually 25-35 pounds. You may need to: ? Gain more if you were underweight before getting pregnant or if you are pregnant with more than one baby. ? Gain less  if you were overweight or obese when you got pregnant.  Medicines and Vitamins  Take prenatal vitamins as directed by your health care provider. These include vitamins such as folic acid, iron, calcium, and vitamin D. They are important for healthy development.  Take medicines only as directed by your health care provider. Read labels and ask a pharmacist or your health care provider whether over-the-counter medicines, supplements, and prescription drugs are safe to take during pregnancy.  Activities  Be physically active as advised by your health care provider. Ask your health care provider to recommend activities that are safe for you to do, such as walking or swimming.  Do not participate in strenuous or extreme sports.  Lifestyle  Do not drink alcohol.  Do not use any tobacco products, including cigarettes, chewing tobacco, or electronic cigarettes. If you need help quitting, ask your health care provider.  Do not use illegal drugs.  Safety  Avoid exposure to mercury, lead, or other heavy metals. Ask your health care provider about common sources of these heavy metals.  Avoid listeria infection during pregnancy. Follow  these precautions: ? Do not eat soft cheeses or deli meats. ? Do not eat hot dogs unless they have been warmed up to the point of steaming, such as in the microwave oven. ? Do not drink unpasteurized milk.  Avoid toxoplasmosis infection during pregnancy. Follow these precautions: ? Do not change your cat's litter box, if you have a cat. Ask someone else to do this for you. ? Wear gardening gloves while working in the yard.  General Instructions  Keep all follow-up visits as directed by your health care provider. This is important. This includes prenatal care and screening tests.  Manage any chronic health conditions. Work closely with your health care provider to keep conditions, such as diabetes, under control.  How do I know if my baby is developing well? At each prenatal visit, your health care provider will do several different tests to check on your health and keep track of your baby's development. These include:  Fundal height. ? Your health care provider will measure your growing belly from top to bottom using a tape measure. ? Your health care provider will also feel your belly to determine your baby's position.  Heartbeat. ? An ultrasound in the first trimester can confirm pregnancy and show a heartbeat, depending on how far along you are. ? Your health care provider will check your baby's heart rate at every prenatal visit. ? As you get closer to your delivery date, you may have regular fetal heart rate monitoring to make sure that your baby is not in distress.  Second trimester ultrasound. ? This ultrasound checks your baby's development. It also indicates your baby's gender.  What should I do if I have concerns about my baby's development? Always talk with your health care provider about any concerns that you may have. This information is not intended to replace advice given to you by your health care provider. Make sure you discuss any questions you have with your health  care provider. Document Released: 02/22/2008 Document Revised: 02/11/2016 Document Reviewed: 02/12/2014 Elsevier Interactive Patient Education  [employer].

## 2017-06-14 NOTE — Progress Notes (Signed)
Cynthia Harrell, doing well anatomy scan today, incomplete. ( see below for report) return 1-2 wks for completion. She complains of increased discharge with an odor. She is concerned that she may have an std and would like repeat testing. Nuswab completed today. Will follow up with results.   Cynthia Harrell, CNM  ULTRASOUND REPORT  Location: [facility] Date of Service: 06/14/17  Indications: Anatomy Findings:  Singleton intrauterine pregnancy is visualized with FHR at 157 BPM. Biometrics give an (U/S) Gestational age of [redacted] weeks 3 days, and an (U/S) EDD of 10/29/17; this correlates with the clinically established EDD of 11/02/16.  Fetal presentation is vertex, spine posterior.  EFW: 347 grams ( 0 lbs. 12 oz). Placenta: Posterior, grade 1 and remote to cervix at 5.2 cm with a central cord insert. AFI: Subjectively adequate with an MVP of 4.4 cm.  Anatomic survey is incomplete due to fetal position. Anatomy seen today appears WNL. Anatomy needed to complete exam: Spine. Gender - Female.   Right Ovary measures 3.2 x 1.8 x 2.5 cm, and appears WNL. Left Ovary measures 2.9 x 1.8 x 2.2 cm, and appears WNL. There is no obvious evidence of a corpus luteal cyst. Survey of the adnexa demonstrates no adnexal masses. There is no free peritoneal fluid in the cul de sac.  Impression: 1. 20 week 3 day Viable Singleton Intrauterine pregnancy by U/S. 2. (U/S) EDD is consistent with Clinically established (LMP) EDD of 11/02/16. 3. Incomplete anatomy scan due to fetal position. Anatomy seen today appears WNL. Anatomy needed to complete exam: Spine.  Recommendations: 1.Clinical correlation with the patient's History and Physical Exam. 2. Patient will return in 1-2 weeks to complete anatomy scan.  Macarthur Critchley, RDMS, RVT

## 2017-06-17 LAB — NUSWAB VAGINITIS PLUS (VG+)
CANDIDA GLABRATA, NAA: NEGATIVE
CHLAMYDIA TRACHOMATIS, NAA: NEGATIVE
Candida albicans, NAA: NEGATIVE
NEISSERIA GONORRHOEAE, NAA: NEGATIVE
TRICH VAG BY NAA: NEGATIVE

## 2017-06-19 ENCOUNTER — Encounter: Payer: Self-pay | Admitting: Certified Nurse Midwife

## 2017-06-28 ENCOUNTER — Ambulatory Visit (INDEPENDENT_AMBULATORY_CARE_PROVIDER_SITE_OTHER): Payer: Managed Care, Other (non HMO)

## 2017-06-28 DIAGNOSIS — Z3482 Encounter for supervision of other normal pregnancy, second trimester: Secondary | ICD-10-CM

## 2017-07-12 ENCOUNTER — Ambulatory Visit (INDEPENDENT_AMBULATORY_CARE_PROVIDER_SITE_OTHER): Payer: Managed Care, Other (non HMO) | Admitting: Obstetrics and Gynecology

## 2017-07-12 VITALS — BP 106/64 | HR 94 | Wt 162.2 lb

## 2017-07-12 DIAGNOSIS — Z23 Encounter for immunization: Secondary | ICD-10-CM | POA: Diagnosis not present

## 2017-07-12 DIAGNOSIS — Z3492 Encounter for supervision of normal pregnancy, unspecified, second trimester: Secondary | ICD-10-CM

## 2017-07-12 LAB — POCT URINALYSIS DIPSTICK
Bilirubin, UA: NEGATIVE
Blood, UA: NEGATIVE
Glucose, UA: NEGATIVE
Ketones, UA: 15
Nitrite, UA: NEGATIVE
Spec Grav, UA: 1.01 (ref 1.010–1.025)
Urobilinogen, UA: 0.2 E.U./dL
pH, UA: 7 (ref 5.0–8.0)

## 2017-07-12 NOTE — Progress Notes (Signed)
ROB-reports increased pain under bra on right breast- wears it  Unstrapped. Flu vaccine given today. Recommend bra without underwire.

## 2017-07-12 NOTE — Progress Notes (Signed)
ROB- pt is doing well 

## 2017-08-04 ENCOUNTER — Other Ambulatory Visit: Payer: Managed Care, Other (non HMO)

## 2017-08-04 ENCOUNTER — Ambulatory Visit (INDEPENDENT_AMBULATORY_CARE_PROVIDER_SITE_OTHER): Payer: Managed Care, Other (non HMO) | Admitting: Certified Nurse Midwife

## 2017-08-04 VITALS — BP 116/74 | HR 93 | Wt 165.3 lb

## 2017-08-04 DIAGNOSIS — Z23 Encounter for immunization: Secondary | ICD-10-CM

## 2017-08-04 DIAGNOSIS — Z130 Encounter for screening for diseases of the blood and blood-forming organs and certain disorders involving the immune mechanism: Secondary | ICD-10-CM

## 2017-08-04 DIAGNOSIS — Z131 Encounter for screening for diabetes mellitus: Secondary | ICD-10-CM

## 2017-08-04 DIAGNOSIS — Z3492 Encounter for supervision of normal pregnancy, unspecified, second trimester: Secondary | ICD-10-CM

## 2017-08-04 LAB — POCT URINALYSIS DIPSTICK
Bilirubin, UA: NEGATIVE
Ketones, UA: NEGATIVE
Leukocytes, UA: NEGATIVE
NITRITE UA: NEGATIVE
PH UA: 7.5 (ref 5.0–8.0)
PROTEIN UA: NEGATIVE
RBC UA: NEGATIVE
Spec Grav, UA: 1.02 (ref 1.010–1.025)
UROBILINOGEN UA: 0.2 U/dL

## 2017-08-04 NOTE — Patient Instructions (Addendum)
[place] 2018 Prenatal Education Class Schedule Register at [email or web address] in the [place] or call Kerman Passey at [phone] 9:00a-5:00p M-F  Childbirth Preparation Certified Childbirth Educators teach this 5 week course.  Expectant parents are encouraged to take this class in their 3rd trimester, completing it by their 35-36th week. Meets in [facility], Lower Level.  Mondays Thursdays  7:00-9:00 p 7:00-9:00 p  July 23 - August 20 July 19 - August 16  September 17 - October 15 September 6 -October 4  November 5 - December 3 October 25 - November 29   No Class on Thanksgiving Day -November 22  Childbirth Preparation Refresher Course For those who have previously attended Prepared Childbirth Preparation classes, this class in incorporated into the 3rd and 4th classes in the Monday night childbirth series.  Course meets in the [facility]. Lower Level from 7:00p - 9:00p  August 6 & 13  October 1 & 8  November 19 & 26   Weekend Childbirth Waymon Amato Classes are held Saturday & Sunday, 1:00 5:00p Course meets in [facility], [place] Level  August 4 & 5  November 3 & 4    The Birth[facility] Tours Free tours are held on the third Sunday of each month at 3 pm.  The tour meets in the third floor waiting area and will take approximately 30 minutes.  Tours are also included in Childbirth class series as well as Brother/Sister class.  An online virtual tour can be seen at [email or web address].         Breastfeeding & Infant Nutrition The course incorporates returning to work or school.  Breast milk collection and storage with basic breastfeeding and infant nutrition. This two-class course is held the 2nd and 3rd Tuesday of each month from 7:00 -9:00 pm.  Course meets in the [facility] Lower level  June 12 & 19 July-No Class  August 14 & 21 September 11 &18  September 11 & 18 October 9 &16  November 13 &  20 December 11 & 18   [employer] welcomes any mother for a social outing with other Moms to share experiences and challenges in an informal setting.  Meets the 1st Thursday and 3rd Thursday 11:30a-1:00 pm of each month in [facility] 3rd floor classroom.  No registration required.  Newborn Essentials This course covers bathing, diapering, swaddling and more with practice on lifelike dolls.  Participants will also learn safety tips and infant CPR (Not for certification).  It is held the 1st Wednesday of each month from 7:00p-9:00p in the [facility], Lower level.  June 6 July- No Class  August 1 September 5  October 3 November 7  December 7    Preparing Big Brother & Sister This one session course prepares children and their parents for the arrival of a new baby.  It is held on the 1st Tuesday of each month from 6:30p - 8:00p. Course meets in the [facility], Lower level.  July-No Class August 7  September 4 October 2  November 6 December 4   [place] for [employer] Dads This nationally acclaimed class helps expecting and new dads with the basic skills and confidence to bond with their infants, support their mates, and provide a safe and healthy home environment for their new family. Classes are held the 2nd Saturday of every month from 9:00a - 12:00 noon.  Course meets in the [facility] Lower level.  June 9 August 11  October 13 No Class in December  Pain Relief During Labor and Delivery Many things can cause pain during labor and delivery, including:  Pressure on bones and ligaments due to the baby moving through the pelvis.  Stretching of tissues due to the baby moving through the birth canal.  Muscle tension due to anxiety or nervousness.  The uterus tightening (contracting) and relaxing to help move the baby.  There are many ways to deal with the pain of labor and delivery. They include:  Taking prenatal classes. Taking these classes helps you  know what to expect during your baby's birth. What you learn will increase your confidence and decrease your anxiety.  Practicing relaxation techniques or doing relaxing activities, such as: ? Focused breathing. ? Meditation. ? Visualization. ? Aroma therapy. ? Listening to your favorite music. ? Hypnosis.  Taking a warm shower or bath (hydrotherapy). This may: ? Provide comfort and relaxation. ? Lessen your perception of pain. ? Decrease the amount of pain medicine needed. ? Decrease the length of labor.  Getting a massage or counterpressure on your back.  Applying warm packs or ice packs.  Changing positions often, moving around, or using a birthing ball.  Getting: ? Pain medicine through an IV or injection into a muscle. ? Pain medicine inserted into your spinal column. ? Injections of sterile water just under the skin on your lower back (intradermal injections). ? Laughing gas (nitrous oxide).  Discuss your pain control options with your health care provider during your prenatal visits. Explore the options offered by your hospital or birth center. What kinds of medicine are available? There are two kinds of medicines that can be used to relieve pain during labor and delivery:  Analgesics. These medicines decrease pain without causing you to lose feeling or the ability to move your muscles.  Anesthetics. These medicines block feeling in the body and can decrease your ability to move freely.  Both of these kinds of medicine can cause minor side effects, such as nausea, trouble concentrating, and sleepiness. They can also decrease the baby's heart rate before birth and affect the baby's breathing rate after birth. For this reason, health care providers are careful about when and how much medicine is given. What are specific medicines and procedures that provide pain relief? Local Anesthetics Local anesthetics are used to numb a small area of the body. They may be used along  with another kind of anesthetic or used to numb the nerves of the vagina, cervix, and perineum during the second stage of labor. General Anesthetics General anesthetics cause you to lose consciousness so you do not feel pain. They are usually only used for an emergency cesarean delivery. General anesthetics are given through an IV tube and a mask. Pudendal Block A pudendal block is a form of local anesthetic. It may be used to relieve the pain associated with pushing or stretching of the perineum at the time of delivery or to further numb the perineum. A pudendal block is done by injecting numbing medicine through the vaginal wall into a nerve in the pelvis. Epidural Analgesia Epidural analgesia is given through a flexible IV catheter that is inserted into the lower back. Numbing medicine is delivered continuously to the area near your spinal column nerves (epidural space). After having this type of analgesia, you may be able to move your legs but you most likely will not be able to walk. Depending on the amount of medicine given, you may  lose all feeling in the lower half of your body, or you may retain some level of sensation, including the urge to push. Epidural analgesia can be used to provide pain relief for a vaginal birth. Spinal Block A spinal block is similar to epidural analgesia, but the medicine is injected into the spinal fluid instead of the epidural space. A spinal block is only given once. It starts to relieve pain quickly, but the pain relief lasts only 1-6 hours. Spinal blocks can be used for cesarean deliveries. Combined Spinal-Epidural (CSE) Block A CSE block combines the effects of a spinal block and epidural analgesia. The spinal block works quickly to block all pain. The epidural analgesia provides continuous pain relief, even after the effects of the spinal block have worn off. This information is not intended to replace advice given to you by your health care provider. Make sure  you discuss any questions you have with your health care provider. Document Released: 12/22/2008 Document Revised: 02/12/2016 Document Reviewed: 01/27/2016 Elsevier Interactive Patient Education  2018 [employer]. Vaginal Delivery Vaginal delivery means that you will give birth by pushing your baby out of your birth canal (vagina). A team of health care providers will help you before, during, and after vaginal delivery. Birth experiences are unique for every woman and every pregnancy, and birth experiences vary depending on where you choose to give birth. What should I do to prepare for my baby's birth? Before your baby is born, it is important to talk with your health care provider about:  Your labor and delivery preferences. These may include: ? Medicines that you may be given. ? How you will manage your pain. This might include non-medical pain relief techniques or injectable pain relief such as epidural analgesia. ? How you and your baby will be monitored during labor and delivery. ? Who may be in the labor and delivery room with you. ? Your feelings about surgical delivery of your baby (cesarean delivery, or C-section) if this becomes necessary. ? Your feelings about receiving donated blood through an IV tube (blood transfusion) if this becomes necessary.  Whether you are able: ? To take pictures or videos of the birth. ? To eat during labor and delivery. ? To move around, walk, or change positions during labor and delivery.  What to expect after your baby is born, such as: ? Whether delayed umbilical cord clamping and cutting is offered. ? Who will care for your baby right after birth. ? Medicines or tests that may be recommended for your baby. ? Whether breastfeeding is supported in your hospital or birth center. ? How long you will be in the hospital or birth center.  How any medical conditions you have may affect your baby or your labor and delivery experience.  To prepare  for your baby's birth, you should also:  Attend all of your health care visits before delivery (prenatal visits) as recommended by your health care provider. This is important.  Prepare your home for your baby's arrival. Make sure that you have: ? Diapers. ? Baby clothing. ? Feeding equipment. ? Safe sleeping arrangements for you and your baby.  Install a car seat in your vehicle. Have your car seat checked by a certified car seat installer to make sure that it is installed safely.  Think about who will help you with your new baby at home for at least the first several weeks after delivery.  What can I expect when I arrive at the birth center or  hospital? Once you are in labor and have been admitted into the hospital or birth center, your health care provider may:  Review your pregnancy history and any concerns you have.  Insert an IV tube into one of your veins. This is used to give you fluids and medicines.  Check your blood pressure, pulse, temperature, and heart rate (vital signs).  Check whether your bag of water (amniotic sac) has broken (ruptured).  Talk with you about your birth plan and discuss pain control options.  Monitoring Your health care provider may monitor your contractions (uterine monitoring) and your baby's heart rate (fetal monitoring). You may need to be monitored:  Often, but not continuously (intermittently).  All the time or for long periods at a time (continuously). Continuous monitoring may be needed if: ? You are taking certain medicines, such as medicine to relieve pain or make your contractions stronger. ? You have pregnancy or labor complications.  Monitoring may be done by:  Placing a special stethoscope or a handheld monitoring device on your abdomen to check your baby's heartbeat, and feeling your abdomen for contractions. This method of monitoring does not continuously record your baby's heartbeat or your contractions.  Placing monitors on  your abdomen (external monitors) to record your baby's heartbeat and the frequency and length of contractions. You may not have to wear external monitors all the time.  Placing monitors inside of your uterus (internal monitors) to record your baby's heartbeat and the frequency, length, and strength of your contractions. ? Your health care provider may use internal monitors if he or she needs more information about the strength of your contractions or your baby's heart rate. ? Internal monitors are put in place by passing a thin, flexible wire through your vagina and into your uterus. Depending on the type of monitor, it may remain in your uterus or on your baby's head until birth. ? Your health care provider will discuss the benefits and risks of internal monitoring with you and will ask for your permission before inserting the monitors.  Telemetry. This is a type of continuous monitoring that can be done with external or internal monitors. Instead of having to stay in bed, you are able to move around during telemetry. Ask your health care provider if telemetry is an option for you.  Physical exam Your health care provider may perform a physical exam. This may include:  Checking whether your baby is positioned: ? With the head toward your vagina (head-down). This is most common. ? With the head toward the top of your uterus (head-up or breech). If your baby is in a breech position, your health care provider may try to turn your baby to a head-down position so you can deliver vaginally. If it does not seem that your baby can be born vaginally, your provider may recommend surgery to deliver your baby. In rare cases, you may be able to deliver vaginally if your baby is head-up (breech delivery). ? Lying sideways (transverse). Babies that are lying sideways cannot be delivered vaginally.  Checking your cervix to determine: ? Whether it is thinning out (effacing). ? Whether it is opening up  (dilating). ? How low your baby has moved into your birth canal.  What are the three stages of labor and delivery?  Normal labor and delivery is divided into the following three stages: Stage 1  Stage 1 is the longest stage of labor, and it can last for hours or days. Stage 1 includes: ? Early labor.  This is when contractions may be irregular, or regular and mild. Generally, early labor contractions are more than 10 minutes apart. ? Active labor. This is when contractions get longer, more regular, more frequent, and more intense. ? The transition phase. This is when contractions happen very close together, are very intense, and may last longer than during any other part of labor.  Contractions generally feel mild, infrequent, and irregular at first. They get stronger, more frequent (about every 2-3 minutes), and more regular as you progress from early labor through active labor and transition.  Many women progress through stage 1 naturally, but you may need help to continue making progress. If this happens, your health care provider may talk with you about: ? Rupturing your amniotic sac if it has not ruptured yet. ? Giving you medicine to help make your contractions stronger and more frequent.  Stage 1 ends when your cervix is completely dilated to 4 inches (10 cm) and completely effaced. This happens at the end of the transition phase. Stage 2  Once your cervix is completely effaced and dilated to 4 inches (10 cm), you may start to feel an urge to push. It is common for the body to naturally take a rest before feeling the urge to push, especially if you received an epidural or certain other pain medicines. This rest period may last for up to 1-2 hours, depending on your unique labor experience.  During stage 2, contractions are generally less painful, because pushing helps relieve contraction pain. Instead of contraction pain, you may feel stretching and burning pain, especially when the  widest part of your baby's head passes through the vaginal opening (crowning).  Your health care provider will closely monitor your pushing progress and your baby's progress through the vagina during stage 2.  Your health care provider may massage the area of skin between your vaginal opening and anus (perineum) or apply warm compresses to your perineum. This helps it stretch as the baby's head starts to crown, which can help prevent perineal tearing. ? In some cases, an incision may be made in your perineum (episiotomy) to allow the baby to pass through the vaginal opening. An episiotomy helps to make the opening of the vagina larger to allow more room for the baby to fit through.  It is very important to breathe and focus so your health care provider can control the delivery of your baby's head. Your health care provider may have you decrease the intensity of your pushing, to help prevent perineal tearing.  After delivery of your baby's head, the shoulders and the rest of the body generally deliver very quickly and without difficulty.  Once your baby is delivered, the umbilical cord may be cut right away, or this may be delayed for 1-2 minutes, depending on your baby's health. This may vary among health care providers, hospitals, and birth centers.  If you and your baby are healthy enough, your baby may be placed on your chest or abdomen to help maintain the baby's temperature and to help you bond with each other. Some mothers and babies start breastfeeding at this time. Your health care team will dry your baby and help keep your baby warm during this time.  Your baby may need immediate care if he or she: ? Showed signs of distress during labor. ? Has a medical condition. ? Was born too early (prematurely). ? Had a bowel movement before birth (meconium). ? Shows signs of difficulty transitioning from being inside the  uterus to being outside of the uterus. If you are planning to breastfeed,  your health care team will help you begin a feeding. Stage 3  The third stage of labor starts immediately after the birth of your baby and ends after you deliver the placenta. The placenta is an organ that develops during pregnancy to provide oxygen and nutrients to your baby in the womb.  Delivering the placenta may require some pushing, and you may have mild contractions. Breastfeeding can stimulate contractions to help you deliver the placenta.  After the placenta is delivered, your uterus should tighten (contract) and become firm. This helps to stop bleeding in your uterus. To help your uterus contract and to control bleeding, your health care provider may: ? Give you medicine by injection, through an IV tube, by mouth, or through your rectum (rectally). ? Massage your abdomen or perform a vaginal exam to remove any blood clots that are left in your uterus. ? Empty your bladder by placing a thin, flexible tube (catheter) into your bladder. ? Encourage you to breastfeed your baby. After labor is over, you and your baby will be monitored closely to ensure that you are both healthy until you are ready to go home. Your health care team will teach you how to care for yourself and your baby. This information is not intended to replace advice given to you by your health care provider. Make sure you discuss any questions you have with your health care provider. Document Released: 06/14/2008 Document Revised: 03/25/2016 Document Reviewed: 09/20/2015 Elsevier Interactive Patient Education  2018 [employer]. Cord Blood Banking Information Cord blood banking is the process of collecting and storing the blood that is in the umbilical cord and placenta at the time of delivery. This blood contains stem cells, which can be used to treat many blood diseases, immune system disorders, and childhood cancers. Stem cells can also be used to research certain diseases and treatments. Many people who choose cord  blood banking donate the blood. Donated blood can be used in lifesaving treatments or for research. Other people choose to store the blood privately. Blood that is stored privately can only be used with the person's permission. This option is often chosen if:  A family member needs a stem cell transplant.  The child is part of an ethnic minority.  The child was conceived through in vitro fertilization.  What should I look for in a blood bank? A blood bank is the organization that coordinates cord blood banking. Make sure the cord blood bank that you use:  Is accredited.  Is financially stable.  Handles a large volume of cord blood samples.  Has a procedure in place for transport and storage.  Allows you the option of transferring your cord blood sample.  Has a procedure in place if the bank goes out of business.  Clearly states all costs and limits to future costs.  People who choose to donate cord blood should not need to pay for blood banking. People who keep the blood for private use will need to pay for the first (initial) storage and pay a fee each year (annual fee). Other fees may also apply. What are the risks of cord blood banking? There are no health risks associated with cord blood banking. It is considered safe. How should I prepare? You must schedule this process at least 4-6 weeks before you will be giving birth. How is the blood collected? The blood is collected as soon as the baby  has been delivered. Within 15 minutes of delivery, a health care provider will take these actions to collect the blood:  Clamp the umbilical cord at the top and bottom. This traps the blood in the umbilical cord.  Use a syringe or bag to collect the blood.  Insert needles into the placenta to collect (draw out) more blood.  What happens after the blood is collected? After the blood has been collected:  The blood will be sent to a blood bank.  The blood will be tested for genetic  problems and infectious diseases. If the blood tests positive for a genetic problem or a disease, someone will contact you and let you know.  The blood will be frozen.  If your child develops a genetic condition, immune system disorder, or cancer, you will be responsible for contacting the blood bank and letting them know. This information is not intended to replace advice given to you by your health care provider. Make sure you discuss any questions you have with your health care provider. Document Released: 02/23/2010 Document Revised: 02/11/2016 Document Reviewed: 02/23/2015 Elsevier Interactive Patient Education  2018 [employer]. Breastfeeding Deciding to breastfeed is one of the best choices you can make for you and your baby. A change in hormones during pregnancy causes your breast tissue to grow and increases the number and size of your milk ducts. These hormones also allow proteins, sugars, and fats from your blood supply to make breast milk in your milk-producing glands. Hormones prevent breast milk from being released before your baby is born as well as prompt milk flow after birth. Once breastfeeding has begun, thoughts of your baby, as well as his or her sucking or crying, can stimulate the release of milk from your milk-producing glands. Benefits of breastfeeding For Your Baby  Your first milk (colostrum) helps your baby's digestive system function better.  There are antibodies in your milk that help your baby fight off infections.  Your baby has a lower incidence of asthma, allergies, and sudden infant death syndrome.  The nutrients in breast milk are better for your baby than infant formulas and are designed uniquely for your baby's needs.  Breast milk improves your baby's brain development.  Your baby is less likely to develop other conditions, such as childhood obesity, asthma, or type 2 diabetes mellitus.  For You  Breastfeeding helps to create a very special bond  between you and your baby.  Breastfeeding is convenient. Breast milk is always available at the correct temperature and costs nothing.  Breastfeeding helps to burn calories and helps you lose the weight gained during pregnancy.  Breastfeeding makes your uterus contract to its prepregnancy size faster and slows bleeding (lochia) after you give birth.  Breastfeeding helps to lower your risk of developing type 2 diabetes mellitus, osteoporosis, and breast or ovarian cancer later in life.  Signs that your baby is hungry Early Signs of Hunger  Increased alertness or activity.  Stretching.  Movement of the head from side to side.  Movement of the head and opening of the mouth when the corner of the mouth or cheek is stroked (rooting).  Increased sucking sounds, smacking lips, cooing, sighing, or squeaking.  Hand-to-mouth movements.  Increased sucking of fingers or hands.  Late Signs of Hunger  Fussing.  Intermittent crying.  Extreme Signs of Hunger Signs of extreme hunger will require calming and consoling before your baby will be able to breastfeed successfully. Do not wait for the following signs of extreme hunger  to occur before you initiate breastfeeding:  Restlessness.  A loud, strong cry.  Screaming.  Breastfeeding basics Breastfeeding Initiation  Find a comfortable place to sit or lie down, with your neck and back well supported.  Place a pillow or rolled up blanket under your baby to bring him or her to the level of your breast (if you are seated). Nursing pillows are specially designed to help support your arms and your baby while you breastfeed.  Make sure that your baby's abdomen is facing your abdomen.  Gently massage your breast. With your fingertips, massage from your chest wall toward your nipple in a circular motion. This encourages milk flow. You may need to continue this action during the feeding if your milk flows slowly.  Support your breast with 4  fingers underneath and your thumb above your nipple. Make sure your fingers are well away from your nipple and your baby's mouth.  Stroke your baby's lips gently with your finger or nipple.  When your baby's mouth is open wide enough, quickly bring your baby to your breast, placing your entire nipple and as much of the colored area around your nipple (areola) as possible into your baby's mouth. ? More areola should be visible above your baby's upper lip than below the lower lip. ? Your baby's tongue should be between his or her lower gum and your breast.  Ensure that your baby's mouth is correctly positioned around your nipple (latched). Your baby's lips should create a seal on your breast and be turned out (everted).  It is common for your baby to suck about 2-3 minutes in order to start the flow of breast milk.  Latching Teaching your baby how to latch on to your breast properly is very important. An improper latch can cause nipple pain and decreased milk supply for you and poor weight gain in your baby. Also, if your baby is not latched onto your nipple properly, he or she may swallow some air during feeding. This can make your baby fussy. Burping your baby when you switch breasts during the feeding can help to get rid of the air. However, teaching your baby to latch on properly is still the best way to prevent fussiness from swallowing air while breastfeeding. Signs that your baby has successfully latched on to your nipple:  Silent tugging or silent sucking, without causing you pain.  Swallowing heard between every 3-4 sucks.  Muscle movement above and in front of his or her ears while sucking.  Signs that your baby has not successfully latched on to nipple:  Sucking sounds or smacking sounds from your baby while breastfeeding.  Nipple pain.  If you think your baby has not latched on correctly, slip your finger into the corner of your baby's mouth to break the suction and place it  between your baby's gums. Attempt breastfeeding initiation again. Signs of Successful Breastfeeding Signs from your baby:  A gradual decrease in the number of sucks or complete cessation of sucking.  Falling asleep.  Relaxation of his or her body.  Retention of a small amount of milk in his or her mouth.  Letting go of your breast by himself or herself.  Signs from you:  Breasts that have increased in firmness, weight, and size 1-3 hours after feeding.  Breasts that are softer immediately after breastfeeding.  Increased milk volume, as well as a change in milk consistency and color by the fifth day of breastfeeding.  Nipples that are not sore, cracked,  or bleeding.  Signs That Your [employer] is Getting Enough Milk  Wetting at least 1-2 diapers during the first 24 hours after birth.  Wetting at least 5-6 diapers every 24 hours for the first week after birth. The urine should be clear or pale yellow by 5 days after birth.  Wetting 6-8 diapers every 24 hours as your baby continues to grow and develop.  At least 3 stools in a 24-hour period by age 39 days. The stool should be soft and yellow.  At least 3 stools in a 24-hour period by age 609 days. The stool should be seedy and yellow.  No loss of weight greater than 10% of birth weight during the first 54 days of age.  Average weight gain of 4-7 ounces (113-198 g) per week after age 60 days.  Consistent daily weight gain by age 29 days, without weight loss after the age of 2 weeks.  After a feeding, your baby may spit up a small amount. This is common. Breastfeeding frequency and duration Frequent feeding will help you make more milk and can prevent sore nipples and breast engorgement. Breastfeed when you feel the need to reduce the fullness of your breasts or when your baby shows signs of hunger. This is called "breastfeeding on demand." Avoid introducing a pacifier to your baby while you are working to establish breastfeeding (the  first 4-6 weeks after your baby is born). After this time you may choose to use a pacifier. Research has shown that pacifier use during the first year of a baby's life decreases the risk of sudden infant death syndrome (SIDS). Allow your baby to feed on each breast as long as he or she wants. Breastfeed until your baby is finished feeding. When your baby unlatches or falls asleep while feeding from the first breast, offer the second breast. Because newborns are often sleepy in the first few weeks of life, you may need to awaken your baby to get him or her to feed. Breastfeeding times will vary from baby to baby. However, the following rules can serve as a guide to help you ensure that your baby is properly fed:  Newborns (babies 81 weeks of age or younger) may breastfeed every 1-3 hours.  Newborns should not go longer than 3 hours during the day or 5 hours during the night without breastfeeding.  You should breastfeed your baby a minimum of 8 times in a 24-hour period until you begin to introduce solid foods to your baby at around 66 months of age.  Breast milk pumping Pumping and storing breast milk allows you to ensure that your baby is exclusively fed your breast milk, even at times when you are unable to breastfeed. This is especially important if you are going back to work while you are still breastfeeding or when you are not able to be present during feedings. Your lactation consultant can give you guidelines on how long it is safe to store breast milk. A breast pump is a machine that allows you to pump milk from your breast into a sterile bottle. The pumped breast milk can then be stored in a refrigerator or freezer. Some breast pumps are operated by hand, while others use electricity. Ask your lactation consultant which type will work best for you. Breast pumps can be purchased, but some hospitals and breastfeeding support groups lease breast pumps on a monthly basis. A lactation consultant can  teach you how to hand express breast milk, if you prefer not to  use a pump. Caring for your breasts while you breastfeed Nipples can become dry, cracked, and sore while breastfeeding. The following recommendations can help keep your breasts moisturized and healthy:  Avoid using soap on your nipples.  Wear a supportive bra. Although not required, special nursing bras and tank tops are designed to allow access to your breasts for breastfeeding without taking off your entire bra or top. Avoid wearing underwire-style bras or extremely tight bras.  Air dry your nipples for 3-94mnutes after each feeding.  Use only cotton bra pads to absorb leaked breast milk. Leaking of breast milk between feedings is normal.  Use lanolin on your nipples after breastfeeding. Lanolin helps to maintain your skin's normal moisture barrier. If you use pure lanolin, you do not need to wash it off before feeding your baby again. Pure lanolin is not toxic to your baby. You may also hand express a few drops of breast milk and gently massage that milk into your nipples and allow the milk to air dry.  In the first few weeks after giving birth, some women experience extremely full breasts (engorgement). Engorgement can make your breasts feel heavy, warm, and tender to the touch. Engorgement peaks within 3-5 days after you give birth. The following recommendations can help ease engorgement:  Completely empty your breasts while breastfeeding or pumping. You may want to start by applying warm, moist heat (in the shower or with warm water-soaked hand towels) just before feeding or pumping. This increases circulation and helps the milk flow. If your baby does not completely empty your breasts while breastfeeding, pump any extra milk after he or she is finished.  Wear a snug bra (nursing or regular) or tank top for 1-2 days to signal your body to slightly decrease milk production.  Apply ice packs to your breasts, unless this is too  uncomfortable for you.  Make sure that your baby is latched on and positioned properly while breastfeeding.  If engorgement persists after 48 hours of following these recommendations, contact your health care provider or a lScience writer Overall health care recommendations while breastfeeding  Eat healthy foods. Alternate between meals and snacks, eating 3 of each per day. Because what you eat affects your breast milk, some of the foods may make your baby more irritable than usual. Avoid eating these foods if you are sure that they are negatively affecting your baby.  Drink milk, fruit juice, and water to satisfy your thirst (about 10 glasses a day).  Rest often, relax, and continue to take your prenatal vitamins to prevent fatigue, stress, and anemia.  Continue breast self-awareness checks.  Avoid chewing and smoking tobacco. Chemicals from cigarettes that pass into breast milk and exposure to secondhand smoke may harm your baby.  Avoid alcohol and drug use, including marijuana. Some medicines that may be harmful to your baby can pass through breast milk. It is important to ask your health care provider before taking any medicine, including all over-the-counter and prescription medicine as well as vitamin and herbal supplements. It is possible to become pregnant while breastfeeding. If birth control is desired, ask your health care provider about options that will be safe for your baby. Contact a health care provider if:  You feel like you want to stop breastfeeding or have become frustrated with breastfeeding.  You have painful breasts or nipples.  Your nipples are cracked or bleeding.  Your breasts are red, tender, or warm.  You have a swollen area on either breast.  You  have a fever or chills.  You have nausea or vomiting.  You have drainage other than breast milk from your nipples.  Your breasts do not become full before feedings by the fifth day after you give  birth.  You feel sad and depressed.  Your baby is too sleepy to eat well.  Your baby is having trouble sleeping.  Your baby is wetting less than 3 diapers in a 24-hour period.  Your baby has less than 3 stools in a 24-hour period.  Your baby's skin or the white part of his or her eyes becomes yellow.  Your baby is not gaining weight by 62 days of age. Get help right away if:  Your baby is overly tired (lethargic) and does not want to wake up and feed.  Your baby develops an unexplained fever. This information is not intended to replace advice given to you by your health care provider. Make sure you discuss any questions you have with your health care provider. Document Released: 09/05/2005 Document Revised: 02/17/2016 Document Reviewed: 02/27/2013 Elsevier Interactive Patient Education  2017 [place]. Common Medications Safe in Pregnancy  Acne:      Constipation:  Benzoyl Peroxide     Colace  Clindamycin      Dulcolax Suppository  Topica Erythromycin     Fibercon  Salicylic Acid      Metamucil         Miralax AVOID:        Senakot   Accutane    Cough:  Retin-A       Cough Drops  Tetracycline      Phenergan w/ Codeine if Rx  Minocycline      Robitussin (Plain & DM)  Antibiotics:     Crabs/Lice:  Ceclor       RID  Cephalosporins    AVOID:  E-Mycins      Kwell  Keflex  Macrobid/Macrodantin   Diarrhea:  Penicillin      Kao-Pectate  Zithromax      Imodium AD         PUSH FLUIDS AVOID:       Cipro     Fever:  Tetracycline      Tylenol (Regular or Extra  Minocycline       Strength)  Levaquin      Extra Strength-Do not          Exceed 8 tabs/24 hrs Caffeine:        <231m/day (equiv. To 1 cup of coffee or  approx. 3 12 oz sodas)         Gas: Cold/Hayfever:       Gas-X  Benadryl      Mylicon  Claritin       Phazyme  **Claritin-D        Chlor-Trimeton    Headaches:  Dimetapp      ASA-Free Excedrin  Drixoral-Non-Drowsy     Cold Compress  Mucinex  (Guaifenasin)     Tylenol (Regular or Extra  Sudafed/Sudafed-12 Hour     Strength)  **Sudafed PE Pseudoephedrine   Tylenol Cold & Sinus     Vicks Vapor Rub  Zyrtec  **AVOID if Problems With Blood Pressure         Heartburn: Avoid lying down for at least 1 hour after meals  Aciphex      Maalox     Rash:  Milk of Magnesia     Benadryl    Mylanta       1% Hydrocortisone Cream  Pepcid  Pepcid Complete   Sleep Aids:  Prevacid      Ambien   Prilosec       Benadryl  Rolaids       Chamomile Tea  Tums (Limit 4/day)     Unisom  Zantac       Tylenol PM         Warm milk-add vanilla or  Hemorrhoids:       Sugar for taste  Anusol/Anusol H.C.  (RX: Analapram 2.5%)  Sugar Substitutes:  Hydrocortisone OTC     Ok in moderation  Preparation H      Tucks        Vaseline lotion applied to tissue with wiping    Herpes:     Throat:  Acyclovir      Oragel  Famvir  Valtrex     Vaccines:         Flu Shot Leg Cramps:       *Gardasil  Benadryl      Hepatitis A         Hepatitis B Nasal Spray:       Pneumovax  Saline Nasal Spray     Polio Booster         Tetanus Nausea:       Tuberculosis test or PPD  Vitamin B6 25 mg TID   AVOID:    Dramamine      *Gardasil  Emetrol       Live Poliovirus  Ginger Root 250 mg QID    MMR (measles, mumps &  High Complex Carbs @ Bedtime    rebella)  Sea Bands-Accupressure    Varicella (Chickenpox)  Unisom 1/2 tab TID     *No known complications           If received before Pain:         Known pregnancy;   Darvocet       Resume series after  Lortab        Delivery  Percocet    Yeast:   Tramadol      Femstat  Tylenol 3      Gyne-lotrimin  Ultram       Monistat  Vicodin           MISC:         All Sunscreens           Hair Coloring/highlights          Insect Repellant's          (Including DEET)         Mystic Tans Third Trimester of Pregnancy The third trimester is from week 29 through week 42, months 7 through 9. This trimester is when your  unborn baby (fetus) is growing very fast. At the end of the ninth month, the unborn baby is about 20 inches in length. It weighs about 6-10 pounds. Follow these instructions at home:  Avoid all smoking, herbs, and alcohol. Avoid drugs not approved by your doctor.  Do not use any tobacco products, including cigarettes, chewing tobacco, and electronic cigarettes. If you need help quitting, ask your doctor. You may get counseling or other support to help you quit.  Only take medicine as told by your doctor. Some medicines are safe and some are not during pregnancy.  Exercise only as told by your doctor. Stop exercising if you start having cramps.  Eat regular, healthy meals.  Wear a good support bra if your breasts are tender.  Do not use  hot tubs, steam rooms, or saunas.  Wear your seat belt when driving.  Avoid raw meat, uncooked cheese, and liter boxes and soil used by cats.  Take your prenatal vitamins.  Take 1500-2000 milligrams of calcium daily starting at the 20th week of pregnancy until you deliver your baby.  Try taking medicine that helps you poop (stool softener) as needed, and if your doctor approves. Eat more fiber by eating fresh fruit, vegetables, and whole grains. Drink enough fluids to keep your pee (urine) clear or pale yellow.  Take warm water baths (sitz baths) to soothe pain or discomfort caused by hemorrhoids. Use hemorrhoid cream if your doctor approves.  If you have puffy, bulging veins (varicose veins), wear support hose. Raise (elevate) your feet for 15 minutes, 3-4 times a day. Limit salt in your diet.  Avoid heavy lifting, wear low heels, and sit up straight.  Rest with your legs raised if you have leg cramps or low back pain.  Visit your dentist if you have not gone during your pregnancy. Use a soft toothbrush to brush your teeth. Be gentle when you floss.  You can have sex (intercourse) unless your doctor tells you not to.  Do not travel far distances  unless you must. Only do so with your doctor's approval.  Take prenatal classes.  Practice driving to the hospital.  Pack your hospital bag.  Prepare the baby's room.  Go to your doctor visits. Get help if:  You are not sure if you are in labor or if your water has broken.  You are dizzy.  You have mild cramps or pressure in your lower belly (abdominal).  You have a nagging pain in your belly area.  You continue to feel sick to your stomach (nauseous), throw up (vomit), or have watery poop (diarrhea).  You have bad smelling fluid coming from your vagina.  You have pain with peeing (urination). Get help right away if:  You have a fever.  You are leaking fluid from your vagina.  You are spotting or bleeding from your vagina.  You have severe belly cramping or pain.  You lose or gain weight rapidly.  You have trouble catching your breath and have chest pain.  You notice sudden or extreme puffiness (swelling) of your face, hands, ankles, feet, or legs.  You have not felt the baby move in over an hour.  You have severe headaches that do not go away with medicine.  You have vision changes. This information is not intended to replace advice given to you by your health care provider. Make sure you discuss any questions you have with your health care provider. Document Released: 11/30/2009 Document Revised: 02/11/2016 Document Reviewed: 11/06/2012 Elsevier Interactive Patient Education  2017 [place] and Pregnancy Immunizations, or vaccines, can help to keep you healthy. They can also protect your baby from some diseases until your baby is old enough to safely receive them. If you are pregnant or you are planning a pregnancy, the vaccines that you need are determined by:  Your age.  Your lifestyle.  Your medical history.  Your travel plans.  Your previous vaccines.  The benefits of receiving immunizations during pregnancy usually outweigh  the risks:  When the risk of being exposed to a disease is high.  When infection would pose a risk to you or your unborn baby.  When the vaccine is not likely to cause harm.  Should I receive immunizations before pregnancy? If possible, make sure that  your vaccines are up to date before you become pregnant. It is safe and important for you to receive weakened viral and weakened bacterial (inactivated) vaccines, as needed, before you are pregnant. Live viral and live bacterial (attenuated) vaccines should be given 1 month or more before pregnancy. Some examples of attenuated vaccines include:  Live attenuated influenza vaccine (LAIV).  Measles, mumps, and rubella (MMR).  Measles, mumps, rubella, and varicella (MMRV).  Rotavirus (RV5 or RV1).  Smallpox.  Typhoid (Ty21a, oral capsule form of the vaccine).  Varicella (VAR).  Shingles.  Yellow fever (YF).  If you become pregnant within 1 month after you have received an attenuated vaccine, contact your health care provider. Should I receive immunizations during pregnancy? It is safe and important for you to receive inactivated vaccines as needed during pregnancy. Until your baby can receive vaccines, your baby will get some protection from diseases through the vaccines that you receive while you are pregnant. However, some inactivated vaccines have not been thoroughly studied in pregnant women, and at this time, they are not recommended during pregnancy unless the benefits outweigh the risks. One example is the pneumococcal polysaccharide vaccine (PPSV23). In addition, the human papillomavirus (HPV4 or HPV2) vaccine is not recommended during pregnancy. You should receive inactivated influenza (IIV) and adult tetanus, diphtheria, and acellular pertussis (Tdap) vaccines during your pregnancy. The IIV, which is known as "the flu shot," will protect you and your baby (up to 62 months of age) from some complications and strains of influenza.  Pregnant women can receive IIV at any time and during any trimester. The Tdap vaccine will help to prevent whooping cough (pertussis) in you and your baby. You should receive 1 dose of this vaccine during each pregnancy. It is recommended that pregnant women receive this vaccine during the 27th-36th weeks of pregnancy. Usually, attenuated vaccines are not given to pregnant women. There is a possible risk of passing the vaccine virus or bacteria to the unborn baby. If you are pregnant and you received an attenuated vaccine, contact your health care provider. Should I receive immunizations after pregnancy? It is safe and important for you to receive vaccines as needed after pregnancy. This is true even if you are breastfeeding. If you did not receive the Tdap vaccine during your pregnancy, you should receive that vaccine right after you give birth to your baby (delivery). If you are not immune to measles, mumps, rubella, or varicella, you should receive the MMR or MMRV vaccine within days after delivery. Most other vaccines are also safe to receive after pregnancy. What if I am pregnant and I plan to travel internationally? If you are pregnant and you are planning to travel internationally, talk with your health care provider at least 4-6 weeks before your trip. Discuss precautions or vaccine options. Before you receive vaccines, the risk of disease and immunization should always be determined. Immunizations that are recommended for pregnant international travelers include:  Hepatitis B (HepB).  IIV.  Tetanus and diphtheria (Td) or Tdap.  Hepatitis A (HepA).  Immunizations that should be delayed or given only when benefits outweigh the risk of disease exposure for pregnant international travelers include:  Japanese encephalitis (JE).  Meningococcal meningitis (MPSV4 or MCV4).  PPSV23.  Inactivated polio (IPV).  Rabies.  Typhoid.  YF.  Immunizations that should not be given to pregnant  international travelers include:  Tuberculosis (BCG).  MMR.  MMRV.  HPV4 or HPV2.  VAR.  LAIV.  This information is not intended to replace advice  given to you by your health care provider. Make sure you discuss any questions you have with your health care provider. Document Released: 09/25/2007 Document Revised: 02/05/2016 Document Reviewed: 10/13/2014 Elsevier Interactive Patient Education  [employer].

## 2017-08-04 NOTE — Progress Notes (Signed)
ROB- glucola done, blood consent, pt is doing well

## 2017-08-05 LAB — CBC
Hematocrit: 33.9 % — ABNORMAL LOW (ref 34.0–46.6)
Hemoglobin: 11.1 g/dL (ref 11.1–15.9)
MCH: 31.6 pg (ref 26.6–33.0)
MCHC: 32.7 g/dL (ref 31.5–35.7)
MCV: 97 fL (ref 79–97)
PLATELETS: 204 10*3/uL (ref 150–379)
RBC: 3.51 x10E6/uL — AB (ref 3.77–5.28)
RDW: 12.6 % (ref 12.3–15.4)
WBC: 10.7 10*3/uL (ref 3.4–10.8)

## 2017-08-05 LAB — GLUCOSE, 1 HOUR GESTATIONAL: GESTATIONAL DIABETES SCREEN: 132 mg/dL (ref 65–139)

## 2017-08-06 NOTE — Progress Notes (Signed)
ROB-Pt doing well, no questions or concerns. Plans breastfeeding and epidural. Unsure about PP contraception, booklet given. Glucola and CBC today. Education regarding TDaP, will think about getting next visit. Blood transfusion consent reviewed and signed. Labor support: mother, aunt, and FOB. Pt plans to stay with [facility]-midwives, no longer considering a transfer to [facility]. Reviewed red flag symptoms and when to call. RTC x 2 weeks for ROB with Deneise Lever or sooner if needed.

## 2017-08-07 ENCOUNTER — Telehealth: Payer: Self-pay | Admitting: Certified Nurse Midwife

## 2017-08-07 NOTE — Telephone Encounter (Signed)
Pt c/o of constipation. Constipation protocol given. If no relief, pt is to contact office.

## 2017-08-07 NOTE — Telephone Encounter (Signed)
Patient called and stated that she she has some questions about her 1 hr GT results, and would like for [facility] or her Nurse to give her a call back. No other information was disclosed. Please advise.

## 2017-08-09 ENCOUNTER — Telehealth: Payer: Self-pay | Admitting: Certified Nurse Midwife

## 2017-08-09 NOTE — Telephone Encounter (Signed)
Pt states she had a little spot of blood on tissue when she wiped and felt sure it was from her rectum. I advised pt to eat fresh fruits and vegetable, drink lots of water, stool softener daily, miralax as directed and may use Preparation H or Anusol cream rectally if hemorrhoids are an issue. Pt voiced understanding.

## 2017-08-09 NOTE — Telephone Encounter (Signed)
The patient called and stated that she would like to speak with a nurse or provider in regards to some questions and concerns she is having, No other information was disclosed. Please advise.

## 2017-08-18 ENCOUNTER — Ambulatory Visit (INDEPENDENT_AMBULATORY_CARE_PROVIDER_SITE_OTHER): Payer: Managed Care, Other (non HMO) | Admitting: Certified Nurse Midwife

## 2017-08-18 ENCOUNTER — Encounter: Payer: Self-pay | Admitting: Certified Nurse Midwife

## 2017-08-18 VITALS — BP 115/70 | HR 89 | Wt 169.7 lb

## 2017-08-18 DIAGNOSIS — Z23 Encounter for immunization: Secondary | ICD-10-CM | POA: Diagnosis not present

## 2017-08-18 DIAGNOSIS — Z3483 Encounter for supervision of other normal pregnancy, third trimester: Secondary | ICD-10-CM

## 2017-08-18 LAB — POCT URINALYSIS DIPSTICK
Bilirubin, UA: NEGATIVE
Glucose, UA: NEGATIVE
Ketones, UA: NEGATIVE
NITRITE UA: NEGATIVE
PH UA: 8 (ref 5.0–8.0)
PROTEIN UA: NEGATIVE
Spec Grav, UA: 1.01 (ref 1.010–1.025)
UROBILINOGEN UA: 0.2 U/dL

## 2017-08-18 NOTE — Progress Notes (Signed)
ROB, doing well. PT states taht she had an outbreak during thanksgiving due to stress. She states that it has resolved. Discussed daily use for suppression at 36 wks. She agrees to plan. She also complains of musculoskeletal discomfort in her rib area, encouraged use of pillows  On her sides to help with discomfort, use of tylenol and heating pad. She verbalizes understanding and agrees to plan. Follow up  In 2 wks.   Philip Aspen, CNM

## 2017-08-18 NOTE — Progress Notes (Signed)
ROB- Pt states she has been having upper abdominal pain, tdap today, unable to give urine sample

## 2017-08-18 NOTE — Patient Instructions (Signed)

## 2017-08-18 NOTE — Addendum Note (Signed)
Addended by: Benson Setting L on: 08/18/2017 10:55 AM   Modules accepted: Orders

## 2017-08-29 ENCOUNTER — Ambulatory Visit (INDEPENDENT_AMBULATORY_CARE_PROVIDER_SITE_OTHER): Payer: Managed Care, Other (non HMO) | Admitting: Obstetrics and Gynecology

## 2017-08-29 ENCOUNTER — Encounter: Payer: Managed Care, Other (non HMO) | Admitting: Obstetrics and Gynecology

## 2017-08-29 VITALS — BP 125/78 | HR 93 | Wt 171.7 lb

## 2017-08-29 DIAGNOSIS — Z3493 Encounter for supervision of normal pregnancy, unspecified, third trimester: Secondary | ICD-10-CM

## 2017-08-29 LAB — POCT URINALYSIS DIPSTICK
BILIRUBIN UA: NEGATIVE
Blood, UA: NEGATIVE
GLUCOSE UA: NEGATIVE
Ketones, UA: NEGATIVE
Leukocytes, UA: NEGATIVE
Nitrite, UA: NEGATIVE
Protein, UA: NEGATIVE
Spec Grav, UA: 1.01 (ref 1.010–1.025)
Urobilinogen, UA: 0.2 E.U./dL
pH, UA: 7 (ref 5.0–8.0)

## 2017-08-29 NOTE — Progress Notes (Signed)
ROB- pt is having some discharge with some odor

## 2017-08-29 NOTE — Progress Notes (Signed)
ROB-increased vaginal discharge with some odor. 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 Microscopic wet-mount exam shows negative for pathogens, normal epithelial cells.

## 2017-09-14 ENCOUNTER — Encounter: Payer: Self-pay | Admitting: Certified Nurse Midwife

## 2017-09-14 ENCOUNTER — Ambulatory Visit (INDEPENDENT_AMBULATORY_CARE_PROVIDER_SITE_OTHER): Payer: Managed Care, Other (non HMO) | Admitting: Certified Nurse Midwife

## 2017-09-14 VITALS — BP 130/80 | HR 103 | Wt 176.5 lb

## 2017-09-14 DIAGNOSIS — Z3493 Encounter for supervision of normal pregnancy, unspecified, third trimester: Secondary | ICD-10-CM

## 2017-09-14 LAB — POCT URINALYSIS DIPSTICK
BILIRUBIN UA: NEGATIVE
GLUCOSE UA: NEGATIVE
KETONES UA: NEGATIVE
Leukocytes, UA: NEGATIVE
Nitrite, UA: NEGATIVE
Protein, UA: NEGATIVE
RBC UA: NEGATIVE
SPEC GRAV UA: 1.025 (ref 1.010–1.025)
Urobilinogen, UA: 0.2 E.U./dL
pH, UA: 6.5 (ref 5.0–8.0)

## 2017-09-14 NOTE — Progress Notes (Signed)
ROB- Pt has been having increase vainal discharge, not really itching, or vaginal odor, pt would like to know if baby has dropped

## 2017-09-14 NOTE — Patient Instructions (Addendum)
Round Ligament Pain The round ligament is a cord of muscle and tissue that helps to support the uterus. It can become a source of pain during pregnancy if it becomes stretched or twisted as the baby grows. The pain usually begins in the second trimester of pregnancy, and it can come and go until the baby is delivered. It is not a serious problem, and it does not cause harm to the baby. Round ligament pain is usually a short, sharp, and pinching pain, but it can also be a dull, lingering, and aching pain. The pain is felt in the lower side of the abdomen or in the groin. It usually starts deep in the groin and moves up to the outside of the hip area. Pain can occur with:  A sudden change in position.  Rolling over in bed.  Coughing or sneezing.  Physical activity.  Follow these instructions at home: Watch your condition for any changes. Take these steps to help with your pain:  When the pain starts, relax. Then try: ? Sitting down. ? Flexing your knees up to your abdomen. ? Lying on your side with one pillow under your abdomen and another pillow between your legs. ? Sitting in a warm bath for 15-20 minutes or until the pain goes away.  Take over-the-counter and prescription medicines only as told by your health care provider.  Move slowly when you sit and stand.  Avoid long walks if they cause pain.  Stop or lessen your physical activities if they cause pain.  Contact a health care provider if:  Your pain does not go away with treatment.  You feel pain in your back that you did not have before.  Your medicine is not helping. Get help right away if:  You develop a fever or chills.  You develop uterine contractions.  You develop vaginal bleeding.  You develop nausea or vomiting.  You develop diarrhea.  You have pain when you urinate. This information is not intended to replace advice given to you by your health care provider. Make sure you discuss any questions you have  with your health care provider. Document Released: 06/14/2008 Document Revised: 02/11/2016 Document Reviewed: 11/12/2014 Elsevier Interactive Patient Education  2018 [employer]. Common Medications Safe in Pregnancy  Acne:      Constipation:  Benzoyl Peroxide     Colace  Clindamycin      Dulcolax Suppository  Topica Erythromycin     Fibercon  Salicylic Acid      Metamucil         Miralax AVOID:        Senakot   Accutane    Cough:  Retin-A       Cough Drops  Tetracycline      Phenergan w/ Codeine if Rx  Minocycline      Robitussin (Plain & DM)  Antibiotics:     Crabs/Lice:  Ceclor       RID  Cephalosporins    AVOID:  E-Mycins      Kwell  Keflex  Macrobid/Macrodantin   Diarrhea:  Penicillin      Kao-Pectate  Zithromax      Imodium AD         PUSH FLUIDS AVOID:       Cipro     Fever:  Tetracycline      Tylenol (Regular or Extra  Minocycline       Strength)  Levaquin      Extra Strength-Do not  Exceed 8 tabs/24 hrs Caffeine:        <253m/day (equiv. To 1 cup of coffee or  approx. 3 12 oz sodas)         Gas: Cold/Hayfever:       Gas-X  Benadryl      Mylicon  Claritin       Phazyme  **Claritin-D        Chlor-Trimeton    Headaches:  Dimetapp      ASA-Free Excedrin  Drixoral-Non-Drowsy     Cold Compress  Mucinex (Guaifenasin)     Tylenol (Regular or Extra  Sudafed/Sudafed-12 Hour     Strength)  **Sudafed PE Pseudoephedrine   Tylenol Cold & Sinus     Vicks Vapor Rub  Zyrtec  **AVOID if Problems With Blood Pressure         Heartburn: Avoid lying down for at least 1 hour after meals  Aciphex      Maalox     Rash:  Milk of Magnesia     Benadryl    Mylanta       1% Hydrocortisone Cream  Pepcid  Pepcid Complete   Sleep Aids:  Prevacid      Ambien   Prilosec       Benadryl  Rolaids       Chamomile Tea  Tums (Limit 4/day)     Unisom  Zantac       Tylenol PM         Warm milk-add vanilla or  Hemorrhoids:       Sugar for taste  Anusol/Anusol  H.C.  (RX: Analapram 2.5%)  Sugar Substitutes:  Hydrocortisone OTC     Ok in moderation  Preparation H      Tucks        Vaseline lotion applied to tissue with wiping    Herpes:     Throat:  Acyclovir      Oragel  Famvir  Valtrex     Vaccines:         Flu Shot Leg Cramps:       *Gardasil  Benadryl      Hepatitis A         Hepatitis B Nasal Spray:       Pneumovax  Saline Nasal Spray     Polio Booster         Tetanus Nausea:       Tuberculosis test or PPD  Vitamin B6 25 mg TID   AVOID:    Dramamine      *Gardasil  Emetrol       Live Poliovirus  Ginger Root 250 mg QID    MMR (measles, mumps &  High Complex Carbs @ Bedtime    rebella)  Sea Bands-Accupressure    Varicella (Chickenpox)  Unisom 1/2 tab TID     *No known complications           If received before Pain:         Known pregnancy;   Darvocet       Resume series after  Lortab        Delivery  Percocet    Yeast:   Tramadol      Femstat  Tylenol 3      Gyne-lotrimin  Ultram       Monistat  Vicodin           MISC:         All Sunscreens  Hair Coloring/highlights          Insect Repellant's          (Including DEET)         Mystic Tans Third Trimester of Pregnancy The third trimester is from week 29 through week 42, months 7 through 9. This trimester is when your unborn baby (fetus) is growing very fast. At the end of the ninth month, the unborn baby is about 20 inches in length. It weighs about 6-10 pounds. Follow these instructions at home:  Avoid all smoking, herbs, and alcohol. Avoid drugs not approved by your doctor.  Do not use any tobacco products, including cigarettes, chewing tobacco, and electronic cigarettes. If you need help quitting, ask your doctor. You may get counseling or other support to help you quit.  Only take medicine as told by your doctor. Some medicines are safe and some are not during pregnancy.  Exercise only as told by your doctor. Stop exercising if you start having  cramps.  Eat regular, healthy meals.  Wear a good support bra if your breasts are tender.  Do not use hot tubs, steam rooms, or saunas.  Wear your seat belt when driving.  Avoid raw meat, uncooked cheese, and liter boxes and soil used by cats.  Take your prenatal vitamins.  Take 1500-2000 milligrams of calcium daily starting at the 20th week of pregnancy until you deliver your baby.  Try taking medicine that helps you poop (stool softener) as needed, and if your doctor approves. Eat more fiber by eating fresh fruit, vegetables, and whole grains. Drink enough fluids to keep your pee (urine) clear or pale yellow.  Take warm water baths (sitz baths) to soothe pain or discomfort caused by hemorrhoids. Use hemorrhoid cream if your doctor approves.  If you have puffy, bulging veins (varicose veins), wear support hose. Raise (elevate) your feet for 15 minutes, 3-4 times a day. Limit salt in your diet.  Avoid heavy lifting, wear low heels, and sit up straight.  Rest with your legs raised if you have leg cramps or low back pain.  Visit your dentist if you have not gone during your pregnancy. Use a soft toothbrush to brush your teeth. Be gentle when you floss.  You can have sex (intercourse) unless your doctor tells you not to.  Do not travel far distances unless you must. Only do so with your doctor's approval.  Take prenatal classes.  Practice driving to the hospital.  Pack your hospital bag.  Prepare the baby's room.  Go to your doctor visits. Get help if:  You are not sure if you are in labor or if your water has broken.  You are dizzy.  You have mild cramps or pressure in your lower belly (abdominal).  You have a nagging pain in your belly area.  You continue to feel sick to your stomach (nauseous), throw up (vomit), or have watery poop (diarrhea).  You have bad smelling fluid coming from your vagina.  You have pain with peeing (urination). Get help right away  if:  You have a fever.  You are leaking fluid from your vagina.  You are spotting or bleeding from your vagina.  You have severe belly cramping or pain.  You lose or gain weight rapidly.  You have trouble catching your breath and have chest pain.  You notice sudden or extreme puffiness (swelling) of your face, hands, ankles, feet, or legs.  You have not felt the baby move in over an  hour.  You have severe headaches that do not go away with medicine.  You have vision changes. This information is not intended to replace advice given to you by your health care provider. Make sure you discuss any questions you have with your health care provider. Document Released: 11/30/2009 Document Revised: 02/11/2016 Document Reviewed: 11/06/2012 Elsevier Interactive Patient Education  2017 [place].  Back Pain in Pregnancy Back pain during pregnancy is common. Back pain may be caused by several factors that are related to changes during your pregnancy. Follow these instructions at home: Managing pain, stiffness, and swelling  If directed, apply ice for sudden (acute) back pain. ? Put ice in a plastic bag. ? Place a towel between your skin and the bag. ? Leave the ice on for 20 minutes, 2-3 times per day.  If directed, apply heat to the affected area before you exercise: ? Place a towel between your skin and the heat pack or heating pad. ? Leave the heat on for 20-30 minutes. ? Remove the heat if your skin turns bright red. This is especially important if you are unable to feel pain, heat, or cold. You may have a greater risk of getting burned. Activity  Exercise as told by your health care provider. Exercising is the best way to prevent or manage back pain.  Listen to your body when lifting. If lifting hurts, ask for help or bend your knees. This uses your leg muscles instead of your back muscles.  Squat down when picking up something from the floor. Do not bend over.  Only use  bed rest as told by your health care provider. Bed rest should only be used for the most severe episodes of back pain. Standing, Sitting, and Lying Down  Do not stand in one place for long periods of time.  Use good posture when sitting. Make sure your head rests over your shoulders and is not hanging forward. Use a pillow on your lower back if necessary.  Try sleeping on your side, preferably the left side, with a pillow or two between your legs. If you are sore after a night's rest, your bed may be too soft. A firm mattress may provide more support for your back during pregnancy. General instructions  Do not wear high heels.  Eat a healthy diet. Try to gain weight within your health care provider's recommendations.  Use a maternity girdle, elastic sling, or back brace as told by your health care provider.  Take over-the-counter and prescription medicines only as told by your health care provider.  Keep all follow-up visits as told by your health care provider. This is important. This includes any visits with any specialists, such as a physical therapist. Contact a health care provider if:  Your back pain interferes with your daily activities.  You have increasing pain in other parts of your body. Get help right away if:  You develop numbness, tingling, weakness, or problems with the use of your arms or legs.  You develop severe back pain that is not controlled with medicine.  You have a sudden change in bowel or bladder control.  You develop shortness of breath, dizziness, or you faint.  You develop nausea, vomiting, or sweating.  You have back pain that is a rhythmic, cramping pain similar to labor pains. Labor pain is usually 1-2 minutes apart, lasts for about 1 minute, and involves a bearing down feeling or pressure in your pelvis.  You have back pain and your water breaks  or you have vaginal bleeding.  You have back pain or numbness that travels down your leg.  Your  back pain developed after you fell.  You develop pain on one side of your back.  You see blood in your urine.  You develop skin blisters in the area of your back pain. This information is not intended to replace advice given to you by your health care provider. Make sure you discuss any questions you have with your health care provider. Document Released: 12/14/2005 Document Revised: 02/11/2016 Document Reviewed: 05/20/2015 Elsevier Interactive Patient Education  2018 [place].  Abdominal Pain During Pregnancy Belly (abdominal) pain is common during pregnancy. Most of the time, it is not a serious problem. Other times, it can be a sign that something is wrong with the pregnancy. Always tell your doctor if you have belly pain. Follow these instructions at home: Monitor your belly pain for any changes. The following actions may help you feel better:  Do not have sex (intercourse) or put anything in your vagina until you feel better.  Rest until your pain stops.  Drink clear fluids if you feel sick to your stomach (nauseous). Do not eat solid food until you feel better.  Only take medicine as told by your doctor.  Keep all doctor visits as told.  Get help right away if:  You are bleeding, leaking fluid, or pieces of tissue come out of your vagina.  You have more pain or cramping.  You keep throwing up (vomiting).  You have pain when you pee (urinate) or have blood in your pee.  You have a fever.  You do not feel your baby moving as much.  You feel very weak or feel like passing out.  You have trouble breathing, with or without belly pain.  You have a very bad headache and belly pain.  You have fluid leaking from your vagina and belly pain.  You keep having watery poop (diarrhea).  Your belly pain does not go away after resting, or the pain gets worse. This information is not intended to replace advice given to you by your health care provider. Make sure you  discuss any questions you have with your health care provider. Document Released: 08/24/2009 Document Revised: 04/13/2016 Document Reviewed: 04/04/2013 Elsevier Interactive Patient Education  [employer].

## 2017-09-18 NOTE — Progress Notes (Signed)
ROB-Reports increased vaginal discharge. Denies itching or odor. Declines NuSwab or Wet prep at this time. Discuss home vaginal health techniques. Anticipatory guidance regarding course of prenatal care. Reviewed red flag symptoms and when to call. RTC x 2-3 weeks for ROB with Deneise Lever.

## 2017-09-19 NOTE — L&D Delivery Note (Signed)
Delivery Note   Cynthia Harrell is a 32 y.o. [id number] at [redacted]w[redacted]d Estimated Date of Delivery: 11/02/17  PRE-OPERATIVE DIAGNOSIS:  1) [redacted]w[redacted]d pregnancy.   POST-OPERATIVE DIAGNOSIS:  1) [redacted]w[redacted]d pregnancy s/p Vaginal, Spontaneous   Delivery Type: Vaginal, Spontaneous    Delivery Anesthesia:   Epidural  Labor Complications:   none    ESTIMATED BLOOD LOSS: 350 ml    FINDINGS:   1) female infant, Apgar scores of 7    at 1 minute and 9    at 5 minutes and a birthweight of 148.85  ounces.    2) Nuchal cord: yes  SPECIMENS:   PLACENTA:   Appearance:   intact, 3 vessels   Removal:     Spontaneous    Disposition:   help per protocol then discarded  DISPOSITION:  Infant to left in stable condition in the delivery room, with L&D personnel and mother,  NARRATIVE SUMMARY: Labor course:  Ms. [name] is a [id number] at [redacted]w[redacted]d who presented for labor management.  She progressed well in labor with pitocin.  She received the appropriate anesthesia and proceeded to complete dilation. She evidenced good maternal expulsive effort during the second stage. She went on to deliver a viable female infant. The placenta delivered without problems and was noted to be complete. A perineal and vaginal examination was performed. Episiotomy/Lacerations: 2nd degree  Episiotomy or lacerations were repaired with Vicryl suture using local anesthesia.  The patient tolerated this well.  Philip Aspen, CNM  11/06/2017 2:14 PM

## 2017-09-20 ENCOUNTER — Telehealth: Payer: Self-pay | Admitting: Certified Nurse Midwife

## 2017-09-20 ENCOUNTER — Ambulatory Visit: Payer: Managed Care, Other (non HMO) | Admitting: Obstetrics and Gynecology

## 2017-09-20 NOTE — Telephone Encounter (Signed)
The patient called and stated that she would like to speak with a nurse as soon as possible she has some "Imporatant questions" . Please advise.

## 2017-09-20 NOTE — Telephone Encounter (Signed)
Spoke with pt she had some spotting after intercourse 2 nights ago, nothing now, baby is moving fine, advised pt to let us know if occurs again

## 2017-09-29 ENCOUNTER — Encounter: Payer: Self-pay | Admitting: Certified Nurse Midwife

## 2017-09-29 ENCOUNTER — Ambulatory Visit (INDEPENDENT_AMBULATORY_CARE_PROVIDER_SITE_OTHER): Payer: Managed Care, Other (non HMO) | Admitting: Certified Nurse Midwife

## 2017-09-29 VITALS — BP 126/72 | HR 96 | Wt 179.5 lb

## 2017-09-29 DIAGNOSIS — Z3493 Encounter for supervision of normal pregnancy, unspecified, third trimester: Secondary | ICD-10-CM

## 2017-09-29 LAB — POCT URINALYSIS DIPSTICK
Bilirubin, UA: NEGATIVE
Blood, UA: NEGATIVE
KETONES UA: NEGATIVE
Leukocytes, UA: NEGATIVE
NITRITE UA: NEGATIVE
Protein, UA: NEGATIVE
SPEC GRAV UA: 1.01 (ref 1.010–1.025)
Urobilinogen, UA: 0.2 E.U./dL
pH, UA: 7.5 (ref 5.0–8.0)

## 2017-09-29 NOTE — Progress Notes (Signed)
ROB- Patient feels well with no complaints.

## 2017-09-29 NOTE — Patient Instructions (Signed)
Group B Streptococcus Infection During Pregnancy Group B Streptococcus (GBS) is a type of bacteria (Streptococcus agalactiae) that is often found in healthy people, commonly in the rectum, vagina, and intestines. In people who are healthy and not pregnant, the bacteria rarely cause serious illness or complications. However, women who test positive for GBS during pregnancy can pass the bacteria to their baby during childbirth, which can cause serious infection in the baby after birth. Women with GBS may also have infections during their pregnancy or immediately after childbirth, such as such as urinary tract infections (UTIs) or infections of the uterus (uterine infections). Having GBS also increases a woman's risk of complications during pregnancy, such as early (preterm) labor or delivery, miscarriage, or stillbirth. Routine testing (screening) for GBS is recommended for all pregnant women. What increases the risk? You may have a higher risk for GBS infection during pregnancy if you had one during a past pregnancy. What are the signs or symptoms? In most cases, GBS infection does not cause symptoms in pregnant women. Signs and symptoms of a possible GBS-related infection may include:  Labor starting before the 37th week of pregnancy.  A UTI or bladder infection, which may cause: ? Fever. ? Pain or burning during urination. ? Frequent urination.  Fever during labor, along with: ? Bad-smelling discharge. ? Uterine tenderness. ? Rapid heartbeat in the mother, baby, or both.  Rare but serious symptoms of a possible GBS-related infection in women include:  Blood infection (septicemia). This may cause fever, chills, or confusion.  Lung infection (pneumonia). This may cause fever, chills, cough, rapid breathing, difficulty breathing, or chest pain.  Bone, joint, skin, or soft tissue infection.  How is this diagnosed? You may be screened for GBS between week 35 and week 37 of your pregnancy. If  you have symptoms of preterm labor, you may be screened earlier. This condition is diagnosed based on lab test results from:  A swab of fluid from the vagina and rectum.  A urine sample.  How is this treated? This condition is treated with antibiotic medicine. When you go into labor, or as soon as your water breaks (your membranes rupture), you will be given antibiotics through an IV tube. Antibiotics will continue until after you give birth. If you are having a cesarean delivery, you do not need antibiotics unless your membranes have already ruptured. Follow these instructions at home:  Take over-the-counter and prescription medicines only as told by your health care provider.  Take your antibiotic medicine as told by your health care provider. Do not stop taking the antibiotic even if you start to feel better.  Keep all pre-birth (prenatal) visits and follow-up visits as told by your health care provider. This is important. Contact a health care provider if:  You have pain or burning when you urinate.  You have to urinate frequently.  You have a fever or chills.  You develop a bad-smelling vaginal discharge. Get help right away if:  Your membranes rupture.  You go into labor.  You have severe pain in your abdomen.  You have difficulty breathing.  You have chest pain. This information is not intended to replace advice given to you by your health care provider. Make sure you discuss any questions you have with your health care provider. Document Released: 12/13/2007 Document Revised: 04/01/2016 Document Reviewed: 03/31/2016 Elsevier Interactive Patient Education  2018 [employer]  

## 2017-09-29 NOTE — Progress Notes (Signed)
Body mass index is 28.97 kg/m. ROB, doing well. States that she had some spotting after intercourse. Reassurance given. Reviewed spotting and when to notify CNM. Feels baby moving. Denies contractions. Anticiapitory guidance for GBS testing next week. ROB in one wk.   Philip Aspen, CNM

## 2017-10-06 ENCOUNTER — Ambulatory Visit (INDEPENDENT_AMBULATORY_CARE_PROVIDER_SITE_OTHER): Payer: Managed Care, Other (non HMO) | Admitting: Certified Nurse Midwife

## 2017-10-06 ENCOUNTER — Encounter: Payer: Managed Care, Other (non HMO) | Admitting: Certified Nurse Midwife

## 2017-10-06 VITALS — BP 137/83 | HR 96 | Wt 180.2 lb

## 2017-10-06 DIAGNOSIS — Z3493 Encounter for supervision of normal pregnancy, unspecified, third trimester: Secondary | ICD-10-CM

## 2017-10-06 DIAGNOSIS — Z113 Encounter for screening for infections with a predominantly sexual mode of transmission: Secondary | ICD-10-CM

## 2017-10-06 LAB — POCT URINALYSIS DIPSTICK
Bilirubin, UA: NEGATIVE
Blood, UA: NEGATIVE
GLUCOSE UA: NEGATIVE
Ketones, UA: NEGATIVE
LEUKOCYTES UA: NEGATIVE
NITRITE UA: NEGATIVE
PROTEIN UA: NEGATIVE
Spec Grav, UA: <=1.005 — AB (ref 1.010–1.025)
Urobilinogen, UA: 0.2 E.U./dL
pH, UA: 7 (ref 5.0–8.0)

## 2017-10-06 NOTE — Patient Instructions (Signed)
Vaginal Delivery Vaginal delivery means that you will give birth by pushing your baby out of your birth canal (vagina). A team of health care providers will help you before, during, and after vaginal delivery. Birth experiences are unique for every woman and every pregnancy, and birth experiences vary depending on where you choose to give birth. What should I do to prepare for my baby's birth? Before your baby is born, it is important to talk with your health care provider about:  Your labor and delivery preferences. These may include: ? Medicines that you may be given. ? How you will manage your pain. This might include non-medical pain relief techniques or injectable pain relief such as epidural analgesia. ? How you and your baby will be monitored during labor and delivery. ? Who may be in the labor and delivery room with you. ? Your feelings about surgical delivery of your baby (cesarean delivery, or C-section) if this becomes necessary. ? Your feelings about receiving donated blood through an IV tube (blood transfusion) if this becomes necessary.  Whether you are able: ? To take pictures or videos of the birth. ? To eat during labor and delivery. ? To move around, walk, or change positions during labor and delivery.  What to expect after your baby is born, such as: ? Whether delayed umbilical cord clamping and cutting is offered. ? Who will care for your baby right after birth. ? Medicines or tests that may be recommended for your baby. ? Whether breastfeeding is supported in your hospital or birth center. ? How long you will be in the hospital or birth center.  How any medical conditions you have may affect your baby or your labor and delivery experience.  To prepare for your baby's birth, you should also:  Attend all of your health care visits before delivery (prenatal visits) as recommended by your health care provider. This is important.  Prepare your home for your baby's  arrival. Make sure that you have: ? Diapers. ? Baby clothing. ? Feeding equipment. ? Safe sleeping arrangements for you and your baby.  Install a car seat in your vehicle. Have your car seat checked by a certified car seat installer to make sure that it is installed safely.  Think about who will help you with your new baby at home for at least the first several weeks after delivery.  What can I expect when I arrive at the birth center or hospital? Once you are in labor and have been admitted into the hospital or birth center, your health care provider may:  Review your pregnancy history and any concerns you have.  Insert an IV tube into one of your veins. This is used to give you fluids and medicines.  Check your blood pressure, pulse, temperature, and heart rate (vital signs).  Check whether your bag of water (amniotic sac) has broken (ruptured).  Talk with you about your birth plan and discuss pain control options.  Monitoring Your health care provider may monitor your contractions (uterine monitoring) and your baby's heart rate (fetal monitoring). You may need to be monitored:  Often, but not continuously (intermittently).  All the time or for long periods at a time (continuously). Continuous monitoring may be needed if: ? You are taking certain medicines, such as medicine to relieve pain or make your contractions stronger. ? You have pregnancy or labor complications.  Monitoring may be done by:  Placing a special stethoscope or a handheld monitoring device on your abdomen to   check your baby's heartbeat, and feeling your abdomen for contractions. This method of monitoring does not continuously record your baby's heartbeat or your contractions.  Placing monitors on your abdomen (external monitors) to record your baby's heartbeat and the frequency and length of contractions. You may not have to wear external monitors all the time.  Placing monitors inside of your uterus  (internal monitors) to record your baby's heartbeat and the frequency, length, and strength of your contractions. ? Your health care provider may use internal monitors if he or she needs more information about the strength of your contractions or your baby's heart rate. ? Internal monitors are put in place by passing a thin, flexible wire through your vagina and into your uterus. Depending on the type of monitor, it may remain in your uterus or on your baby's head until birth. ? Your health care provider will discuss the benefits and risks of internal monitoring with you and will ask for your permission before inserting the monitors.  Telemetry. This is a type of continuous monitoring that can be done with external or internal monitors. Instead of having to stay in bed, you are able to move around during telemetry. Ask your health care provider if telemetry is an option for you.  Physical exam Your health care provider may perform a physical exam. This may include:  Checking whether your baby is positioned: ? With the head toward your vagina (head-down). This is most common. ? With the head toward the top of your uterus (head-up or breech). If your baby is in a breech position, your health care provider may try to turn your baby to a head-down position so you can deliver vaginally. If it does not seem that your baby can be born vaginally, your provider may recommend surgery to deliver your baby. In rare cases, you may be able to deliver vaginally if your baby is head-up (breech delivery). ? Lying sideways (transverse). Babies that are lying sideways cannot be delivered vaginally.  Checking your cervix to determine: ? Whether it is thinning out (effacing). ? Whether it is opening up (dilating). ? How low your baby has moved into your birth canal.  What are the three stages of labor and delivery?  Normal labor and delivery is divided into the following three stages: Stage 1  Stage 1 is the  longest stage of labor, and it can last for hours or days. Stage 1 includes: ? Early labor. This is when contractions may be irregular, or regular and mild. Generally, early labor contractions are more than 10 minutes apart. ? Active labor. This is when contractions get longer, more regular, more frequent, and more intense. ? The transition phase. This is when contractions happen very close together, are very intense, and may last longer than during any other part of labor.  Contractions generally feel mild, infrequent, and irregular at first. They get stronger, more frequent (about every 2-3 minutes), and more regular as you progress from early labor through active labor and transition.  Many women progress through stage 1 naturally, but you may need help to continue making progress. If this happens, your health care provider may talk with you about: ? Rupturing your amniotic sac if it has not ruptured yet. ? Giving you medicine to help make your contractions stronger and more frequent.  Stage 1 ends when your cervix is completely dilated to 4 inches (10 cm) and completely effaced. This happens at the end of the transition phase. Stage 2  Once   your cervix is completely effaced and dilated to 4 inches (10 cm), you may start to feel an urge to push. It is common for the body to naturally take a rest before feeling the urge to push, especially if you received an epidural or certain other pain medicines. This rest period may last for up to 1-2 hours, depending on your unique labor experience.  During stage 2, contractions are generally less painful, because pushing helps relieve contraction pain. Instead of contraction pain, you may feel stretching and burning pain, especially when the widest part of your baby's head passes through the vaginal opening (crowning).  Your health care provider will closely monitor your pushing progress and your baby's progress through the vagina during stage 2.  Your  health care provider may massage the area of skin between your vaginal opening and anus (perineum) or apply warm compresses to your perineum. This helps it stretch as the baby's head starts to crown, which can help prevent perineal tearing. ? In some cases, an incision may be made in your perineum (episiotomy) to allow the baby to pass through the vaginal opening. An episiotomy helps to make the opening of the vagina larger to allow more room for the baby to fit through.  It is very important to breathe and focus so your health care provider can control the delivery of your baby's head. Your health care provider may have you decrease the intensity of your pushing, to help prevent perineal tearing.  After delivery of your baby's head, the shoulders and the rest of the body generally deliver very quickly and without difficulty.  Once your baby is delivered, the umbilical cord may be cut right away, or this may be delayed for 1-2 minutes, depending on your baby's health. This may vary among health care providers, hospitals, and birth centers.  If you and your baby are healthy enough, your baby may be placed on your chest or abdomen to help maintain the baby's temperature and to help you bond with each other. Some mothers and babies start breastfeeding at this time. Your health care team will dry your baby and help keep your baby warm during this time.  Your baby may need immediate care if he or she: ? Showed signs of distress during labor. ? Has a medical condition. ? Was born too early (prematurely). ? Had a bowel movement before birth (meconium). ? Shows signs of difficulty transitioning from being inside the uterus to being outside of the uterus. If you are planning to breastfeed, your health care team will help you begin a feeding. Stage 3  The third stage of labor starts immediately after the birth of your baby and ends after you deliver the placenta. The placenta is an organ that develops  during pregnancy to provide oxygen and nutrients to your baby in the womb.  Delivering the placenta may require some pushing, and you may have mild contractions. Breastfeeding can stimulate contractions to help you deliver the placenta.  After the placenta is delivered, your uterus should tighten (contract) and become firm. This helps to stop bleeding in your uterus. To help your uterus contract and to control bleeding, your health care provider may: ? Give you medicine by injection, through an IV tube, by mouth, or through your rectum (rectally). ? Massage your abdomen or perform a vaginal exam to remove any blood clots that are left in your uterus. ? Empty your bladder by placing a thin, flexible tube (catheter) into your bladder. ? Encourage   you to breastfeed your baby. After labor is over, you and your baby will be monitored closely to ensure that you are both healthy until you are ready to go home. Your health care team will teach you how to care for yourself and your baby. This information is not intended to replace advice given to you by your health care provider. Make sure you discuss any questions you have with your health care provider. Document Released: 06/14/2008 Document Revised: 03/25/2016 Document Reviewed: 09/20/2015 Elsevier Interactive Patient Education  2018 [employer]  

## 2017-10-06 NOTE — Progress Notes (Signed)
ROB- Pt states she is doing well, has noticed an increase in discharge, Denies vaginal itching

## 2017-10-07 NOTE — Progress Notes (Signed)
ROB-Doing well, reports increased vaginal discharge. Denies itching or odor. 36 week cultures collected. SVE declined. Started HSV prophylaxis last week. Anticipatory guidance regarding course of prenatal care. Reviewed red flag symptoms and when to call. RTC x 1 week for ROB or sooner if needed.

## 2017-10-08 LAB — STREP GP B NAA: Strep Gp B NAA: NEGATIVE

## 2017-10-09 LAB — GC/CHLAMYDIA PROBE AMP
Chlamydia trachomatis, NAA: NEGATIVE
Neisseria gonorrhoeae by PCR: NEGATIVE

## 2017-10-11 ENCOUNTER — Ambulatory Visit (INDEPENDENT_AMBULATORY_CARE_PROVIDER_SITE_OTHER): Payer: Managed Care, Other (non HMO) | Admitting: Certified Nurse Midwife

## 2017-10-11 VITALS — BP 123/77 | HR 101 | Wt 180.1 lb

## 2017-10-11 DIAGNOSIS — Z3493 Encounter for supervision of normal pregnancy, unspecified, third trimester: Secondary | ICD-10-CM

## 2017-10-11 LAB — POCT URINALYSIS DIPSTICK
Bilirubin, UA: NEGATIVE
Glucose, UA: NEGATIVE
Ketones, UA: NEGATIVE
LEUKOCYTES UA: NEGATIVE
NITRITE UA: NEGATIVE
PROTEIN UA: NEGATIVE
RBC UA: NEGATIVE
SPEC GRAV UA: 1.01 (ref 1.010–1.025)
Urobilinogen, UA: 0.2 E.U./dL
pH, UA: 5 (ref 5.0–8.0)

## 2017-10-11 NOTE — Progress Notes (Signed)
Pt is here for an [place] visit. States she is stressed and is weepy.

## 2017-10-11 NOTE — Progress Notes (Addendum)
ROB, doing well. Feels fetal movement. Complains of not sleeping well. Suggested using benadryl or Unisom to help. She has a lot of anxiety and stress and is asking about starting her FMLA for anxiety. Discussed anxiety with her , she thinks it is due to lack of sleep. Reviewed medication options for anxiety. She is declining at this time. Work note today for breaks q 3 hrs to allow her to stand. Discussed labor precautions follow up 1 wk.   Philip Aspen, CNM

## 2017-10-11 NOTE — Patient Instructions (Signed)
Braxton Hicks Contractions °Contractions of the uterus can occur throughout pregnancy, but they are not always a sign that you are in labor. You may have practice contractions called Braxton Hicks contractions. These false labor contractions are sometimes confused with true labor. °What are Braxton Hicks contractions? °Braxton Hicks contractions are tightening movements that occur in the muscles of the uterus before labor. Unlike true labor contractions, these contractions do not result in opening (dilation) and thinning of the cervix. Toward the end of pregnancy (32-34 weeks), Braxton Hicks contractions can happen more often and may become stronger. These contractions are sometimes difficult to tell apart from true labor because they can be very uncomfortable. You should not feel embarrassed if you go to the hospital with false labor. °Sometimes, the only way to tell if you are in true labor is for your health care provider to look for changes in the cervix. The health care provider will do a physical exam and may monitor your contractions. If you are not in true labor, the exam should show that your cervix is not dilating and your water has not broken. °If there are other health problems associated with your pregnancy, it is completely safe for you to be sent home with false labor. You may continue to have Braxton Hicks contractions until you go into true labor. °How to tell the difference between true labor and false labor °True labor °· Contractions last 30-70 seconds. °· Contractions become very regular. °· Discomfort is usually felt in the top of the uterus, and it spreads to the lower abdomen and low back. °· Contractions do not go away with walking. °· Contractions usually become more intense and increase in frequency. °· The cervix dilates and gets thinner. °False labor °· Contractions are usually shorter and not as strong as true labor contractions. °· Contractions are usually irregular. °· Contractions  are often felt in the front of the lower abdomen and in the groin. °· Contractions may go away when you walk around or change positions while lying down. °· Contractions get weaker and are shorter-lasting as time goes on. °· The cervix usually does not dilate or become thin. °Follow these instructions at home: °· Take over-the-counter and prescription medicines only as told by your health care provider. °· Keep up with your usual exercises and follow other instructions from your health care provider. °· Eat and drink lightly if you think you are going into labor. °· If Braxton Hicks contractions are making you uncomfortable: °? Change your position from lying down or resting to walking, or change from walking to resting. °? Sit and rest in a tub of warm water. °? Drink enough fluid to keep your urine pale yellow. Dehydration may cause these contractions. °? Do slow and deep breathing several times an hour. °· Keep all follow-up prenatal visits as told by your health care provider. This is important. °Contact a health care provider if: °· You have a fever. °· You have continuous pain in your abdomen. °Get help right away if: °· Your contractions become stronger, more regular, and closer together. °· You have fluid leaking or gushing from your vagina. °· You pass blood-tinged mucus (bloody show). °· You have bleeding from your vagina. °· You have low back pain that you never had before. °· You feel your baby’s head pushing down and causing pelvic pressure. °· Your baby is not moving inside you as much as it used to. °Summary °· Contractions that occur before labor are called Braxton   Hicks contractions, false labor, or practice contractions. °· Braxton Hicks contractions are usually shorter, weaker, farther apart, and less regular than true labor contractions. True labor contractions usually become progressively stronger and regular and they become more frequent. °· Manage discomfort from Braxton Hicks contractions by  changing position, resting in a warm bath, drinking plenty of water, or practicing deep breathing. °This information is not intended to replace advice given to you by your health care provider. Make sure you discuss any questions you have with your health care provider. °Document Released: 01/19/2017 Document Revised: 01/19/2017 Document Reviewed: 01/19/2017 °Elsevier Interactive Patient Education © 2018 [employer] ° °

## 2017-10-17 ENCOUNTER — Encounter: Payer: Managed Care, Other (non HMO) | Admitting: Certified Nurse Midwife

## 2017-10-17 ENCOUNTER — Ambulatory Visit (INDEPENDENT_AMBULATORY_CARE_PROVIDER_SITE_OTHER): Payer: Managed Care, Other (non HMO) | Admitting: Certified Nurse Midwife

## 2017-10-17 VITALS — BP 116/70 | HR 98 | Wt 182.4 lb

## 2017-10-17 DIAGNOSIS — Z3493 Encounter for supervision of normal pregnancy, unspecified, third trimester: Secondary | ICD-10-CM | POA: Diagnosis not present

## 2017-10-17 LAB — POCT URINALYSIS DIPSTICK
BILIRUBIN UA: NEGATIVE
GLUCOSE UA: NEGATIVE
Ketones, UA: NEGATIVE
LEUKOCYTES UA: NEGATIVE
Nitrite, UA: NEGATIVE
PH UA: 7 (ref 5.0–8.0)
Protein, UA: NEGATIVE
RBC UA: NEGATIVE
Spec Grav, UA: 1.01 (ref 1.010–1.025)
UROBILINOGEN UA: 0.2 U/dL

## 2017-10-17 NOTE — Progress Notes (Signed)
Pt is here for an ROB. C/o backache since last night.

## 2017-10-17 NOTE — Patient Instructions (Addendum)
Common Medications Safe in Pregnancy  Acne:      Constipation:  Benzoyl Peroxide     Colace  Clindamycin      Dulcolax Suppository  Topica Erythromycin     Fibercon  Salicylic Acid      Metamucil         Miralax AVOID:        Senakot   Accutane    Cough:  Retin-A       Cough Drops  Tetracycline      Phenergan w/ Codeine if Rx  Minocycline      Robitussin (Plain & DM)  Antibiotics:     Crabs/Lice:  Ceclor       RID  Cephalosporins    AVOID:  E-Mycins      Kwell  Keflex  Macrobid/Macrodantin   Diarrhea:  Penicillin      Kao-Pectate  Zithromax      Imodium AD         PUSH FLUIDS AVOID:       Cipro     Fever:  Tetracycline      Tylenol (Regular or Extra  Minocycline       Strength)  Levaquin      Extra Strength-Do not          Exceed 8 tabs/24 hrs Caffeine:        <200mg/day (equiv. To 1 cup of coffee or  approx. 3 12 oz sodas)         Gas: Cold/Hayfever:       Gas-X  Benadryl      Mylicon  Claritin       Phazyme  **Claritin-D        Chlor-Trimeton    Headaches:  Dimetapp      ASA-Free Excedrin  Drixoral-Non-Drowsy     Cold Compress  Mucinex (Guaifenasin)     Tylenol (Regular or Extra  Sudafed/Sudafed-12 Hour     Strength)  **Sudafed PE Pseudoephedrine   Tylenol Cold & Sinus     Vicks Vapor Rub  Zyrtec  **AVOID if Problems With Blood Pressure         Heartburn: Avoid lying down for at least 1 hour after meals  Aciphex      Maalox     Rash:  Milk of Magnesia     Benadryl    Mylanta       1% Hydrocortisone Cream  Pepcid  Pepcid Complete   Sleep Aids:  Prevacid      Ambien   Prilosec       Benadryl  Rolaids       Chamomile Tea  Tums (Limit 4/day)     Unisom  Zantac       Tylenol PM         Warm milk-add vanilla or  Hemorrhoids:       Sugar for taste  Anusol/Anusol H.C.  (RX: Analapram 2.5%)  Sugar Substitutes:  Hydrocortisone OTC     Ok in moderation  Preparation H      Tucks        Vaseline lotion applied to tissue with  wiping    Herpes:     Throat:  Acyclovir      Oragel  Famvir  Valtrex     Vaccines:         Flu Shot Leg Cramps:       *Gardasil  Benadryl      Hepatitis A         Hepatitis B Nasal Spray:         Pneumovax  Saline Nasal Spray     Polio Booster         Tetanus Nausea:       Tuberculosis test or PPD  Vitamin B6 25 mg TID   AVOID:    Dramamine      *Gardasil  Emetrol       Live Poliovirus  Ginger Root 250 mg QID    MMR (measles, mumps &  High Complex Carbs @ Bedtime    rebella)  Sea Bands-Accupressure    Varicella (Chickenpox)  Unisom 1/2 tab TID     *No known complications           If received before Pain:         Known pregnancy;   Darvocet       Resume series after  Lortab        Delivery  Percocet    Yeast:   Tramadol      Femstat  Tylenol 3      Gyne-lotrimin  Ultram       Monistat  Vicodin           MISC:         All Sunscreens           Hair Coloring/highlights          Insect Repellant's          (Including DEET)         Mystic Tans Back Pain in Pregnancy Back pain during pregnancy is common. Back pain may be caused by several factors that are related to changes during your pregnancy. Follow these instructions at home: Managing pain, stiffness, and swelling  If directed, apply ice for sudden (acute) back pain. ? Put ice in a plastic bag. ? Place a towel between your skin and the bag. ? Leave the ice on for 20 minutes, 2-3 times per day.  If directed, apply heat to the affected area before you exercise: ? Place a towel between your skin and the heat pack or heating pad. ? Leave the heat on for 20-30 minutes. ? Remove the heat if your skin turns bright red. This is especially important if you are unable to feel pain, heat, or cold. You may have a greater risk of getting burned. Activity  Exercise as told by your health care provider. Exercising is the best way to prevent or manage back pain.  Listen to your body when lifting. If lifting hurts, ask for help or  bend your knees. This uses your leg muscles instead of your back muscles.  Squat down when picking up something from the floor. Do not bend over.  Only use bed rest as told by your health care provider. Bed rest should only be used for the most severe episodes of back pain. Standing, Sitting, and Lying Down  Do not stand in one place for long periods of time.  Use good posture when sitting. Make sure your head rests over your shoulders and is not hanging forward. Use a pillow on your lower back if necessary.  Try sleeping on your side, preferably the left side, with a pillow or two between your legs. If you are sore after a night's rest, your bed may be too soft. A firm mattress may provide more support for your back during pregnancy. General instructions  Do not wear high heels.  Eat a healthy diet. Try to gain weight within your health care provider's recommendations.  Use a maternity girdle, elastic sling, or   back brace as told by your health care provider.  Take over-the-counter and prescription medicines only as told by your health care provider.  Keep all follow-up visits as told by your health care provider. This is important. This includes any visits with any specialists, such as a physical therapist. Contact a health care provider if:  Your back pain interferes with your daily activities.  You have increasing pain in other parts of your body. Get help right away if:  You develop numbness, tingling, weakness, or problems with the use of your arms or legs.  You develop severe back pain that is not controlled with medicine.  You have a sudden change in bowel or bladder control.  You develop shortness of breath, dizziness, or you faint.  You develop nausea, vomiting, or sweating.  You have back pain that is a rhythmic, cramping pain similar to labor pains. Labor pain is usually 1-2 minutes apart, lasts for about 1 minute, and involves a bearing down feeling or pressure in  your pelvis.  You have back pain and your water breaks or you have vaginal bleeding.  You have back pain or numbness that travels down your leg.  Your back pain developed after you fell.  You develop pain on one side of your back.  You see blood in your urine.  You develop skin blisters in the area of your back pain. This information is not intended to replace advice given to you by your health care provider. Make sure you discuss any questions you have with your health care provider. Document Released: 12/14/2005 Document Revised: 02/11/2016 Document Reviewed: 05/20/2015 Elsevier Interactive Patient Education  2018 [employer] Inc.  

## 2017-10-18 NOTE — Progress Notes (Signed)
ROB-Reports worsening backaches. Discussed home treatment measures including use of abdominal support. Answered questions regarding cervical exams during visits, office induction policy, and signs and symptoms of labor. Reviewed red flag symptoms and when to call. RTC x 1 week for ROB or sooner if needed.

## 2017-10-24 ENCOUNTER — Ambulatory Visit (INDEPENDENT_AMBULATORY_CARE_PROVIDER_SITE_OTHER): Payer: Managed Care, Other (non HMO) | Admitting: Certified Nurse Midwife

## 2017-10-24 ENCOUNTER — Telehealth: Payer: Self-pay | Admitting: Certified Nurse Midwife

## 2017-10-24 ENCOUNTER — Telehealth: Payer: Self-pay

## 2017-10-24 ENCOUNTER — Encounter: Payer: Self-pay | Admitting: Certified Nurse Midwife

## 2017-10-24 VITALS — BP 94/65 | HR 92 | Wt 184.1 lb

## 2017-10-24 DIAGNOSIS — Z3493 Encounter for supervision of normal pregnancy, unspecified, third trimester: Secondary | ICD-10-CM

## 2017-10-24 LAB — POCT URINALYSIS DIPSTICK
Bilirubin, UA: NEGATIVE
GLUCOSE UA: NEGATIVE
KETONES UA: NEGATIVE
Leukocytes, UA: NEGATIVE
Nitrite, UA: NEGATIVE
PH UA: 7.5 (ref 5.0–8.0)
Protein, UA: NEGATIVE
RBC UA: NEGATIVE
SPEC GRAV UA: 1.01 (ref 1.010–1.025)
UROBILINOGEN UA: 0.2 U/dL

## 2017-10-24 NOTE — Progress Notes (Signed)
Pt is here for an ROB visit. 

## 2017-10-24 NOTE — Progress Notes (Signed)
ROB, doing well. She complains of a tender area that stings when she urinates. On exam no lesions or abrasions noted. Discussed increase vascularity and sensitivity due to pregnancy. 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 Need to visually exam to make sure not a break out. She agrees to plan . She is currently taking the suppression therapy once a day as ordered. SVE 2-3/80/-2. Reviewed labor precautions. Follow up 1 wk.   Philip Aspen, CNM

## 2017-10-24 NOTE — Telephone Encounter (Signed)
Reassured pt this is normal but if bleeding becomes heavier to let us know. Pt was in agreement.

## 2017-10-24 NOTE — Patient Instructions (Signed)
Braxton Hicks Contractions °Contractions of the uterus can occur throughout pregnancy, but they are not always a sign that you are in labor. You may have practice contractions called Braxton Hicks contractions. These false labor contractions are sometimes confused with true labor. °What are Braxton Hicks contractions? °Braxton Hicks contractions are tightening movements that occur in the muscles of the uterus before labor. Unlike true labor contractions, these contractions do not result in opening (dilation) and thinning of the cervix. Toward the end of pregnancy (32-34 weeks), Braxton Hicks contractions can happen more often and may become stronger. These contractions are sometimes difficult to tell apart from true labor because they can be very uncomfortable. You should not feel embarrassed if you go to the hospital with false labor. °Sometimes, the only way to tell if you are in true labor is for your health care provider to look for changes in the cervix. The health care provider will do a physical exam and may monitor your contractions. If you are not in true labor, the exam should show that your cervix is not dilating and your water has not broken. °If there are other health problems associated with your pregnancy, it is completely safe for you to be sent home with false labor. You may continue to have Braxton Hicks contractions until you go into true labor. °How to tell the difference between true labor and false labor °True labor °· Contractions last 30-70 seconds. °· Contractions become very regular. °· Discomfort is usually felt in the top of the uterus, and it spreads to the lower abdomen and low back. °· Contractions do not go away with walking. °· Contractions usually become more intense and increase in frequency. °· The cervix dilates and gets thinner. °False labor °· Contractions are usually shorter and not as strong as true labor contractions. °· Contractions are usually irregular. °· Contractions  are often felt in the front of the lower abdomen and in the groin. °· Contractions may go away when you walk around or change positions while lying down. °· Contractions get weaker and are shorter-lasting as time goes on. °· The cervix usually does not dilate or become thin. °Follow these instructions at home: °· Take over-the-counter and prescription medicines only as told by your health care provider. °· Keep up with your usual exercises and follow other instructions from your health care provider. °· Eat and drink lightly if you think you are going into labor. °· If Braxton Hicks contractions are making you uncomfortable: °? Change your position from lying down or resting to walking, or change from walking to resting. °? Sit and rest in a tub of warm water. °? Drink enough fluid to keep your urine pale yellow. Dehydration may cause these contractions. °? Do slow and deep breathing several times an hour. °· Keep all follow-up prenatal visits as told by your health care provider. This is important. °Contact a health care provider if: °· You have a fever. °· You have continuous pain in your abdomen. °Get help right away if: °· Your contractions become stronger, more regular, and closer together. °· You have fluid leaking or gushing from your vagina. °· You pass blood-tinged mucus (bloody show). °· You have bleeding from your vagina. °· You have low back pain that you never had before. °· You feel your baby’s head pushing down and causing pelvic pressure. °· Your baby is not moving inside you as much as it used to. °Summary °· Contractions that occur before labor are called Braxton   Hicks contractions, false labor, or practice contractions. °· Braxton Hicks contractions are usually shorter, weaker, farther apart, and less regular than true labor contractions. True labor contractions usually become progressively stronger and regular and they become more frequent. °· Manage discomfort from Braxton Hicks contractions by  changing position, resting in a warm bath, drinking plenty of water, or practicing deep breathing. °This information is not intended to replace advice given to you by your health care provider. Make sure you discuss any questions you have with your health care provider. °Document Released: 01/19/2017 Document Revised: 01/19/2017 Document Reviewed: 01/19/2017 °Elsevier Interactive Patient Education © 2018 [employer] ° °

## 2017-10-24 NOTE — Telephone Encounter (Signed)
The patient called and stated that she got her cervix checked today and she is now spotting, The patient wants to speak with nurse if possible to confirm that it is normal. No other information was disclosed. Please advise.

## 2017-10-25 ENCOUNTER — Encounter: Payer: Self-pay | Admitting: Certified Nurse Midwife

## 2017-10-31 ENCOUNTER — Ambulatory Visit (INDEPENDENT_AMBULATORY_CARE_PROVIDER_SITE_OTHER): Payer: Managed Care, Other (non HMO) | Admitting: Certified Nurse Midwife

## 2017-10-31 VITALS — BP 135/75 | HR 99 | Wt 189.0 lb

## 2017-10-31 DIAGNOSIS — O480 Post-term pregnancy: Secondary | ICD-10-CM

## 2017-10-31 DIAGNOSIS — O1203 Gestational edema, third trimester: Secondary | ICD-10-CM

## 2017-10-31 DIAGNOSIS — Z3493 Encounter for supervision of normal pregnancy, unspecified, third trimester: Secondary | ICD-10-CM

## 2017-10-31 LAB — POCT URINALYSIS DIPSTICK
Bilirubin, UA: NEGATIVE
Blood, UA: NEGATIVE
GLUCOSE UA: NEGATIVE
Ketones, UA: NEGATIVE
LEUKOCYTES UA: NEGATIVE
NITRITE UA: NEGATIVE
PROTEIN UA: NEGATIVE
Spec Grav, UA: <=1.005 — AB (ref 1.010–1.025)
Urobilinogen, UA: 0.2 E.U./dL
pH, UA: 6.5 (ref 5.0–8.0)

## 2017-10-31 NOTE — Progress Notes (Signed)
Pt is here for an [place] visit. Is c/o swelling in feet.

## 2017-10-31 NOTE — Progress Notes (Signed)
ROB-Reports lower extremity swelling. Denies HA, blurred vision, spots before eyes, and epigastric pain. Requests SVE. Anticipatory guidance regarding postdates care. Reviewed red flag symptoms and when to call. RTC x Friday for Growth US/BPP and ROB or sooner if needed.

## 2017-10-31 NOTE — Patient Instructions (Addendum)
Fetal Movement Counts Patient Name: ________________________________________________ Patient Due Date: ____________________ What is a fetal movement count? A fetal movement count is the number of times that you feel your baby move during a certain amount of time. This may also be called a fetal kick count. A fetal movement count is recommended for every pregnant woman. You may be asked to start counting fetal movements as early as week 28 of your pregnancy. Pay attention to when your baby is most active. You may notice your baby's sleep and wake cycles. You may also notice things that make your baby move more. You should do a fetal movement count:  When your baby is normally most active.  At the same time each day.  A good time to count movements is while you are resting, after having something to eat and drink. How do I count fetal movements? 1. Find a quiet, comfortable area. Sit, or lie down on your side. 2. Write down the date, the start time and stop time, and the number of movements that you felt between those two times. Take this information with you to your health care visits. 3. For 2 hours, count kicks, flutters, swishes, rolls, and jabs. You should feel at least 10 movements during 2 hours. 4. You may stop counting after you have felt 10 movements. 5. If you do not feel 10 movements in 2 hours, have something to eat and drink. Then, keep resting and counting for 1 hour. If you feel at least 4 movements during that hour, you may stop counting. Contact a health care provider if:  You feel fewer than 4 movements in 2 hours.  Your baby is not moving like he or she usually does. Date: ____________ Start time: ____________ Stop time: ____________ Movements: ____________ Date: ____________ Start time: ____________ Stop time: ____________ Movements: ____________ Date: ____________ Start time: ____________ Stop time: ____________ Movements: ____________ Date: ____________ Start time:  ____________ Stop time: ____________ Movements: ____________ Date: ____________ Start time: ____________ Stop time: ____________ Movements: ____________ Date: ____________ Start time: ____________ Stop time: ____________ Movements: ____________ Date: ____________ Start time: ____________ Stop time: ____________ Movements: ____________ Date: ____________ Start time: ____________ Stop time: ____________ Movements: ____________ Date: ____________ Start time: ____________ Stop time: ____________ Movements: ____________ This information is not intended to replace advice given to you by your health care provider. Make sure you discuss any questions you have with your health care provider. Document Released: 10/05/2006 Document Revised: 05/04/2016 Document Reviewed: 10/15/2015 Elsevier Interactive Patient Education  2018 [employer]. Common Medications Safe in Pregnancy  Acne:      Constipation:  Benzoyl Peroxide     Colace  Clindamycin      Dulcolax Suppository  Topica Erythromycin     Fibercon  Salicylic Acid      Metamucil         Miralax AVOID:        Senakot   Accutane    Cough:  Retin-A       Cough Drops  Tetracycline      Phenergan w/ Codeine if Rx  Minocycline      Robitussin (Plain & DM)  Antibiotics:     Crabs/Lice:  Ceclor       RID  Cephalosporins    AVOID:  E-Mycins      Kwell  Keflex  Macrobid/Macrodantin   Diarrhea:  Penicillin      Kao-Pectate  Zithromax      Imodium AD         PUSH FLUIDS  AVOID:       Cipro     Fever:  Tetracycline      Tylenol (Regular or Extra  Minocycline       Strength)  Levaquin      Extra Strength-Do not          Exceed 8 tabs/24 hrs Caffeine:        <21m/day (equiv. To 1 cup of coffee or  approx. 3 12 oz sodas)         Gas: Cold/Hayfever:       Gas-X  Benadryl      Mylicon  Claritin       Phazyme  **Claritin-D        Chlor-Trimeton    Headaches:  Dimetapp      ASA-Free Excedrin  Drixoral-Non-Drowsy     Cold Compress  Mucinex  (Guaifenasin)     Tylenol (Regular or Extra  Sudafed/Sudafed-12 Hour     Strength)  **Sudafed PE Pseudoephedrine   Tylenol Cold & Sinus     Vicks Vapor Rub  Zyrtec  **AVOID if Problems With Blood Pressure         Heartburn: Avoid lying down for at least 1 hour after meals  Aciphex      Maalox     Rash:  Milk of Magnesia     Benadryl    Mylanta       1% Hydrocortisone Cream  Pepcid  Pepcid Complete   Sleep Aids:  Prevacid      Ambien   Prilosec       Benadryl  Rolaids       Chamomile Tea  Tums (Limit 4/day)     Unisom  Zantac       Tylenol PM         Warm milk-add vanilla or  Hemorrhoids:       Sugar for taste  Anusol/Anusol H.C.  (RX: Analapram 2.5%)  Sugar Substitutes:  Hydrocortisone OTC     Ok in moderation  Preparation H      Tucks        Vaseline lotion applied to tissue with wiping    Herpes:     Throat:  Acyclovir      Oragel  Famvir  Valtrex     Vaccines:         Flu Shot Leg Cramps:       *Gardasil  Benadryl      Hepatitis A         Hepatitis B Nasal Spray:       Pneumovax  Saline Nasal Spray     Polio Booster         Tetanus Nausea:       Tuberculosis test or PPD  Vitamin B6 25 mg TID   AVOID:    Dramamine      *Gardasil  Emetrol       Live Poliovirus  Ginger Root 250 mg QID    MMR (measles, mumps &  High Complex Carbs @ Bedtime    rebella)  Sea Bands-Accupressure    Varicella (Chickenpox)  Unisom 1/2 tab TID     *No known complications           If received before Pain:         Known pregnancy;   Darvocet       Resume series after  Lortab        Delivery  Percocet    Yeast:   Tramadol  Femstat  Tylenol 3      Gyne-lotrimin  Ultram       Monistat  Vicodin           MISC:         All Sunscreens           Hair Coloring/highlights          Insect Repellant's          (Including DEET)         Mystic Tans  

## 2017-11-01 IMAGING — US US SOFT TISSUE HEAD/NECK
1 series · 14 of 16 positions shown · non-contrast
Comparison: None.

CLINICAL DATA: Cervical adenopathy

EXAM:
ULTRASOUND OF HEAD/NECK SOFT TISSUES
TECHNIQUE: Ultrasound examination of the head and neck soft tissues was
performed in the area of clinical concern.

[Series 1: us soft tissue head/neck · 0.07mm/px · 14 of 16 slices shown]
[im 1/16]
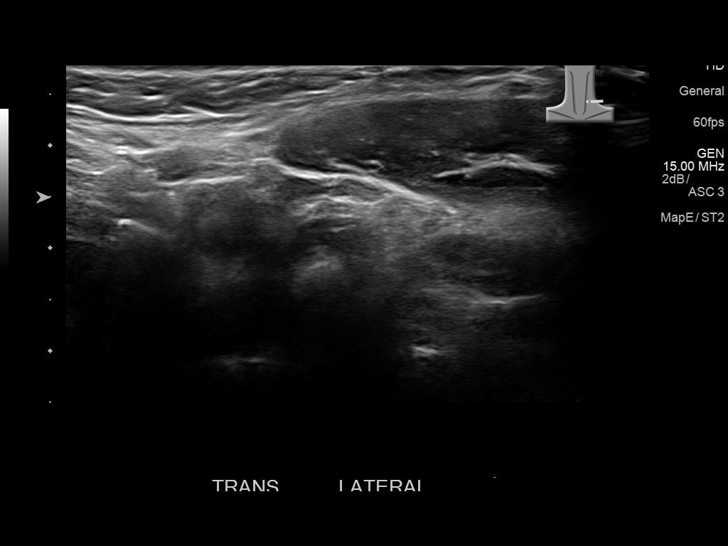
[im 2/16]
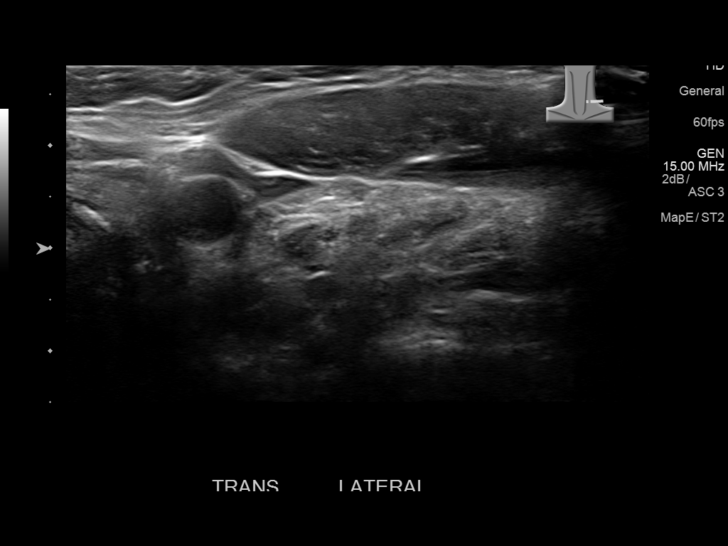
[im 3/16]
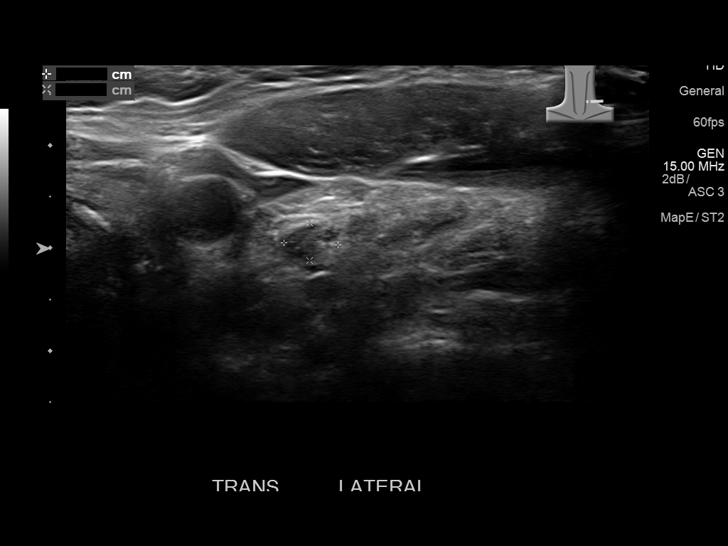
[im 5/16]
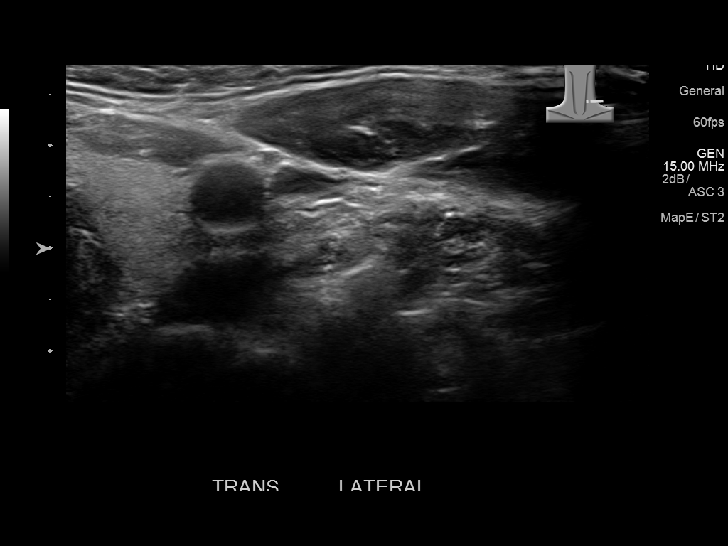
[im 6/16]
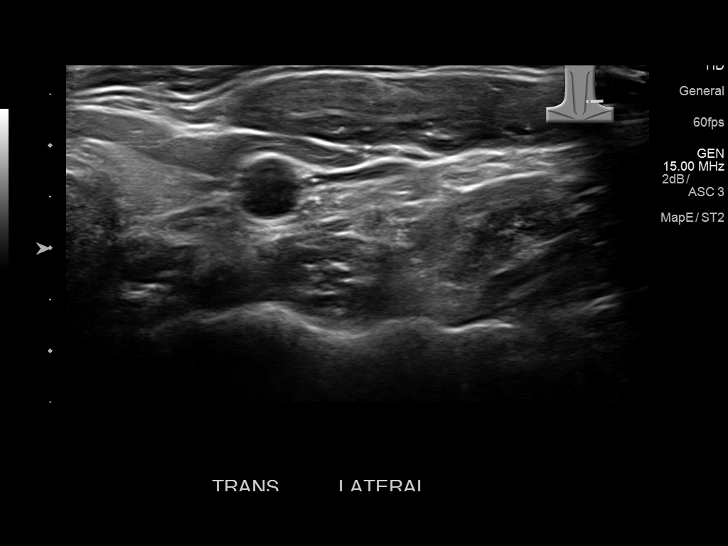
[im 7/16]
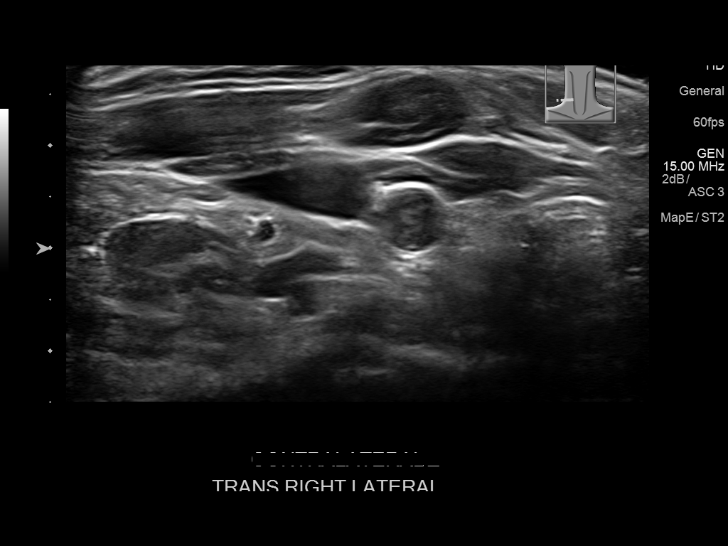
[im 8/16]
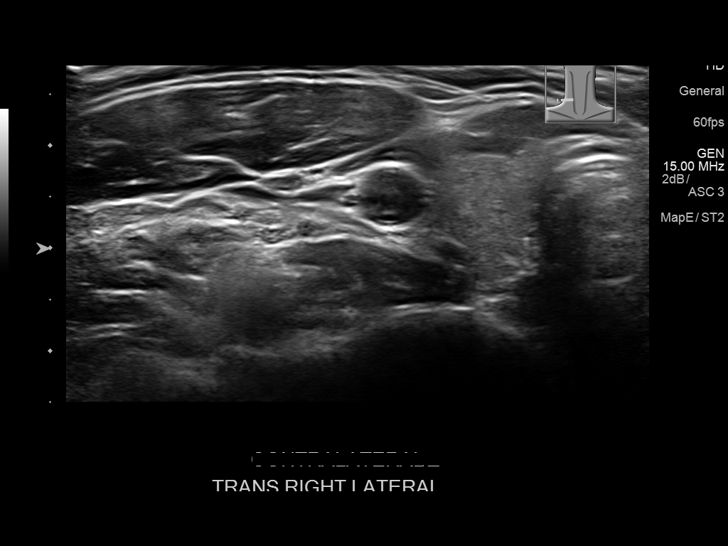
[im 9/16]
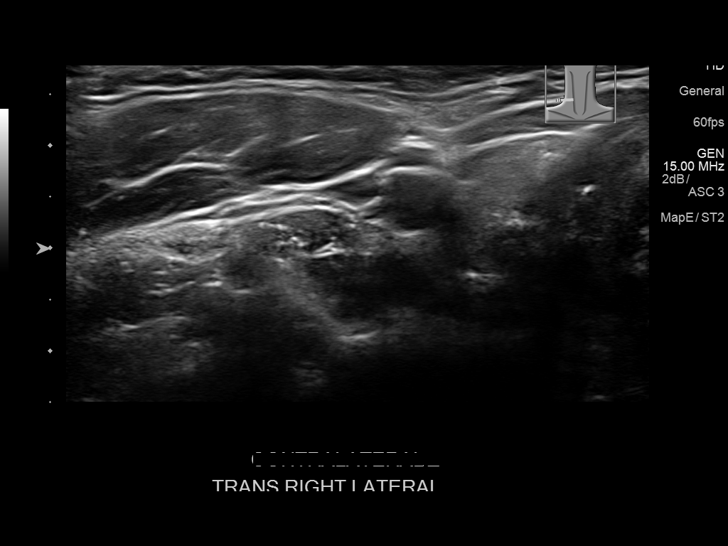
[im 10/16]
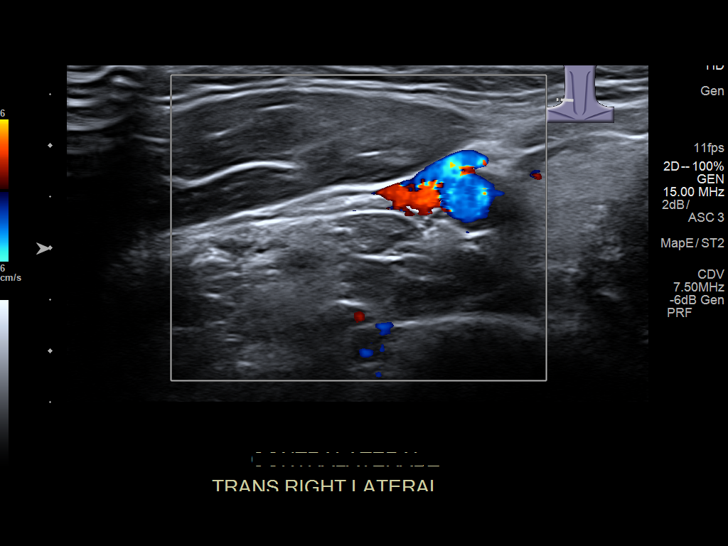
[im 11/16]
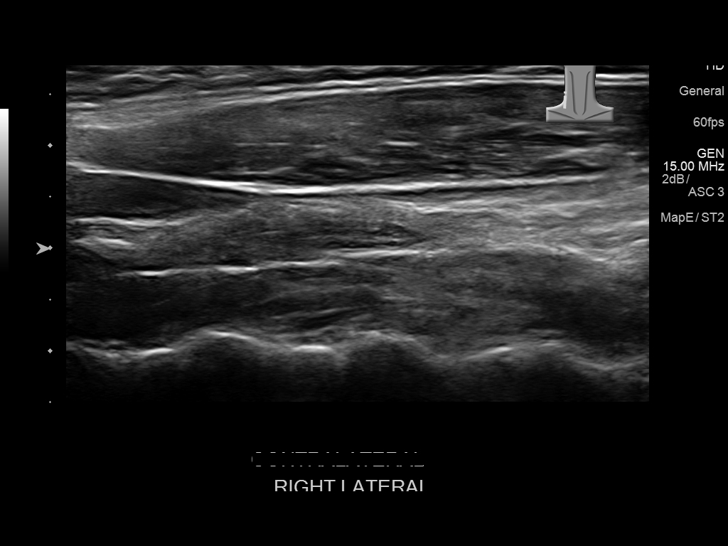
[im 13/16]
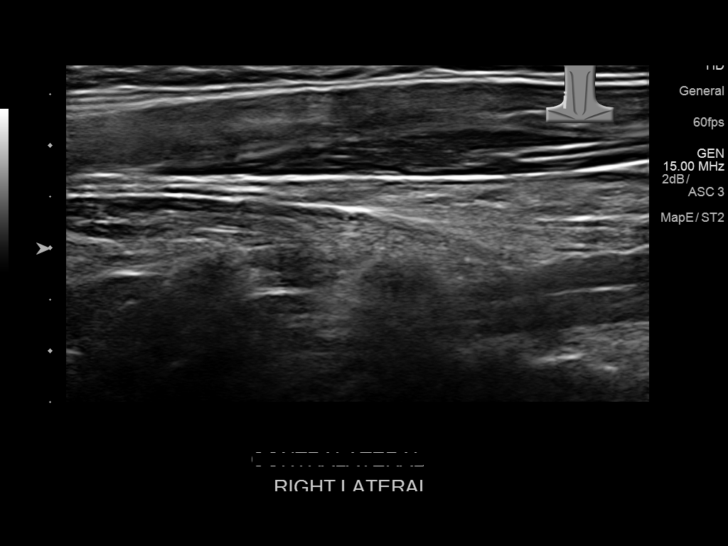
[im 14/16]
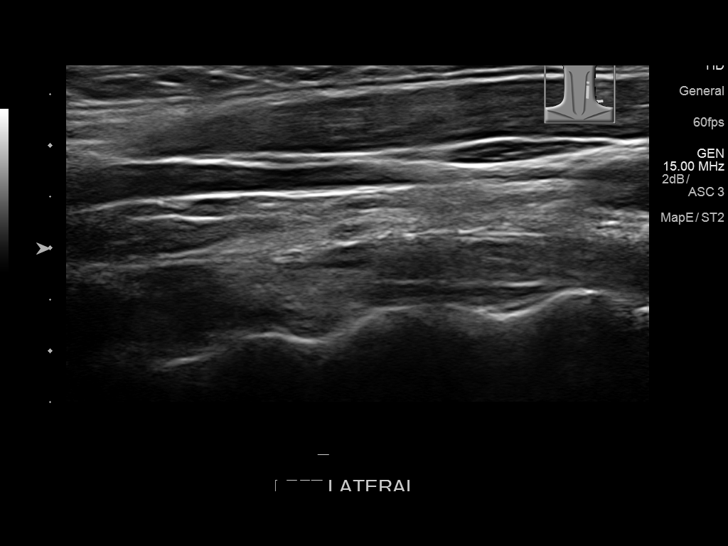
[im 15/16]
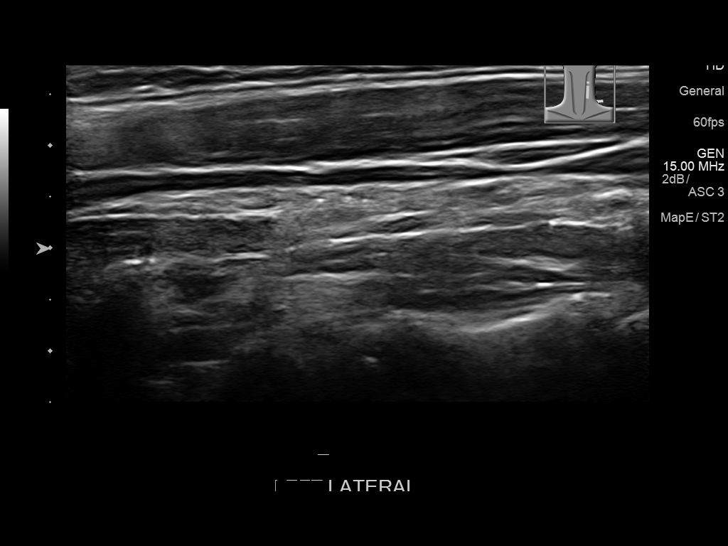
[im 16/16]
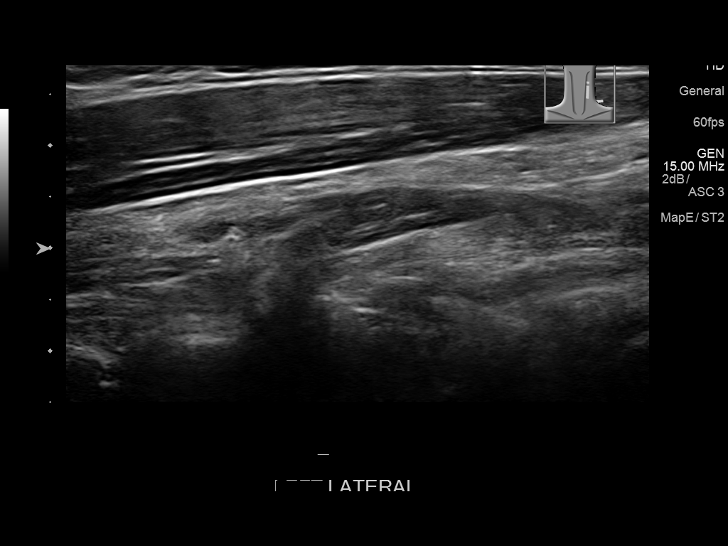

[14 of 16 positions shown; findings below may reference images not displayed]

FINDINGS: Sonographic evaluation of the right and left neck demonstrates sub
cm short axis diameter small bilateral lymph nodes.
IMPRESSION: There is no evidence of abnormal adenopathy based are short axis
diameter measurement criteria.

## 2017-11-03 ENCOUNTER — Ambulatory Visit (INDEPENDENT_AMBULATORY_CARE_PROVIDER_SITE_OTHER): Payer: Managed Care, Other (non HMO) | Admitting: Certified Nurse Midwife

## 2017-11-03 ENCOUNTER — Ambulatory Visit (INDEPENDENT_AMBULATORY_CARE_PROVIDER_SITE_OTHER): Payer: Managed Care, Other (non HMO)

## 2017-11-03 VITALS — BP 111/63 | HR 97 | Wt 189.6 lb

## 2017-11-03 DIAGNOSIS — Z3483 Encounter for supervision of other normal pregnancy, third trimester: Secondary | ICD-10-CM

## 2017-11-03 DIAGNOSIS — O480 Post-term pregnancy: Secondary | ICD-10-CM

## 2017-11-03 DIAGNOSIS — Z3493 Encounter for supervision of normal pregnancy, unspecified, third trimester: Secondary | ICD-10-CM | POA: Diagnosis not present

## 2017-11-03 LAB — POCT URINALYSIS DIPSTICK
Bilirubin, UA: NEGATIVE
Glucose, UA: NEGATIVE
Ketones, UA: NEGATIVE
LEUKOCYTES UA: NEGATIVE
NITRITE UA: NEGATIVE
PH UA: 7 (ref 5.0–8.0)
Protein, UA: NEGATIVE
RBC UA: NEGATIVE
Spec Grav, UA: 1.02 (ref 1.010–1.025)
UROBILINOGEN UA: 0.2 U/dL

## 2017-11-03 NOTE — Progress Notes (Signed)
BPP/ROB-BBP today: 8/8. Discussed labor preparation techniques and home treatment measures. Will schedule induction of labor for later next week. RTC x Monday or Tuesday for ROB and NST. Reviewed red flag symptoms and when to call. Induction of labor scheduled for Friday, 11/10/2017 at 0500. Date and time sent to patient via [place].   ULTRASOUND REPORT  Location: [facility] Date of Service:  11/03/2017  Indications: BPP & Growth for Post-Dates Findings:  Singleton intrauterine pregnancy is visualized with FHR at 133 BPM. Biometrics give an (U/S) Gestational age of [redacted] weeks and an (U/S) EDD of 11/03/17; this correlates with the clinically established EDD of 11/02/17.  Fetal presentation is vertex.  EFW: 4080 grams (9lb 0oz).  88th percentile.  AC measures 90th percentile.   The EFW is probably underestimated due to the inability to obtain an accurate BPD/HC measurement due to fetal position.  Fetal head is positioned extremely low. Placenta: Posterior and grade 2. AFI: 7.5 cm.  BPP: 8/8 with good visualization of fetal movement, tone, breathing, and fluid.  Fetal stomach, kidneys, and bladder appear WNL.  Impression: 1. 40 week Viable Singleton Intrauterine pregnancy by U/S. 2. (U/S) EDD is consistent with Clinically established (LMP) EDD of 11/02/17. 3. EFW: 4080 grams (9lb 0oz).  88th percentile.  AC measures 90th percentile.  4. EFW is probably underestimated due to inability to obtain an accurate BPD/HC measurement due to fetal position. 5. BPP: 8/8  Recommendations: 1.Clinical correlation with the patient's History and Physical Exam.

## 2017-11-03 NOTE — Progress Notes (Signed)
Pt wants to talk about preg delivery plans.

## 2017-11-03 NOTE — Patient Instructions (Signed)
Labor Induction Labor induction is when steps are taken to cause a pregnant woman to begin the labor process. Most women go into labor on their own between 37 weeks and 42 weeks of the pregnancy. When this does not happen or when there is a medical need, methods may be used to induce labor. Labor induction causes a pregnant woman's uterus to contract. It also causes the cervix to soften (ripen), open (dilate), and thin out (efface). Usually, labor is not induced before 39 weeks of the pregnancy unless there is a problem with the baby or mother. Before inducing labor, your health care provider will consider a number of factors, including the following:  The medical condition of you and the baby.  How many weeks along you are.  The status of the baby's lung maturity.  The condition of the cervix.  The position of the baby. What are the reasons for labor induction? Labor may be induced for the following reasons:  The health of the baby or mother is at risk.  The pregnancy is overdue by 1 week or more.  The water breaks but labor does not start on its own.  The mother has a health condition or serious illness, such as high blood pressure, infection, placental abruption, or diabetes.  The amniotic fluid amounts are low around the baby.  The baby is distressed. Convenience or wanting the baby to be born on a certain date is not a reason for inducing labor. What methods are used for labor induction? Several methods of labor induction may be used, such as:  Prostaglandin medicine. This medicine causes the cervix to dilate and ripen. The medicine will also start contractions. It can be taken by mouth or by inserting a suppository into the vagina.  Inserting a thin tube (catheter) with a balloon on the end into the vagina to dilate the cervix. Once inserted, the balloon is expanded with water, which causes the cervix to open.  Stripping the membranes. Your health care provider separates  amniotic sac tissue from the cervix, causing the cervix to be stretched and causing the release of a hormone called progesterone. This may cause the uterus to contract. It is often done during an office visit. You will be sent home to wait for the contractions to begin. You will then come in for an induction.  Breaking the water. Your health care provider makes a hole in the amniotic sac using a small instrument. Once the amniotic sac breaks, contractions should begin. This may still take hours to see an effect.  Medicine to trigger or strengthen contractions. This medicine is given through an IV access tube inserted into a vein in your arm. All of the methods of induction, besides stripping the membranes, will be done in the hospital. Induction is done in the hospital so that you and the baby can be carefully monitored. How long does it take for labor to be induced? Some inductions can take up to 2-3 days. Depending on the cervix, it usually takes less time. It takes longer when you are induced early in the pregnancy or if this is your first pregnancy. If a mother is still pregnant and the induction has been going on for 2-3 days, either the mother will be sent home or a cesarean delivery will be needed. What are the risks associated with labor induction? Some of the risks of induction include:  Changes in fetal heart rate, such as too high, too low, or erratic.  Fetal distress.    Chance of infection for the mother and baby.  Increased chance of having a cesarean delivery.  Breaking off (abruption) of the placenta from the uterus (rare).  Uterine rupture (very rare). When induction is needed for medical reasons, the benefits of induction may outweigh the risks. What are some reasons for not inducing labor? Labor induction should not be done if:  It is shown that your baby does not tolerate labor.  You have had previous surgeries on your uterus, such as a myomectomy or the removal of  fibroids.  Your placenta lies very low in the uterus and blocks the opening of the cervix (placenta previa).  Your baby is not in a head-down position.  The umbilical cord drops down into the birth canal in front of the baby. This could cut off the baby's blood and oxygen supply.  You have had a previous cesarean delivery.  There are unusual circumstances, such as the baby being extremely premature. This information is not intended to replace advice given to you by your health care provider. Make sure you discuss any questions you have with your health care provider. Document Released: 01/25/2007 Document Revised: 02/11/2016 Document Reviewed: 04/04/2013 Elsevier Interactive Patient Education  2017 [employer]  

## 2017-11-04 ENCOUNTER — Encounter: Payer: Self-pay | Admitting: Certified Nurse Midwife

## 2017-11-06 ENCOUNTER — Encounter: Payer: Managed Care, Other (non HMO) | Admitting: Certified Nurse Midwife

## 2017-11-06 ENCOUNTER — Inpatient Hospital Stay
Admission: EM | Admit: 2017-11-06 | Discharge: 2017-11-08 | DRG: 806 | Disposition: A | Discharge status: Home / Self Care | Payer: Managed Care, Other (non HMO) | Attending: Obstetrics and Gynecology | Admitting: Obstetrics and Gynecology

## 2017-11-06 ENCOUNTER — Other Ambulatory Visit: Payer: Managed Care, Other (non HMO)

## 2017-11-06 ENCOUNTER — Inpatient Hospital Stay: Payer: Managed Care, Other (non HMO) | Admitting: Anesthesiology

## 2017-11-06 ENCOUNTER — Other Ambulatory Visit: Payer: Self-pay

## 2017-11-06 DIAGNOSIS — O9832 Other infections with a predominantly sexual mode of transmission complicating childbirth: Secondary | ICD-10-CM | POA: Diagnosis present

## 2017-11-06 DIAGNOSIS — Z3A40 40 weeks gestation of pregnancy: Secondary | ICD-10-CM

## 2017-11-06 DIAGNOSIS — R319 Hematuria, unspecified: Secondary | ICD-10-CM

## 2017-11-06 DIAGNOSIS — Z3490 Encounter for supervision of normal pregnancy, unspecified, unspecified trimester: Secondary | ICD-10-CM

## 2017-11-06 DIAGNOSIS — A6000 Herpesviral infection of urogenital system, unspecified: Secondary | ICD-10-CM | POA: Diagnosis present

## 2017-11-06 DIAGNOSIS — Z3483 Encounter for supervision of other normal pregnancy, third trimester: Secondary | ICD-10-CM | POA: Diagnosis present

## 2017-11-06 DIAGNOSIS — N390 Urinary tract infection, site not specified: Secondary | ICD-10-CM

## 2017-11-06 LAB — TYPE AND SCREEN
ABO/RH(D): A POS
Antibody Screen: NEGATIVE

## 2017-11-06 LAB — CBC
HEMATOCRIT: 37.1 % (ref 35.0–47.0)
HEMOGLOBIN: 12.8 g/dL (ref 12.0–16.0)
MCH: 32.4 pg (ref 26.0–34.0)
MCHC: 34.5 g/dL (ref 32.0–36.0)
MCV: 94.1 fL (ref 80.0–100.0)
Platelets: 161 10*3/uL (ref 150–440)
RBC: 3.94 MIL/uL (ref 3.80–5.20)
RDW: 13.8 % (ref 11.5–14.5)
WBC: 11.7 10*3/uL — AB (ref 3.6–11.0)

## 2017-11-06 MED ORDER — DIBUCAINE 1 % RE OINT: 1.0000 "application " | TOPICAL_OINTMENT | RECTAL | Status: DC | PRN

## 2017-11-06 MED ORDER — COCONUT OIL OIL: 1.0000 "application " | TOPICAL_OIL | Status: DC | PRN

## 2017-11-06 MED ORDER — OXYTOCIN 40 UNITS IN LACTATED RINGERS INFUSION - SIMPLE MED
1.0000 m[IU]/min | INTRAVENOUS | Status: DC
  Administered 2017-11-06: 2 m[IU]/min via INTRAVENOUS

## 2017-11-06 MED ORDER — METHYLERGONOVINE MALEATE 0.2 MG/ML IJ SOLN: 0.2000 mg | INTRAMUSCULAR | Status: DC | PRN

## 2017-11-06 MED ORDER — OXYTOCIN 40 UNITS IN LACTATED RINGERS INFUSION - SIMPLE MED
2.5000 [IU]/h | INTRAVENOUS | Status: DC
  Administered 2017-11-06: 2.5 [IU]/h via INTRAVENOUS
  Filled 2017-11-06: qty 1000

## 2017-11-06 MED ORDER — ONDANSETRON HCL 4 MG/2ML IJ SOLN
4.0000 mg | INTRAMUSCULAR | Status: DC | PRN
Start: 2017-11-06 — End: 2017-11-08

## 2017-11-06 MED ORDER — SOD CITRATE-CITRIC ACID 500-334 MG/5ML PO SOLN: 30.0000 mL | ORAL | Status: DC | PRN

## 2017-11-06 MED ORDER — EPHEDRINE 5 MG/ML INJ
10.0000 mg | INTRAVENOUS | Status: DC | PRN
  Filled 2017-11-06: qty 2

## 2017-11-06 MED ORDER — OXYTOCIN 10 UNIT/ML IJ SOLN
INTRAMUSCULAR | Status: AC
  Filled 2017-11-06: qty 2

## 2017-11-06 MED ORDER — OXYCODONE-ACETAMINOPHEN 5-325 MG PO TABS: 2.0000 | ORAL_TABLET | ORAL | Status: DC | PRN

## 2017-11-06 MED ORDER — FENTANYL 2.5 MCG/ML W/ROPIVACAINE 0.15% IN NS 100 ML EPIDURAL (ARMC)
EPIDURAL | Status: DC | PRN
  Administered 2017-11-06: 12 mL/h via EPIDURAL

## 2017-11-06 MED ORDER — ONDANSETRON HCL 4 MG/2ML IJ SOLN: 4.0000 mg | Freq: Four times a day (QID) | INTRAMUSCULAR | Status: DC | PRN

## 2017-11-06 MED ORDER — SENNOSIDES-DOCUSATE SODIUM 8.6-50 MG PO TABS: 2.0000 | ORAL_TABLET | ORAL | Status: DC

## 2017-11-06 MED ORDER — PRENATAL MULTIVITAMIN CH
1.0000 | ORAL_TABLET | Freq: Every day | ORAL | Status: DC
  Administered 2017-11-07 – 2017-11-08 (×2): 1 via ORAL
  Filled 2017-11-06 (×2): qty 1

## 2017-11-06 MED ORDER — BUTORPHANOL TARTRATE 1 MG/ML IJ SOLN
1.0000 mg | INTRAMUSCULAR | Status: DC | PRN
  Administered 2017-11-06: 1 mg via INTRAVENOUS
  Filled 2017-11-06: qty 1

## 2017-11-06 MED ORDER — SODIUM CHLORIDE 0.9 % IV SOLN: INTRAVENOUS | Status: DC

## 2017-11-06 MED ORDER — LACTATED RINGERS IV SOLN
500.0000 mL | Freq: Once | INTRAVENOUS | Status: AC
  Administered 2017-11-06: 500 mL via INTRAVENOUS

## 2017-11-06 MED ORDER — LIDOCAINE HCL (PF) 1 % IJ SOLN
INTRAMUSCULAR | Status: AC
  Filled 2017-11-06: qty 30

## 2017-11-06 MED ORDER — FERROUS SULFATE 325 (65 FE) MG PO TABS
325.0000 mg | ORAL_TABLET | Freq: Every day | ORAL | Status: DC
  Administered 2017-11-07 – 2017-11-08 (×2): 325 mg via ORAL
  Filled 2017-11-06 (×2): qty 1

## 2017-11-06 MED ORDER — FENTANYL 2.5 MCG/ML W/ROPIVACAINE 0.15% IN NS 100 ML EPIDURAL (ARMC)
12.0000 mL/h | EPIDURAL | Status: DC
  Filled 2017-11-06: qty 100

## 2017-11-06 MED ORDER — OXYTOCIN 10 UNIT/ML IJ SOLN: 10.0000 [IU] | Freq: Once | INTRAMUSCULAR | Status: DC

## 2017-11-06 MED ORDER — TETANUS-DIPHTH-ACELL PERTUSSIS 5-2.5-18.5 LF-MCG/0.5 IM SUSP: 0.5000 mL | Freq: Once | INTRAMUSCULAR | Status: DC

## 2017-11-06 MED ORDER — SIMETHICONE 80 MG PO CHEW: 80.0000 mg | CHEWABLE_TABLET | ORAL | Status: DC | PRN

## 2017-11-06 MED ORDER — DOCUSATE SODIUM 100 MG PO CAPS
100.0000 mg | ORAL_CAPSULE | Freq: Two times a day (BID) | ORAL | Status: DC
  Administered 2017-11-06 – 2017-11-08 (×4): 100 mg via ORAL
  Filled 2017-11-06 (×5): qty 1

## 2017-11-06 MED ORDER — PHENYLEPHRINE 40 MCG/ML (10ML) SYRINGE FOR IV PUSH (FOR BLOOD PRESSURE SUPPORT)
80.0000 ug | PREFILLED_SYRINGE | INTRAVENOUS | Status: DC | PRN
  Filled 2017-11-06: qty 5

## 2017-11-06 MED ORDER — IBUPROFEN 600 MG PO TABS
600.0000 mg | ORAL_TABLET | Freq: Four times a day (QID) | ORAL | Status: DC
  Administered 2017-11-06 – 2017-11-08 (×8): 600 mg via ORAL
  Filled 2017-11-06 (×8): qty 1

## 2017-11-06 MED ORDER — METHYLERGONOVINE MALEATE 0.2 MG PO TABS: 0.2000 mg | ORAL_TABLET | ORAL | Status: DC | PRN

## 2017-11-06 MED ORDER — LIDOCAINE HCL (PF) 1 % IJ SOLN: 30.0000 mL | INTRAMUSCULAR | Status: DC | PRN

## 2017-11-06 MED ORDER — MISOPROSTOL 200 MCG PO TABS
ORAL_TABLET | ORAL | Status: AC
  Filled 2017-11-06: qty 4

## 2017-11-06 MED ORDER — WITCH HAZEL-GLYCERIN EX PADS: 1.0000 "application " | MEDICATED_PAD | CUTANEOUS | Status: DC | PRN

## 2017-11-06 MED ORDER — LACTATED RINGERS IV SOLN
500.0000 mL | INTRAVENOUS | Status: DC | PRN
  Administered 2017-11-06: 1000 mL via INTRAVENOUS

## 2017-11-06 MED ORDER — DIPHENHYDRAMINE HCL 50 MG/ML IJ SOLN: 12.5000 mg | INTRAMUSCULAR | Status: DC | PRN

## 2017-11-06 MED ORDER — BENZOCAINE-MENTHOL 20-0.5 % EX AERO: 1.0000 "application " | INHALATION_SPRAY | CUTANEOUS | Status: DC | PRN

## 2017-11-06 MED ORDER — TERBUTALINE SULFATE 1 MG/ML IJ SOLN
0.2500 mg | Freq: Once | INTRAMUSCULAR | Status: DC | PRN
Start: 2017-11-06 — End: 2017-11-06

## 2017-11-06 MED ORDER — ACETAMINOPHEN 325 MG PO TABS: 650.0000 mg | ORAL_TABLET | ORAL | Status: DC | PRN

## 2017-11-06 MED ORDER — ACETAMINOPHEN 500 MG PO TABS
1000.0000 mg | ORAL_TABLET | Freq: Once | ORAL | Status: AC
  Administered 2017-11-06: 1000 mg via ORAL

## 2017-11-06 MED ORDER — BUPIVACAINE HCL (PF) 0.25 % IJ SOLN
INTRAMUSCULAR | Status: DC | PRN
  Administered 2017-11-06 (×2): 4 mL via EPIDURAL

## 2017-11-06 MED ORDER — AMMONIA AROMATIC IN INHA
RESPIRATORY_TRACT | Status: AC
  Filled 2017-11-06: qty 10

## 2017-11-06 MED ORDER — OXYCODONE-ACETAMINOPHEN 5-325 MG PO TABS: 1.0000 | ORAL_TABLET | ORAL | Status: DC | PRN

## 2017-11-06 MED ORDER — ONDANSETRON HCL 4 MG PO TABS: 4.0000 mg | ORAL_TABLET | ORAL | Status: DC | PRN

## 2017-11-06 MED ORDER — ACETAMINOPHEN 500 MG PO TABS
ORAL_TABLET | ORAL | Status: AC
  Administered 2017-11-06: 1000 mg via ORAL
  Filled 2017-11-06: qty 2

## 2017-11-06 MED ORDER — LIDOCAINE-EPINEPHRINE (PF) 1.5 %-1:200000 IJ SOLN
INTRAMUSCULAR | Status: DC | PRN
  Administered 2017-11-06: 3 mL via EPIDURAL

## 2017-11-06 MED ORDER — OXYTOCIN BOLUS FROM INFUSION
500.0000 mL | Freq: Once | INTRAVENOUS | Status: AC
  Administered 2017-11-06: 500 mL via INTRAVENOUS

## 2017-11-06 NOTE — Lactation Note (Signed)
This note was copied from a baby's chart. Lactation Consultation Note  Patient Name: Cynthia Harrell Today's Date: 11/06/2017 Reason for consult: Follow-up assessment   Maternal Data Formula Feeding for Exclusion: No Does the patient have breastfeeding experience prior to this delivery?: Yes  Feeding Feeding Type: Breast Fed Length of feed: 30 min(right breast)  LATCH Score Latch: Grasps breast easily, tongue down, lips flanged, rhythmical sucking.  Audible Swallowing: Spontaneous and intermittent  Type of Nipple: Everted at rest and after stimulation  Comfort (Breast/Nipple): Soft / non-tender  Hold (Positioning): Assistance needed to correctly position infant at breast and maintain latch.  LATCH Score: 9  Interventions Interventions: Assisted with latch  Lactation Tools Discussed/Used WIC Program: Yes   Consult Status Consult Status: PRN Date: 11/06/17 Follow-up type: In-patient    Ferol Luz 11/06/2017, 6:02 PM

## 2017-11-06 NOTE — Progress Notes (Signed)
A. Grandville Silos, CNM called back, if pt becomes more active, membrane rupture, call CNM to come see pt sooner. Order placed for admit.

## 2017-11-06 NOTE — Anesthesia Preprocedure Evaluation (Signed)
Anesthesia Evaluation  Patient identified by MRN, date of birth, ID band Patient awake    Reviewed: Allergy & Precautions, NPO status , Patient's Chart, lab work & pertinent test results, reviewed documented beta blocker date and time   Airway Mallampati: II  TM Distance: >3 FB Neck ROM: Full    Dental   Pulmonary           Cardiovascular      Neuro/Psych PSYCHIATRIC DISORDERS Anxiety    GI/Hepatic GERD  ,  Endo/Other    Renal/GU      Musculoskeletal   Abdominal   Peds  Hematology   Anesthesia Other Findings   Reproductive/Obstetrics (+) Pregnancy                             Anesthesia Physical Anesthesia Plan  ASA: II  Anesthesia Plan: Epidural   Post-op Pain Management:    Induction:   PONV Risk Score and Plan:   Airway Management Planned:   Additional Equipment:   Intra-op Plan:   Post-operative Plan:   Informed Consent: I have reviewed the patients History and Physical, chart, labs and discussed the procedure including the risks, benefits and alternatives for the proposed anesthesia with the patient or authorized representative who has indicated his/her understanding and acceptance.     Plan Discussed with:   Anesthesia Plan Comments:         Anesthesia Quick Evaluation

## 2017-11-06 NOTE — Progress Notes (Signed)
LABOR NOTE   Cynthia Harrell 32 y.o.GP@ at [redacted]w[redacted]d Active phase labor.  SUBJECTIVE:  Comfortable with epidural  OBJECTIVE:  BP 130/69   Pulse (!) 103   Temp 98 F (36.7 C) (Oral)   Resp (!) 22   Ht 5\' 6"  (1.676 m)   Wt 189 lb (85.7 kg)   LMP 01/26/2017 (Approximate)   SpO2 99%   BMI 30.51 kg/m  No intake/output data recorded.  She has shown cervical change. CERVIX: 6:  100%:   -2:   mid position:   soft SVE:   Dilation: 6 Effacement (%): 100 Station: -2, -1 Exam by:: Philip Aspen, CNM CONTRACTIONS: regular, every 3-4 minutes FHR: Fetal heart tracing reviewed. Baseline: 120 bpm, Variability: Good {> 6 bpm), Accelerations: Reactive and Decelerations: Absent Category I Analgesia: Epidural  Labs: Lab Results  Component Value Date   WBC 11.7 (H) 11/06/2017   HGB 12.8 11/06/2017   HCT 37.1 11/06/2017   MCV 94.1 11/06/2017   PLT 161 11/06/2017    ASSESSMENT: 1) Labor curve reviewed.       Progress: Active phase labor.     Membranes: ruptured, bloody fluid        Active Problems:   Term pregnancy   Labor and delivery, indication for care   PLAN: IV Pitocin augmentation  Philip Aspen, CNM  11/06/2017 9:34 AM

## 2017-11-06 NOTE — Anesthesia Procedure Notes (Signed)
Epidural Patient location during procedure: OB Start time: 11/06/2017 8:46 AM End time: 11/06/2017 9:22 AM  Staffing Anesthesiologist: Emmie Niemann, MD Resident/CRNA: Demetrius Charity, CRNA Performed: resident/CRNA   Preanesthetic Checklist Completed: patient identified, site marked, surgical consent, pre-op evaluation, timeout performed, IV checked, risks and benefits discussed and monitors and equipment checked  Epidural Patient position: sitting Prep: Betadine Patient monitoring: heart rate, continuous pulse ox and blood pressure Approach: midline Location: L4-L5 Injection technique: LOR air  Needle:  Needle type: Tuohy  Needle gauge: 17 G Needle length: 9 cm and 9 Needle insertion depth: 6 cm Catheter type: closed end flexible Catheter size: 19 Gauge Catheter at skin depth: 11 cm Test dose: negative and 1.5% lidocaine with Epi 1:200 K  Assessment Sensory level: T10 Events: blood not aspirated, injection not painful, no injection resistance, negative IV test and no paresthesia  Additional Notes Pt. Evaluated and documentation done after procedure finished. Patient identified. Risks/Benefits/Options discussed with patient including but not limited to bleeding, infection, nerve damage, paralysis, failed block, incomplete pain control, headache, blood pressure changes, nausea, vomiting, reactions to medication both or allergic, itching and postpartum back pain. Confirmed with bedside nurse the patient's most recent platelet count. Confirmed with patient that they are not currently taking any anticoagulation, have any bleeding history or any family history of bleeding disorders. Patient expressed understanding and wished to proceed. All questions were answered. Sterile technique was used throughout the entire procedure. Please see nursing notes for vital signs. Test dose was given through epidural catheter and negative prior to continuing to dose epidural or start infusion.  Warning signs of high block given to the patient including shortness of breath, tingling/numbness in hands, complete motor block, or any concerning symptoms with instructions to call for help. Patient was given instructions on fall risk and not to get out of bed. All questions and concerns addressed with instructions to call with any issues or inadequate analgesia.   Patient tolerated the insertion well without immediate complications.Reason for block:procedure for pain

## 2017-11-06 NOTE — H&P (Signed)
History and Physical   HPI  Cynthia Harrell is a 32 y.o. G3P0011 at [redacted]w[redacted]d Estimated Date of Delivery: 11/02/17 who is being admitted for labor management   OB History  Obstetric History   G3   P1   T0   P0   A1   L1    SAB0   TAB1   Ectopic0   Multiple0   Live Births1     # Outcome Date GA Lbr Len/2nd Weight Sex Delivery Anes PTL Lv  3 Current           2 Para 12/04/06 [redacted]w[redacted]d  8 lb 1 oz (3.657 kg) F Vag-Spont EPI N LIV  1 TAB      TAB         PROBLEM LIST  Pregnancy complications or risks: Patient Active Problem List   Diagnosis Date Noted  . Term pregnancy 11/06/2017  . Labor and delivery, indication for care 11/06/2017  . Hx of abnormal cervical Pap smear 07/08/2015  . Generalized anxiety disorder 06/15/2015  . 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 High-risk sexual behavior     Prenatal labs and studies: ABO, Rh: --/--/A POS (02/18 4540) Antibody: NEG (02/18 0625) Rubella: 6.50 (06/29 1604) RPR: Non Reactive (06/29 1604)  HBsAg: Negative (06/29 1604)  HIV: Non Reactive (06/29 1604)  JWJ:XBJYNWGN (01/18 1136)   Past Medical History:  Diagnosis Date  . 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 High-risk sexual behavior   . Microalbuminuria      Past Surgical History:  Procedure Laterality Date  . THERAPEUTIC ABORTION  2008     Medications    Current Discharge Medication List    CONTINUE these medications which have NOT CHANGED   Details  Prenat w/o [id number] (CITRANATAL ASSURE) 35-1 & 300 MG tablet Take 1 tablet by mouth daily. Qty: 30 tablet, Refills: 11   Associated Diagnoses: Supervision of normal intrauterine pregnancy in multigravida in first trimester; Screening for STDs (sexually transmitted diseases); Screening for genitourinary condition    valACYclovir (VALTREX) 500 MG tablet TAKE 1 TABLET (500 MG TOTAL) BY MOUTH 2 (TWO) TIMES DAILY. Qty: 60 tablet, Refills: 6   Associated Diagnoses: Urinary tract infection with hematuria, site  unspecified         Allergies  Patient has no known allergies.  Review of Systems  Constitutional: negative Eyes: negative Ears, nose, mouth, throat, and face: negative Respiratory: negative Cardiovascular: negative Gastrointestinal: negative Genitourinary:negative Integument/breast: negative Hematologic/lymphatic: negative Musculoskeletal:negative Neurological: negative Behavioral/Psych: negative Endocrine: negative Allergic/Immunologic: negative  Physical Exam  BP 119/67 (BP Location: Left Arm)   Pulse 86   Temp 98 F (36.7 C) (Oral)   Resp (!) 22   Ht 5\' 6"  (1.676 m)   Wt 189 lb (85.7 kg)   LMP 01/26/2017 (Approximate)   BMI 30.51 kg/m   Lungs:  CTA B Cardio: RRR  Abd: Soft, gravid, NT Presentation: cephalic EXT: No C/C/ 1+ Edema DTRs: 2+ B CERVIX: 4.5 per RN exam   See Prenatal records for more detailed PE.     FHR:  Baseline: 120 bpm, Variability: Good {> 6 bpm), Accelerations: Reactive and Decelerations: Absent  Toco: Uterine Contractions: Frequency: Every 3-4 minutes and Duration: 40-60 seconds   Test Results  Results for orders placed or performed during the hospital encounter of 11/06/17 (from the past 24 hour(s))  Type and screen [place]     Status: None   Collection Time: 11/06/17  6:25 AM  Result Value Ref  Range   ABO/RH(D) A POS    Antibody Screen NEG    Sample Expiration      11/09/2017 Performed at [facility], [address] [place], [place] [address]   CBC     Status: Abnormal   Collection Time: 11/06/17  6:25 AM  Result Value Ref Range   WBC 11.7 (H) 3.6 - 11.0 K/uL   RBC 3.94 3.80 - 5.20 MIL/uL   Hemoglobin 12.8 12.0 - 16.0 g/dL   HCT 37.1 35.0 - 47.0 %   MCV 94.1 80.0 - 100.0 fL   MCH 32.4 26.0 - 34.0 pg   MCHC 34.5 32.0 - 36.0 g/dL   RDW 13.8 11.5 - 14.5 %   Platelets 161 150 - 440 K/uL   Group B Strep negative  Assessment   G3P0011 at [redacted]w[redacted]d Estimated Date of Delivery:  11/02/17  The fetus is reassuring.   Patient Active Problem List   Diagnosis Date Noted  . Term pregnancy 11/06/2017  . Labor and delivery, indication for care 11/06/2017  . Hx of abnormal cervical Pap smear 07/08/2015  . Generalized anxiety disorder 06/15/2015  . 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 High-risk sexual behavior     Plan  1. Admit to L&D :   Pt to get epidural then AROM 2. EFM:-- Category 1 3. Epidural if desired. Stadol for IV pain until epidural requested. 4. Admission labs completed 5. Anticipate NSVD  Philip Aspen, CNM  11/06/2017 9:12 AM

## 2017-11-06 NOTE — Progress Notes (Signed)
Plan discussed with pt, NST reactive. Pt talking and smiling and talking with family with ease.

## 2017-11-06 NOTE — Progress Notes (Signed)
LABOR NOTE   Cynthia Harrell 31 y.o.GP@ at [redacted]w[redacted]d Active phase labor.  SUBJECTIVE:  Comfortable with epidural . She feels occasional pressure OBJECTIVE:  BP 130/69   Pulse (!) 103   Temp 97.9 F (36.6 C) (Oral)   Resp (!) 22   Ht 5\' 6"  (1.676 m)   Wt 189 lb (85.7 kg)   LMP 01/26/2017 (Approximate)   SpO2 97%   BMI 30.51 kg/m  No intake/output data recorded.  She has shown cervical change. CERVIX: paper thin:  100%:   -1:   mid position:   soft SVE:   Dilation: 6.5 Effacement (%): 100 Station: -1, 0 Exam by:: [name] CONTRACTIONS: regular, every 2-3 minutes FHR: Fetal heart tracing reviewed. Baseline: 120 bpm, Variability: Good {> 6 bpm), Accelerations: Reactive and Decelerations: Early Category I   Analgesia: Epidural  Labs: Lab Results  Component Value Date   WBC 11.7 (H) 11/06/2017   HGB 12.8 11/06/2017   HCT 37.1 11/06/2017   MCV 94.1 11/06/2017   PLT 161 11/06/2017    ASSESSMENT: 1) Labor curve reviewed.       Progress: Active phase labor.     Membranes: ruptured      Active Problems:   Term pregnancy   Labor and delivery, indication for care   PLAN: continue present management  Philip Aspen, CNM  11/06/2017 11:57 AM

## 2017-11-06 NOTE — Progress Notes (Signed)
Spoke with A. Grandville Silos, CNM, made aware of repeat exam since ambulating. Will place orders to admit pt and come see her later this AM. Pt may continue to ambulate, may have IV, meds, etc.

## 2017-11-06 NOTE — OB Triage Note (Signed)
Pt arrived to BP in no acute distress, states she has been having back pain on and off and noticed pink tinged spotting since 1a, rates pain 10/10. Last seen in OB clinic this past Tues, had cervix checked, 4cm. Reports she is constantly having to pee. Denies gush of fluid. GBS neg. Says she is scheduled for OB appt this am and was supposed to have NST done.

## 2017-11-07 LAB — CBC
HCT: 30.6 % — ABNORMAL LOW (ref 35.0–47.0)
Hemoglobin: 10.4 g/dL — ABNORMAL LOW (ref 12.0–16.0)
MCH: 32.1 pg (ref 26.0–34.0)
MCHC: 34 g/dL (ref 32.0–36.0)
MCV: 94.2 fL (ref 80.0–100.0)
PLATELETS: 149 10*3/uL — AB (ref 150–440)
RBC: 3.25 MIL/uL — AB (ref 3.80–5.20)
RDW: 14 % (ref 11.5–14.5)
WBC: 14.1 10*3/uL — AB (ref 3.6–11.0)

## 2017-11-07 LAB — RPR: RPR Ser Ql: NONREACTIVE

## 2017-11-07 MED ORDER — VALACYCLOVIR HCL 500 MG PO TABS: 500.0000 mg | ORAL_TABLET | ORAL | 6 refills | Status: DC | PRN

## 2017-11-07 MED ORDER — DOCUSATE SODIUM 100 MG PO CAPS: 100.0000 mg | ORAL_CAPSULE | Freq: Every day | ORAL | 2 refills | Status: DC | PRN

## 2017-11-07 MED ORDER — IBUPROFEN 600 MG PO TABS: 600.0000 mg | ORAL_TABLET | Freq: Four times a day (QID) | ORAL | 0 refills | Status: DC

## 2017-11-07 MED ORDER — NORETHINDRONE 0.35 MG PO TABS: 1.0000 | ORAL_TABLET | Freq: Every day | ORAL | 11 refills | Status: DC

## 2017-11-07 NOTE — Discharge Summary (Signed)
@  [id number]@                            Discharge Summary  Date of Admission: 11/06/2017  Date of Discharge: 11/07/2017  Admitting Diagnosis: Onset of Labor at [redacted]w[redacted]d  Mode of Delivery: normal spontaneous vaginal delivery                 Discharge Diagnosis: No other diagnosis   Intrapartum Procedures: epidural   Post partum procedures: none  Complications: 2nd  degree perineal laceration                      Discharge Day SOAP Note:  Progress Note - Vaginal Delivery  Cynthia Harrell is a 32 y.o. G3P1012 now PP day 1 s/p Vaginal, Spontaneous . Delivery was uncomplicated  Subjective  The patient has the following complaints: has no unusual complaints  Pain is controlled with current medications.   Patient is urinating without difficulty.  She is ambulating well.    Objective  Vital signs: BP 100/65 (BP Location: Left Arm)   Pulse 96   Temp 98 F (36.7 C) (Oral)   Resp 20   Ht 5\' 6"  (1.676 m)   Wt 189 lb (85.7 kg)   LMP 01/26/2017 (Approximate)   SpO2 97%   Breastfeeding? Unknown   BMI 30.51 kg/m   Physical Exam: Gen: NAD Lungs clear Heart: RRR Bowel sounds present Fundus Fundal Tone: Firm  Lochia Amount: Small  Perineum Appearance: Edematous     Data Review Labs: CBC Latest Ref Rng & Units 11/07/2017 11/06/2017 08/04/2017  WBC 3.6 - 11.0 K/uL 14.1(H) 11.7(H) 10.7  Hemoglobin 12.0 - 16.0 g/dL 10.4(L) 12.8 11.1  Hematocrit 35.0 - 47.0 % 30.6(L) 37.1 33.9(L)  Platelets 150 - 440 K/uL 149(L) 161 204   A POS  Assessment/Plan  Active Problems:   Term pregnancy   Labor and delivery, indication for care    Plan for discharge today.   Discharge Instructions: Per After Visit Summary. Activity: Advance as tolerated. Pelvic rest for 6 weeks.  Also refer to After Visit Summary Diet: Regular Medications: Allergies as of 11/07/2017   No Known Allergies     Medication List    TAKE these medications   CITRANATAL ASSURE 35-1 & 300 MG tablet Take 1 tablet  by mouth daily.   docusate sodium 100 MG capsule Commonly known as:  COLACE Take 1 capsule (100 mg total) by mouth daily as needed.   ibuprofen 600 MG tablet Commonly known as:  ADVIL,MOTRIN Take 1 tablet (600 mg total) by mouth every 6 (six) hours.   norethindrone 0.35 MG tablet Commonly known as:  MICRONOR,CAMILA,ERRIN Take 1 tablet (0.35 mg total) by mouth daily.   valACYclovir 500 MG tablet Commonly known as:  VALTREX Take 1 tablet (500 mg total) by mouth as needed. What changed:    when to take this  reasons to take this      Outpatient follow up:  Postpartum contraception: Camilla to start at 4 wks pp.   Discharged Condition: good  Discharged to: home  Newborn Data: Disposition:home with mother  Apgars: APGAR (1 MIN): 7   APGAR (5 MINS): 9   APGAR (10 MINS):    Baby Feeding: Breast    Philip Aspen, CNM  11/07/2017 9:02 AM

## 2017-11-07 NOTE — Lactation Note (Signed)
This note was copied from a baby's chart. Lactation Consultation Note  Patient Name: Cynthia Harrell Today's Date: 11/07/2017 Reason for consult: Hyperbilirubinemia   Maternal Data    Feeding Feeding Type: Breast Fed Length of feed: 25 min  LATCH Score Latch: Grasps breast easily, tongue down, lips flanged, rhythmical sucking.  Audible Swallowing: A few with stimulation  Type of Nipple: Everted at rest and after stimulation  Comfort (Breast/Nipple): Soft / non-tender  Hold (Positioning): No assistance needed to correctly position infant at breast.  LATCH Score: 9  Interventions Interventions: Breast feeding basics reviewed  Lactation Tools Discussed/Used     Consult Status Consult Status: Follow-up Date: 11/07/17 Follow-up type: In-patient Mom has decided to f/u with formula post feeds if baby is still hungry. 10-59mL    [name] 11/07/2017, 10:41 PM

## 2017-11-07 NOTE — Lactation Note (Signed)
This note was copied from a baby's chart. Lactation Consultation Note  Patient Name: Cynthia Harrell Today's Date: 11/07/2017 Reason for consult: Hyperbilirubinemia   Maternal Data    Feeding Feeding Type: Breast Fed  LATCH Score Latch: Grasps breast easily, tongue down, lips flanged, rhythmical sucking.  Audible Swallowing: A few with stimulation  Type of Nipple: Everted at rest and after stimulation  Comfort (Breast/Nipple): Soft / non-tender  Hold (Positioning): No assistance needed to correctly position infant at breast.  LATCH Score: 9  Interventions Interventions: Breast feeding basics reviewed  Lactation Tools Discussed/Used     Consult Status Consult Status: Follow-up Date: 11/07/17 Follow-up type: In-patient Baby is currently under double lights. Mom is still breastfeeding well. Lots of consistent swallows heard at breast, with little change in wt. From 24hrs. Mom states that she would prefer to continue feeding at the breast as opposed to pumping post-feeding for supp, donor milk, or formula.    [name] 11/07/2017, 9:44 PM

## 2017-11-07 NOTE — Plan of Care (Signed)
VS stable; now up on her own; taking scheduled motrin for pain control; tolerating regular diet; breastfeeding and needs only a little assistance

## 2017-11-07 NOTE — Final Progress Note (Signed)
Discharge Day SOAP Note:  Progress Note - Vaginal Delivery  Cynthia Harrell is a 32 y.o. G3P1012 now PP day 1 s/p Vaginal, Spontaneous . Delivery was uncomplicated  Subjective  The patient has the following complaints: has no unusual complaints  Pain is controlled with current medications.   Patient is urinating without difficulty.  She is ambulating well.    Objective  Vital signs: BP 100/65 (BP Location: Left Arm)   Pulse 96   Temp 98 F (36.7 C) (Oral)   Resp 20   Ht 5\' 6"  (1.676 m)   Wt 189 lb (85.7 kg)   LMP 01/26/2017 (Approximate)   SpO2 97%   Breastfeeding? Unknown   BMI 30.51 kg/m   Physical Exam: Gen: NAD Lungs clear Heart: RRR Bowel sounds present Fundus Fundal Tone: Firm  Lochia Amount: Small  Perineum Appearance: Edematous     Data Review Labs: CBC Latest Ref Rng & Units 11/07/2017 11/06/2017 08/04/2017  WBC 3.6 - 11.0 K/uL 14.1(H) 11.7(H) 10.7  Hemoglobin 12.0 - 16.0 g/dL 10.4(L) 12.8 11.1  Hematocrit 35.0 - 47.0 % 30.6(L) 37.1 33.9(L)  Platelets 150 - 440 K/uL 149(L) 161 204   A POS  Assessment/Plan  Active Problems:   Term pregnancy   Labor and delivery, indication for care    Plan for discharge today.   Discharge Instructions: Per After Visit Summary. Activity: Advance as tolerated. Pelvic rest for 6 weeks.  Also refer to After Visit Summary Diet: Regular Medications: Allergies as of 11/07/2017   No Known Allergies     Medication List    TAKE these medications   CITRANATAL ASSURE 35-1 & 300 MG tablet Take 1 tablet by mouth daily.   docusate sodium 100 MG capsule Commonly known as:  COLACE Take 1 capsule (100 mg total) by mouth daily as needed.   ibuprofen 600 MG tablet Commonly known as:  ADVIL,MOTRIN Take 1 tablet (600 mg total) by mouth every 6 (six) hours.   norethindrone 0.35 MG tablet Commonly known as:  MICRONOR,CAMILA,ERRIN Take 1 tablet (0.35 mg total) by mouth daily.   valACYclovir 500 MG tablet Commonly known  as:  VALTREX Take 1 tablet (500 mg total) by mouth as needed. What changed:    when to take this  reasons to take this      Outpatient follow up:  Postpartum contraception: Camilla to start at 4 wks pp.   Discharged Condition: good  Discharged to: home  Newborn Data: Disposition:home with mother  Apgars: APGAR (1 MIN): 7   APGAR (5 MINS): 9   APGAR (10 MINS):    Baby Feeding: Breast    Philip Aspen, CNM  11/07/2017 9:02 AM

## 2017-11-07 NOTE — Anesthesia Postprocedure Evaluation (Signed)
Anesthesia Post Note  Patient: Cynthia Harrell  Procedure(s) Performed: AN AD HOC LABOR EPIDURAL  Patient location during evaluation: Mother Baby Anesthesia Type: Epidural Level of consciousness: awake and alert Pain management: pain level controlled Vital Signs Assessment: post-procedure vital signs reviewed and stable Respiratory status: spontaneous breathing, nonlabored ventilation and respiratory function stable Cardiovascular status: stable Postop Assessment: no headache, no backache and epidural receding Anesthetic complications: no     Last Vitals:  Vitals:   11/06/17 2348 11/07/17 0429  BP: 120/66 93/76  Pulse: (!) 108 (!) 106  Resp: 19 18  Temp: 36.6 C 36.6 C  SpO2: 98% 96%    Last Pain:  Vitals:   11/07/17 0429  TempSrc: Oral  PainSc:                  Brantley Fling

## 2017-11-08 NOTE — Final Progress Note (Signed)
Discharge Day SOAP Note:  Progress Note - Vaginal Delivery  Cynthia Harrell is a 32 y.o. G3P1012 now PP day 2 s/p Vaginal, Spontaneous . Delivery was uncomplicated. Discharge yesterday was delayed due to fetal indications ( increased bilirubin).  Subjective  The patient has the following complaints: has no unusual complaints  Pain is controlled with current medications.   Patient is urinating without difficulty.  She is ambulating well.    Objective  Vital signs: BP 115/71 (BP Location: Right Arm)   Pulse 89   Temp 98.4 F (36.9 C) (Oral)   Resp 20   Ht 5\' 6"  (1.676 m)   Wt 189 lb (85.7 kg)   LMP 01/26/2017 (Approximate)   SpO2 99%   Breastfeeding? Unknown   BMI 30.51 kg/m   Physical Exam: Gen: NAD Lungs clear Heart: RRR Bowel sounds present Fundus Fundal Tone: Firm  Lochia Amount: Small  Perineum Appearance: Edematous                Data Review Labs: CBC Latest Ref Rng & Units 11/07/2017 11/06/2017 08/04/2017  WBC 3.6 - 11.0 K/uL 14.1(H) 11.7(H) 10.7  Hemoglobin 12.0 - 16.0 g/dL 10.4(L) 12.8 11.1  Hematocrit 35.0 - 47.0 % 30.6(L) 37.1 33.9(L)  Platelets 150 - 440 K/uL 149(L) 161 204   A POS  Assessment/Plan  Active Problems:   Term pregnancy   Labor and delivery, indication for care    Plan for discharge today.   Discharge Instructions: Per After Visit Summary. Activity: Advance as tolerated. Pelvic rest for 6 weeks.  Also refer to After Visit Summary Diet: Regular Medications: Allergies as of 11/07/2017   No Known Allergies             Medication List     TAKE these medications   CITRANATAL ASSURE 35-1 & 300 MG tablet Take 1 tablet by mouth daily.   docusate sodium 100 MG capsule Commonly known as:  COLACE Take 1 capsule (100 mg total) by mouth daily as needed.   ibuprofen 600 MG tablet Commonly known as:  ADVIL,MOTRIN Take 1 tablet (600 mg total) by mouth every 6 (six) hours.   norethindrone 0.35 MG tablet Commonly  known as:  MICRONOR,CAMILA,ERRIN Take 1 tablet (0.35 mg total) by mouth daily.   valACYclovir 500 MG tablet Commonly known as:  VALTREX Take 1 tablet (500 mg total) by mouth as needed. What changed:    when to take this  reasons to take this      Outpatient follow up:  Postpartum contraception: Camilla to start at 4 wks pp.   Discharged Condition: good  Discharged to: home  Newborn Data: Disposition:home with mother  Apgars: APGAR (1 MIN): 7   APGAR (5 MINS): 9   APGAR (10 MINS):    Baby Feeding: Breast    Philip Aspen, CNM

## 2017-11-08 NOTE — Progress Notes (Signed)
Patient discharged home with infant and family. Discharge instructions, prescriptions and follow up appointment given to and reviewed with patient. Patient verbalized understanding. Escorted out via wheelchair by auxiliary.

## 2017-11-15 ENCOUNTER — Telehealth: Payer: Self-pay | Admitting: Certified Nurse Midwife

## 2017-11-15 NOTE — Telephone Encounter (Signed)
The patient called and stated that she would like to speak with a nurse in regards to her wanting to stop breast feeding. No other information was disclosed, Please advise.

## 2017-12-18 ENCOUNTER — Ambulatory Visit (INDEPENDENT_AMBULATORY_CARE_PROVIDER_SITE_OTHER): Payer: Managed Care, Other (non HMO) | Admitting: Certified Nurse Midwife

## 2017-12-18 DIAGNOSIS — N898 Other specified noninflammatory disorders of vagina: Secondary | ICD-10-CM | POA: Diagnosis not present

## 2017-12-18 MED ORDER — NORGESTIM-ETH ESTRAD TRIPHASIC 0.18/0.215/0.25 MG-25 MCG PO TABS: 1.0000 | ORAL_TABLET | Freq: Every day | ORAL | 11 refills | Status: DC

## 2017-12-18 NOTE — Progress Notes (Signed)
Subjective:    Cynthia Harrell is a 32 y.o. [phone] Caucasian female who presents for a postpartum visit. She is 6 weeks postpartum following a spontaneous vaginal delivery at 40.4 gestational weeks. Anesthesia: epidural. I have fully reviewed the prenatal and intrapartum course. Postpartum course has been WNL. Baby's course has been WNL. Baby is feeding by bottle. Bleeding no bleeding. Bowel function is normal. Bladder function is normal. Patient is not sexually active. Last sexual activity: prior to delivery. Contraception method is OCP (estrogen/progesterone). Postpartum depression screening: negative. Score 4.    The following portions of the patient's history were reviewed and updated as appropriate: allergies, current medications, past medical history, past surgical history and problem list.  Review of Systems Pertinent items are noted in HPI.   Vitals:   12/18/17 1604  BP: 112/67  Pulse: 78  Weight: 168 lb 6 oz (76.4 kg)  Height: 5\' 6"  (1.676 m)   Patient's last menstrual period was 12/17/2017 (exact date).  Objective:   General:  alert, cooperative and no distress   Breasts:  deferred, no complaints  Lungs: clear to auscultation bilaterally  Heart:  regular rate and rhythm  Abdomen: soft, nontender   Vulva: normal  Vagina: normal vagina  Cervix:  closed  Corpus: Well-involuted  Adnexa:  Non-palpable  Rectal Exam:  hemorrhoid x1 , not inflamed         Assessment:   Postpartum exam 6 wks s/p SVD Bottle feeding Depression screening negative Contraception counseling   Plan:  : OCP (estrogen/progesterone) Follow up in: 6 months for annual with pap smear or earlier if needed  Philip Aspen, CNM

## 2017-12-18 NOTE — Progress Notes (Signed)
Pt is here for a PPV. LMP 12/17/17. Has not started OCPs yet. Is bottle feeding. Has not resumed intercourse.Screening 4

## 2017-12-18 NOTE — Patient Instructions (Signed)
Kegel Exercises Kegel exercises help strengthen the muscles that support the rectum, vagina, small intestine, bladder, and uterus. Doing Kegel exercises can help:  Improve bladder and bowel control.  Improve sexual response.  Reduce problems and discomfort during pregnancy.  Kegel exercises involve squeezing your pelvic floor muscles, which are the same muscles you squeeze when you try to stop the flow of urine. The exercises can be done while sitting, standing, or lying down, but it is best to vary your position. Phase 1 exercises 1. Squeeze your pelvic floor muscles tight. You should feel a tight lift in your rectal area. If you are a female, you should also feel a tightness in your vaginal area. Keep your stomach, buttocks, and legs relaxed. 2. Hold the muscles tight for up to 10 seconds. 3. Relax your muscles. Repeat this exercise 50 times a day or as many times as told by your health care provider. Continue to do this exercise for at least 4-6 weeks or for as long as told by your health care provider. This information is not intended to replace advice given to you by your health care provider. Make sure you discuss any questions you have with your health care provider. Document Released: 08/22/2012 Document Revised: 04/30/2016 Document Reviewed: 07/26/2015 Elsevier Interactive Patient Education  2018 [place] 18-39 Years, Female Preventive care refers to lifestyle choices and visits with your health care provider that can promote health and wellness. What does preventive care include?  A yearly physical exam. This is also called an annual well check.  Dental exams once or twice a year.  Routine eye exams. Ask your health care provider how often you should have your eyes checked.  Personal lifestyle choices, including: ? Daily care of your teeth and gums. ? Regular physical activity. ? Eating a healthy diet. ? Avoiding tobacco and drug use. ? Limiting  alcohol use. ? Practicing safe sex. ? Taking vitamin and mineral supplements as recommended by your health care provider. What happens during an annual well check? The services and screenings done by your health care provider during your annual well check will depend on your age, overall health, lifestyle risk factors, and family history of disease. Counseling Your health care provider may ask you questions about your:  Alcohol use.  Tobacco use.  Drug use.  Emotional well-being.  Home and relationship well-being.  Sexual activity.  Eating habits.  Work and work Statistician.  Method of birth control.  Menstrual cycle.  Pregnancy history.  Screening You may have the following tests or measurements:  Height, weight, and BMI.  Diabetes screening. This is done by checking your blood sugar (glucose) after you have not eaten for a while (fasting).  Blood pressure.  Lipid and cholesterol levels. These may be checked every 5 years starting at age 29.  Skin check.  Hepatitis C blood test.  Hepatitis B blood test.  Sexually transmitted disease (STD) testing.  BRCA-related cancer screening. This may be done if you have a family history of breast, ovarian, tubal, or peritoneal cancers.  Pelvic exam and Pap test. This may be done every 3 years starting at age 50. Starting at age 80, this may be done every 5 years if you have a Pap test in combination with an HPV test.  Discuss your test results, treatment options, and if necessary, the need for more tests with your health care provider. Vaccines Your health care provider may recommend certain vaccines, such as:  Influenza vaccine. This is  recommended every year.  Tetanus, diphtheria, and acellular pertussis (Tdap, Td) vaccine. You may need a Td booster every 10 years.  Varicella vaccine. You may need this if you have not been vaccinated.  HPV vaccine. If you are 3 or younger, you may need three doses over 6  months.  Measles, mumps, and rubella (MMR) vaccine. You may need at least one dose of MMR. You may also need a second dose.  Pneumococcal 13-valent conjugate (PCV13) vaccine. You may need this if you have certain conditions and were not previously vaccinated.  Pneumococcal polysaccharide (PPSV23) vaccine. You may need one or two doses if you smoke cigarettes or if you have certain conditions.  Meningococcal vaccine. One dose is recommended if you are age 93-21 years and a first-year college student living in a residence hall, or if you have one of several medical conditions. You may also need additional booster doses.  Hepatitis A vaccine. You may need this if you have certain conditions or if you travel or work in places where you may be exposed to hepatitis A.  Hepatitis B vaccine. You may need this if you have certain conditions or if you travel or work in places where you may be exposed to hepatitis B.  Haemophilus influenzae type b (Hib) vaccine. You may need this if you have certain risk factors.  Talk to your health care provider about which screenings and vaccines you need and how often you need them. This information is not intended to replace advice given to you by your health care provider. Make sure you discuss any questions you have with your health care provider. Document Released: 11/01/2001 Document Revised: 05/25/2016 Document Reviewed: 07/07/2015 Elsevier Interactive Patient Education  [employer].

## 2017-12-20 LAB — NUSWAB BV AND CANDIDA, NAA
CANDIDA ALBICANS, NAA: NEGATIVE
Candida glabrata, NAA: NEGATIVE

## 2017-12-25 ENCOUNTER — Encounter (INDEPENDENT_AMBULATORY_CARE_PROVIDER_SITE_OTHER): Payer: Self-pay

## 2018-02-01 ENCOUNTER — Ambulatory Visit: Payer: Medicaid Other | Admitting: Medical

## 2018-02-01 ENCOUNTER — Encounter: Payer: Self-pay | Admitting: Medical

## 2018-02-01 VITALS — BP 98/60 | HR 100 | Temp 99.0°F | Wt 165.0 lb

## 2018-02-01 DIAGNOSIS — M542 Cervicalgia: Secondary | ICD-10-CM

## 2018-02-01 MED ORDER — CYCLOBENZAPRINE HCL 5 MG PO TABS: 5.0000 mg | ORAL_TABLET | Freq: Every day | ORAL | 0 refills | Status: DC

## 2018-02-01 NOTE — Patient Instructions (Addendum)
Ibuprofen 600mg   ( 3 tablets) every  6 hours as needed for pain.  Seek out medical care for further evaluation if fever 100. 5 or higher.    Cyclobenzaprine tablets What is this medicine? CYCLOBENZAPRINE (sye kloe BEN za preen) is a muscle relaxer. It is used to treat muscle pain, spasms, and stiffness. This medicine may be used for other purposes; ask your health care provider or pharmacist if you have questions. COMMON BRAND NAME(S): Fexmid, Flexeril What should I tell my health care provider before I take this medicine? They need to know if you have any of these conditions: -heart disease, irregular heartbeat, or previous heart attack -liver disease -thyroid problem -an unusual or allergic reaction to cyclobenzaprine, tricyclic antidepressants, lactose, other medicines, foods, dyes, or preservatives -pregnant or trying to get pregnant -breast-feeding How should I use this medicine? Take this medicine by mouth with a glass of water. Follow the directions on the prescription label. If this medicine upsets your stomach, take it with food or milk. Take your medicine at regular intervals. Do not take it more often than directed. Talk to your pediatrician regarding the use of this medicine in children. Special care may be needed. Overdosage: If you think you have taken too much of this medicine contact a poison control center or emergency room at once. NOTE: This medicine is only for you. Do not share this medicine with others. What if I miss a dose? If you miss a dose, take it as soon as you can. If it is almost time for your next dose, take only that dose. Do not take double or extra doses. What may interact with this medicine? Do not take this medicine with any of the following medications: -certain medicines for fungal infections like fluconazole, itraconazole, ketoconazole, posaconazole, voriconazole -cisapride -dofetilide -dronedarone -halofantrine -levomethadyl -MAOIs like Carbex,  Eldepryl, Marplan, Nardil, and Parnate -narcotic medicines for cough -pimozide -thioridazine -ziprasidone This medicine may also interact with the following medications: -alcohol -antihistamines for allergy, cough and cold -certain medicines for anxiety or sleep -certain medicines for cancer -certain medicines for depression like amitriptyline, fluoxetine, sertraline -certain medicines for infection like alfuzosin, chloroquine, clarithromycin, levofloxacin, mefloquine, pentamidine, troleandomycin -certain medicines for irregular heart beat -certain medicines for seizures like phenobarbital, primidone -contrast dyes -general anesthetics like halothane, isoflurane, methoxyflurane, propofol -local anesthetics like lidocaine, pramoxine, tetracaine -medicines that relax muscles for surgery -narcotic medicines for pain -other medicines that prolong the QT interval (cause an abnormal heart rhythm) -phenothiazines like chlorpromazine, mesoridazine, prochlorperazine This list may not describe all possible interactions. Give your health care provider a list of all the medicines, herbs, non-prescription drugs, or dietary supplements you use. Also tell them if you smoke, drink alcohol, or use illegal drugs. Some items may interact with your medicine. What should I watch for while using this medicine? Tell your doctor or health care professional if your symptoms do not start to get better or if they get worse. You may get drowsy or dizzy. Do not drive, use machinery, or do anything that needs mental alertness until you know how this medicine affects you. Do not stand or sit up quickly, especially if you are an older patient. This reduces the risk of dizzy or fainting spells. Alcohol may interfere with the effect of this medicine. Avoid alcoholic drinks. If you are taking another medicine that also causes drowsiness, you may have more side effects. Give your health care provider a list of all medicines  you use. Your doctor will tell  you how much medicine to take. Do not take more medicine than directed. Call emergency for help if you have problems breathing or unusual sleepiness. Your mouth may get dry. Chewing sugarless gum or sucking hard candy, and drinking plenty of water may help. Contact your doctor if the problem does not go away or is severe. What side effects may I notice from receiving this medicine? Side effects that you should report to your doctor or health care professional as soon as possible: -allergic reactions like skin rash, itching or hives, swelling of the face, lips, or tongue -breathing problems -chest pain -fast, irregular heartbeat -hallucinations -seizures -unusually weak or tired Side effects that usually do not require medical attention (report to your doctor or health care professional if they continue or are bothersome): -headache -nausea, vomiting This list may not describe all possible side effects. Call your doctor for medical advice about side effects. You may report side effects to FDA at 1-800-FDA-1088. Where should I keep my medicine? Keep out of the reach of children. Store at room temperature between 15 and 30 degrees C (59 and 86 degrees F). Keep container tightly closed. Throw away any unused medicine after the expiration date. NOTE: This sheet is a summary. It may not cover all possible information. If you have questions about this medicine, talk to your doctor, pharmacist, or health care provider.  2018 Elsevier/Gold Standard (2015-06-16 12:05:46) Muscle Pain, Adult Muscle pain (myalgia) may be mild or severe. In most cases, the pain lasts only a short time and it goes away without treatment. It is normal to feel some muscle pain after starting a workout program. Muscles that have not been used often will be sore at first. Muscle pain may also be caused by many other things, including:  Overuse or muscle strain, especially if you are not in shape.  This is the most common cause of muscle pain.  Injury.  Bruises.  Viruses, such as the flu.  Infectious diseases.  A chronic condition that causes muscle tenderness, fatigue, and headache (fibromyalgia).  A condition, such as lupus, in which the body's disease-fighting system attacks other organs in the body (autoimmune or rheumatologic diseases).  Certain drugs, including ACE inhibitors and statins.  To diagnose the cause of your muscle pain, your health care provider will do a physical exam and ask questions about the pain and when it began. If you have not had muscle pain for very long, your health care provider may want to wait before doing much testing. If your muscle pain has lasted a long time, your health care provider may want to run tests right away. In some cases, this may include tests to rule out certain conditions or illnesses. Treatment for muscle pain depends on the cause. Home care is often enough to relieve muscle pain. Your health care provider may also prescribe anti-inflammatory medicine. Follow these instructions at home: Activity  If overuse is causing your muscle pain: ? Slow down your activities until the pain goes away. ? Do regular, gentle exercises if you are not usually active. ? Warm up before exercising. Stretch before and after exercising. This can help lower the risk of muscle pain.  Do not continue working out if the pain is very bad. Bad pain could mean that you have injured a muscle. Managing pain and discomfort   If directed, apply ice to the sore muscle: ? Put ice in a plastic bag. ? Place a towel between your skin and the bag. ? Leave  the ice on for 20 minutes, 2-3 times a day.  You may also alternate between applying ice and applying heat as told by your health care provider. To apply heat, use the heat source that your health care provider recommends, such as a moist heat pack or a heating pad. ? Place a towel between your skin and the heat  source. ? Leave the heat on for 20-30 minutes. ? Remove the heat if your skin turns bright red. This is especially important if you are unable to feel pain, heat, or cold. You may have a greater risk of getting burned. Medicines  Take over-the-counter and prescription medicines only as told by your health care provider.  Do not drive or use heavy machinery while taking prescription pain medicine. Contact a health care provider if:  Your muscle pain gets worse and medicines do not help.  You have muscle pain that lasts longer than 3 days.  You have a rash or fever along with muscle pain.  You have muscle pain after a tick bite.  You have muscle pain while working out, even though you are in good physical condition.  You have redness, soreness, or swelling along with muscle pain.  You have muscle pain after starting a new medicine or changing the dose of a medicine. Get help right away if:  You have trouble breathing.  You have trouble swallowing.  You have muscle pain along with a stiff neck, fever, and vomiting.  You have severe muscle weakness or cannot move part of your body. This information is not intended to replace advice given to you by your health care provider. Make sure you discuss any questions you have with your health care provider. Document Released: 07/28/2006 Document Revised: 03/25/2016 Document Reviewed: 01/26/2016 Elsevier Interactive Patient Education  2018 [employer].

## 2018-02-01 NOTE — Progress Notes (Signed)
Subjective:    Patient ID: Cynthia Harrell, female    DOB: 27-Jul-1986, 32 y.o.   MRN: [id number]  HPI  32 yo in non acute distress. Woke up this am and then took a nap, slept with baby and then woke up with right side of scapula radiating into the right side of neck pain. Denies numbness or tingling but has sharp pain with turning head to the right. Denies headache, no pain radiating into arm or the hand . 10/10 pain . Still hurts some to turn head to the left. Crying at about 3 pm . Works as a Programme researcher, broadcasting/film/video at [employer].  Works at [employer] during the day. Blood pressure 98/60, pulse 100, temperature 99 F (37.2 C), weight 165 lb (74.8 kg), last menstrual period 01/18/2018, SpO2 98 %, not currently breastfeeding.  Review of Systems  Constitutional: Positive for chills and fatigue. Negative for activity change (painful turning neck carefull picking up 3 months old baby due to pain.), appetite change and fever.  HENT: Positive for postnasal drip (has noticed). Negative for congestion, ear discharge, ear pain, rhinorrhea and sore throat.   Eyes: Negative for discharge, itching and visual disturbance.  Respiratory: Negative for cough, chest tightness, shortness of breath and wheezing.   Cardiovascular: Negative for chest pain, palpitations and leg swelling.  Gastrointestinal: Negative for abdominal pain (left side lasting  3 minutes, dull pain now gone).  Endocrine: Negative for cold intolerance, heat intolerance, polydipsia and polyphagia.  Genitourinary: Negative for dysuria, hematuria and vaginal bleeding.  Musculoskeletal: Negative for arthralgias, back pain, gait problem, joint swelling, myalgias, neck pain and neck stiffness.  Skin: Negative for color change, pallor, rash and wound.  Allergic/Immunologic: Negative for environmental allergies and food allergies.  Neurological: Positive for weakness ("like tired"). Negative for dizziness, tremors, seizures, syncope, facial asymmetry,  speech difficulty, light-headedness, numbness and headaches.  Hematological: Negative.   Psychiatric/Behavioral: Negative for agitation, behavioral problems, confusion, decreased concentration, dysphoric mood, hallucinations, self-injury, sleep disturbance and suicidal ideas. The patient is not nervous/anxious and is not hyperactive.    Not breast feeding.    Objective:   Physical Exam  Constitutional: She is oriented to person, place, and time. She appears well-developed and well-nourished.  HENT:  Head: Normocephalic.  Right Ear: External ear normal.  Left Ear: External ear normal.  Nose: Nose normal.  Mouth/Throat: Oropharynx is clear and moist.  Eyes: Pupils are equal, round, and reactive to light. Conjunctivae and EOM are normal.  Neck: Normal range of motion. Neck supple.  Cardiovascular: Normal rate, regular rhythm, normal heart sounds and intact distal pulses.  Pulmonary/Chest: Effort normal and breath sounds normal.  Abdominal: Soft. Bowel sounds are normal. She exhibits no distension and no mass. There is no tenderness. There is no rebound and no guarding. No hernia.  Lymphadenopathy:    She has no cervical adenopathy.  Neurological: She is alert and oriented to person, place, and time.  Skin: Skin is warm and dry.  Psychiatric: She has a normal mood and affect. Her behavior is normal. Judgment and thought content normal.  Nursing note and vitals reviewed.   Tender along the mid left medial side of right scapula and into trapezius on the right side.  Good range of motion to the left but with pain.  large tonsils patient says this is her norm. Answering phone to check on child and able to move neck without problems to the right when I ask her how her baby is doing.  Assessment & Plan:  Neck pain right side Ibuprofen 600 mg every 6 hours as needed for pain. Apply heat and ice to the area. as reviewed. Meds ordered this encounter  Medications  . cyclobenzaprine  (FLEXERIL) 5 MG tablet    Sig: Take 1 tablet (5 mg total) by mouth at bedtime.    Dispense:  15 tablet    Refill:  0  Work note for [employer] and tomorrow. If fever of 100.5 or higher needs to be reevaluated for possible infection. If not improving in 3-5 days further evaluation is needed. May return to [facility] for reevalutation. Patient verbalizes understanding and has no question at  discharge. Patient concerned about  99 temp explained it is not really elevated and it was 79 degrees out today ( now). She was worried she may get her baby sick. I recommended she not kiss or be close with baby's face. She understands.

## 2018-03-27 ENCOUNTER — Ambulatory Visit (INDEPENDENT_AMBULATORY_CARE_PROVIDER_SITE_OTHER): Payer: Managed Care, Other (non HMO) | Admitting: Obstetrics and Gynecology

## 2018-03-27 ENCOUNTER — Other Ambulatory Visit: Payer: Self-pay | Admitting: Obstetrics and Gynecology

## 2018-03-27 ENCOUNTER — Encounter: Payer: Self-pay | Admitting: Obstetrics and Gynecology

## 2018-03-27 VITALS — BP 126/77 | HR 89 | Ht 66.0 in | Wt 169.4 lb

## 2018-03-27 DIAGNOSIS — B009 Herpesviral infection, unspecified: Secondary | ICD-10-CM | POA: Diagnosis not present

## 2018-03-27 DIAGNOSIS — R300 Dysuria: Secondary | ICD-10-CM

## 2018-03-27 DIAGNOSIS — R319 Hematuria, unspecified: Principal | ICD-10-CM

## 2018-03-27 DIAGNOSIS — N390 Urinary tract infection, site not specified: Secondary | ICD-10-CM

## 2018-03-27 DIAGNOSIS — N9089 Other specified noninflammatory disorders of vulva and perineum: Secondary | ICD-10-CM

## 2018-03-27 LAB — POCT URINALYSIS DIPSTICK
Bilirubin, UA: NEGATIVE
Glucose, UA: NEGATIVE
Ketones, UA: NEGATIVE
Nitrite, UA: NEGATIVE
PH UA: 7 (ref 5.0–8.0)
Protein, UA: POSITIVE — AB
Spec Grav, UA: 1.015 (ref 1.010–1.025)
UROBILINOGEN UA: 0.2 U/dL

## 2018-03-27 MED ORDER — NITROFURANTOIN MONOHYD MACRO 100 MG PO CAPS: 100.0000 mg | ORAL_CAPSULE | Freq: Two times a day (BID) | ORAL | 1 refills | Status: DC

## 2018-03-27 MED ORDER — PHENAZOPYRIDINE HCL 200 MG PO TABS: 200.0000 mg | ORAL_TABLET | Freq: Three times a day (TID) | ORAL | 1 refills | Status: DC | PRN

## 2018-03-27 NOTE — Progress Notes (Signed)
  Subjective:     Patient ID: Cynthia Harrell, female   DOB: November 09, 1985, 32 y.o.   MRN: [id number]  HPI Increased urinary urgency and pain with urination since yesterday. Having some increase vaginal discharge and is yellow in color with mild odor. Just took morning after pill. Not sexually active regularly.   Review of Systems  Constitutional: Negative.   Gastrointestinal: Negative.   Endocrine: Negative.   Genitourinary: Positive for dysuria, frequency and vaginal discharge.  Neurological: Negative.   Psychiatric/Behavioral: Negative.        Objective:   Physical Exam A&Ox4 Well groomed female Blood pressure 126/77, pulse 89, height 5\' 6"  (1.676 m), weight 169 lb 6.4 oz (76.8 kg), last menstrual period 03/12/2018, not currently breastfeeding. Urinalysis    Component Value Date/Time   COLORURINE Yellow 03/24/2013 2159   APPEARANCEUR Clear 03/17/2017 1630   LABSPEC 1.024 03/24/2013 2159   PHURINE 6.0 03/24/2013 2159   GLUCOSEU Negative 03/17/2017 1630   GLUCOSEU Negative 03/24/2013 2159   HGBUR Negative 03/24/2013 2159   BILIRUBINUR neg 03/27/2018 1026   BILIRUBINUR Negative 03/17/2017 1630   BILIRUBINUR Negative 03/24/2013 2159   KETONESUR Negative 03/24/2013 2159   PROTEINUR Positive (A) 03/27/2018 1026   PROTEINUR Negative 03/17/2017 1630   PROTEINUR Negative 03/24/2013 2159   UROBILINOGEN 0.2 03/27/2018 1026   NITRITE neg 03/27/2018 1026   NITRITE Negative 03/17/2017 1630   NITRITE Negative 03/24/2013 2159   LEUKOCYTESUR Small (1+) (A) 03/27/2018 1026   LEUKOCYTESUR Negative 03/17/2017 1630   LEUKOCYTESUR 2+ 03/24/2013 2159    Pelvic exam: VULVA: vulvar lesion at 7 and 5 o'clock, VAGINA: normal appearing vagina with normal color and discharge, no lesions, WET MOUNT done - results: negative for pathogens, normal epithelial cells, CERVIX: normal appearing cervix without discharge or lesions.    Assessment:     Dysuria Herpes outbreak    Plan:     Antibiotic  sent and encouraged increasing acyclovir x 3 days. Urine sent for culture.  RTC as needed.  Melody Shambley,CNM

## 2018-03-31 LAB — URINE CULTURE

## 2018-05-18 ENCOUNTER — Telehealth: Payer: Self-pay | Admitting: Obstetrics and Gynecology

## 2018-05-18 NOTE — Telephone Encounter (Signed)
She will have to see annie, I dont think anyone else in the office does ua drop offs, MNS is gone for the day!! Thanks

## 2018-05-18 NOTE — Telephone Encounter (Signed)
Her medicaid is being evaluated, and her work insurance ends this month, and she is having pressure, frequency but no dysuria at this time.  She typically does not have dysuria tho.  She is wondering if she can do a urine drop or does she need to come in today?  Please advise, thanks.

## 2018-06-21 ENCOUNTER — Encounter: Payer: Managed Care, Other (non HMO) | Admitting: Obstetrics and Gynecology

## 2018-07-18 ENCOUNTER — Telehealth: Payer: Self-pay | Admitting: Obstetrics and Gynecology

## 2018-07-18 NOTE — Telephone Encounter (Signed)
The patient is having UTI symptoms; frequency of urination, and she only has Family Planning Medicaid so she is asking what her options are since she would be considered self pay to be seen, please advise, thanks.

## 2018-07-18 NOTE — Telephone Encounter (Signed)
Spoke with pt she doesn't have insurance at the moment, we discussed her options she is going to check with medicaid and see if she has been approved yet or not, pt will get back with me

## 2018-07-20 ENCOUNTER — Encounter: Payer: Self-pay | Admitting: Obstetrics and Gynecology

## 2018-07-20 ENCOUNTER — Ambulatory Visit (INDEPENDENT_AMBULATORY_CARE_PROVIDER_SITE_OTHER): Payer: Medicaid Other | Admitting: Obstetrics and Gynecology

## 2018-07-20 VITALS — BP 118/78 | HR 98 | Ht 66.0 in | Wt 163.6 lb

## 2018-07-20 DIAGNOSIS — R300 Dysuria: Secondary | ICD-10-CM

## 2018-07-20 LAB — POCT URINALYSIS DIPSTICK
Bilirubin, UA: NEGATIVE
Blood, UA: NEGATIVE
Glucose, UA: NEGATIVE
Ketones, UA: NEGATIVE
NITRITE UA: NEGATIVE
Protein, UA: NEGATIVE
SPEC GRAV UA: 1.015 (ref 1.010–1.025)
Urobilinogen, UA: 0.2 E.U./dL
pH, UA: 6 (ref 5.0–8.0)

## 2018-07-20 MED ORDER — NITROFURANTOIN MONOHYD MACRO 100 MG PO CAPS: 100.0000 mg | ORAL_CAPSULE | Freq: Two times a day (BID) | ORAL | 1 refills | Status: DC

## 2018-07-20 NOTE — Progress Notes (Signed)
Subjective:     Patient ID: Cynthia Harrell, female   DOB: 1985-11-07, 32 y.o.   MRN: [id number]  HPI  Reports increased urinary frequency and vaginal discharge, but no fever or odor. Low back is hurting a little. Been occurring for month. No new sexually partners. Has been treated for 2 UTIs in last 6 months.     Review of Systems  All other systems reviewed and are negative.      Objective:   Physical Exam A&Ox4 Well groomed female in no distress Blood pressure 118/78, pulse 98, height 5\' 6"  (1.676 m), weight 163 lb 9.6 oz (74.2 kg), last menstrual period 06/23/2018, not currently breastfeeding. Urinalysis    Component Value Date/Time   COLORURINE Yellow 03/24/2013 2159   APPEARANCEUR Clear 03/17/2017 1630   LABSPEC 1.024 03/24/2013 2159   PHURINE 6.0 03/24/2013 2159   GLUCOSEU Negative 03/17/2017 1630   GLUCOSEU Negative 03/24/2013 2159   HGBUR Negative 03/24/2013 2159   BILIRUBINUR neg 07/20/2018 1022   BILIRUBINUR Negative 03/17/2017 1630   BILIRUBINUR Negative 03/24/2013 2159   KETONESUR Negative 03/24/2013 2159   PROTEINUR Negative 07/20/2018 1022   PROTEINUR Negative 03/17/2017 1630   PROTEINUR Negative 03/24/2013 2159   UROBILINOGEN 0.2 07/20/2018 1022   NITRITE neg 07/20/2018 1022   NITRITE Negative 03/17/2017 1630   NITRITE Negative 03/24/2013 2159   LEUKOCYTESUR Large (3+) (A) 07/20/2018 1022   LEUKOCYTESUR Negative 03/17/2017 1630   LEUKOCYTESUR 2+ 03/24/2013 2159   Pelvic exam: normal external genitalia, vulva, vagina, cervix, uterus and adnexa, WET MOUNT done - results: negative for pathogens, normal epithelial cells.    Assessment:     Urinary frequency     Plan:     macrobid prescribed while waiting on urine culture results. RTC in a few weeks for annual exam as it is due.  Audryana Hockenberry Shmabley,CNM

## 2018-07-22 LAB — URINE CULTURE

## 2018-08-14 ENCOUNTER — Encounter: Payer: Medicaid Other | Admitting: Obstetrics and Gynecology

## 2018-08-15 ENCOUNTER — Encounter: Payer: Medicaid Other | Admitting: Obstetrics and Gynecology

## 2018-09-14 ENCOUNTER — Encounter: Payer: Self-pay | Admitting: Obstetrics and Gynecology

## 2018-09-14 ENCOUNTER — Ambulatory Visit (INDEPENDENT_AMBULATORY_CARE_PROVIDER_SITE_OTHER): Payer: Medicaid Other | Admitting: Obstetrics and Gynecology

## 2018-09-14 VITALS — BP 120/78 | HR 88 | Ht 66.0 in | Wt 165.7 lb

## 2018-09-14 DIAGNOSIS — Z01419 Encounter for gynecological examination (general) (routine) without abnormal findings: Secondary | ICD-10-CM

## 2018-09-14 DIAGNOSIS — Z0000 Encounter for general adult medical examination without abnormal findings: Secondary | ICD-10-CM | POA: Diagnosis not present

## 2018-09-14 DIAGNOSIS — B373 Candidiasis of vulva and vagina: Secondary | ICD-10-CM

## 2018-09-14 DIAGNOSIS — B3731 Acute candidiasis of vulva and vagina: Secondary | ICD-10-CM

## 2018-09-14 DIAGNOSIS — J020 Streptococcal pharyngitis: Secondary | ICD-10-CM | POA: Diagnosis not present

## 2018-09-14 MED ORDER — FLUCONAZOLE 150 MG PO TABS: 150.0000 mg | ORAL_TABLET | ORAL | 3 refills | Status: DC

## 2018-09-14 MED ORDER — AMOXICILLIN 500 MG PO CAPS: 500.0000 mg | ORAL_CAPSULE | Freq: Three times a day (TID) | ORAL | 2 refills | Status: DC

## 2018-09-14 NOTE — Progress Notes (Signed)
Subjective:   Cynthia Harrell is a 32 y.o. [phone] Caucasian female here for a routine well-woman exam.  Patient's last menstrual period was 08/28/2018.    Current complaints: sore throat and ear pain since yesterday.  PCP: ?       doesn't desire labs  Social History: Sexual: heterosexual Marital Status: married Living situation: with family Occupation: Educational psychologist at [employer] Tobacco/alcohol: no tobacco use Illicit drugs: no history of illicit drug use  The following portions of the patient's history were reviewed and updated as appropriate: allergies, current medications, past family history, past medical history, past social history, past surgical history and problem list.  Past Medical History Past Medical History:  Diagnosis Date  . 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 High-risk sexual behavior   . Microalbuminuria     Past Surgical History Past Surgical History:  Procedure Laterality Date  . THERAPEUTIC ABORTION  2008    Gynecologic History [id number]  Patient's last menstrual period was 08/28/2018. Contraception: OCP (estrogen/progesterone) Last Pap: 12/17. Results were: ASCUS   Obstetric History OB History  Gravida Para Term Preterm AB Living  3 2 1   1 2   SAB TAB Ectopic Multiple Live Births    1   0 2    # Outcome Date GA Lbr Len/2nd Weight Sex Delivery Anes PTL Lv  3 Term 11/06/17 [redacted]w[redacted]d / 00:43 9 lb 4.9 oz (4.22 kg) F Vag-Spont EPI  LIV  2 Para 12/04/06 [redacted]w[redacted]d  8 lb 1 oz (3.657 kg) F Vag-Spont EPI N LIV  1 TAB      TAB       Current Medications Current Outpatient Medications on File Prior to Visit  Medication Sig Dispense Refill  . nitrofurantoin, macrocrystal-monohydrate, (MACROBID) 100 MG capsule Take 1 capsule (100 mg total) by mouth 2 (two) times daily. (Patient not taking: Reported on 09/14/2018) 14 capsule 1  . valACYclovir (VALTREX) 500 MG tablet Take 1 tablet (500 mg total) by mouth as needed. (Patient not taking: Reported on 09/14/2018) 60 tablet  6   No current facility-administered medications on file prior to visit.     Review of Systems Patient denies any headaches, blurred vision, shortness of breath, chest pain, abdominal pain, problems with bowel movements, urination, or intercourse.  Objective:  BP 120/78   Pulse 88   Ht 5\' 6"  (1.676 m)   Wt 165 lb 11.2 oz (75.2 kg)   LMP 08/28/2018   BMI 26.74 kg/m  Physical Exam  General:  Well developed, well nourished, no acute distress. She is alert and oriented x3. Skin:  Warm and dry Neck:  Midline trachea, no thyromegaly or nodules except slightly enlarged lymph nodes bilaterally Ear clear, tonsils enlarged and red with white patches.  Cardiovascular: Regular rate and rhythm, no murmur heard Lungs:  Effort normal, all lung fields clear to auscultation bilaterally Breasts:  No dominant palpable mass, retraction, or nipple discharge Abdomen:  Soft, non tender, no hepatosplenomegaly or masses Pelvic:  External genitalia is normal in appearance.  The vagina is normal in appearance. The cervix is bulbous, no CMT.  Thin prep pap is done with HR HPV cotesting. Uterus is felt to be normal size, shape, and contour.  No adnexal masses or tenderness noted. White discharge c/w yeast noted. Extremities:  No swelling or varicosities noted Psych:  She has a normal mood and affect  Assessment:   Healthy well-woman exam H/O abnormal pap HSV Strep throat Vaginal yeast infection  Plan:  Antibiotics and diflucan  sent in and instructed on use.  F/U 1 year for AE, or sooner if needed   Melody Rockney Ghee, CNM

## 2018-09-14 NOTE — Patient Instructions (Addendum)
 Preventive Care 18-39 Years, Female Preventive care refers to lifestyle choices and visits with your health care provider that can promote health and wellness. What does preventive care include?   A yearly physical exam. This is also called an annual well check.  Dental exams once or twice a year.  Routine eye exams. Ask your health care provider how often you should have your eyes checked.  Personal lifestyle choices, including: ? Daily care of your teeth and gums. ? Regular physical activity. ? Eating a healthy diet. ? Avoiding tobacco and drug use. ? Limiting alcohol use. ? Practicing safe sex. ? Taking vitamin and mineral supplements as recommended by your health care provider. What happens during an annual well check? The services and screenings done by your health care provider during your annual well check will depend on your age, overall health, lifestyle risk factors, and family history of disease. Counseling Your health care provider may ask you questions about your:  Alcohol use.  Tobacco use.  Drug use.  Emotional well-being.  Home and relationship well-being.  Sexual activity.  Eating habits.  Work and work environment.  Method of birth control.  Menstrual cycle.  Pregnancy history. Screening You may have the following tests or measurements:  Height, weight, and BMI.  Diabetes screening. This is done by checking your blood sugar (glucose) after you have not eaten for a while (fasting).  Blood pressure.  Lipid and cholesterol levels. These may be checked every 5 years starting at age 20.  Skin check.  Hepatitis C blood test.  Hepatitis B blood test.  Sexually transmitted disease (STD) testing.  BRCA-related cancer screening. This may be done if you have a family history of breast, ovarian, tubal, or peritoneal cancers.  Pelvic exam and Pap test. This may be done every 3 years starting at age 21. Starting at age 30, this may be done  every 5 years if you have a Pap test in combination with an HPV test. Discuss your test results, treatment options, and if necessary, the need for more tests with your health care provider. Vaccines Your health care provider may recommend certain vaccines, such as:  Influenza vaccine. This is recommended every year.  Tetanus, diphtheria, and acellular pertussis (Tdap, Td) vaccine. You may need a Td booster every 10 years.  Varicella vaccine. You may need this if you have not been vaccinated.  HPV vaccine. If you are 26 or younger, you may need three doses over 6 months.  Measles, mumps, and rubella (MMR) vaccine. You may need at least one dose of MMR. You may also need a second dose.  Pneumococcal 13-valent conjugate (PCV13) vaccine. You may need this if you have certain conditions and were not previously vaccinated.  Pneumococcal polysaccharide (PPSV23) vaccine. You may need one or two doses if you smoke cigarettes or if you have certain conditions.  Meningococcal vaccine. One dose is recommended if you are age 19-21 years and a first-year college student living in a residence hall, or if you have one of several medical conditions. You may also need additional booster doses.  Hepatitis A vaccine. You may need this if you have certain conditions or if you travel or work in places where you may be exposed to hepatitis A.  Hepatitis B vaccine. You may need this if you have certain conditions or if you travel or work in places where you may be exposed to hepatitis B.  Haemophilus influenzae type b (Hib) vaccine. You may need this if   you have certain risk factors. Talk to your health care provider about which screenings and vaccines you need and how often you need them. This information is not intended to replace advice given to you by your health care provider. Make sure you discuss any questions you have with your health care provider. Document Released: 11/01/2001 Document Revised:  04/18/2017 Document Reviewed: 07/07/2015 Elsevier Interactive Patient Education  2019 [employer].  Strep Throat  Strep throat is a bacterial infection of the throat. Your health care provider may call the infection tonsillitis or pharyngitis, depending on whether there is swelling in the tonsils or at the back of the throat. Strep throat is most common during the cold months of the year in children who are 55-80 years of age, but it can happen during any season in people of any age. This infection is spread from person to person (contagious) through coughing, sneezing, or close contact. What are the causes? Strep throat is caused by the bacteria called Streptococcus pyogenes. What increases the risk? This condition is more likely to develop in:  People who spend time in crowded places where the infection can spread easily.  People who have close contact with someone who has strep throat. What are the signs or symptoms? Symptoms of this condition include:  Fever or chills.  Redness, swelling, or pain in the tonsils or throat.  Pain or difficulty when swallowing.  White or yellow spots on the tonsils or throat.  Swollen, tender glands in the neck or under the jaw.  Red rash all over the body (rare). How is this diagnosed? This condition is diagnosed by performing a rapid strep test or by taking a swab of your throat (throat culture test). Results from a rapid strep test are usually ready in a few minutes, but throat culture test results are available after one or two days. How is this treated? This condition is treated with antibiotic medicine. Follow these instructions at home: Medicines  Take over-the-counter and prescription medicines only as told by your health care provider.  Take your antibiotic as told by your health care provider. Do not stop taking the antibiotic even if you start to feel better.  Have family members who also have a sore throat or fever tested for  strep throat. They may need antibiotics if they have the strep infection. Eating and drinking  Do not share food, drinking cups, or personal items that could cause the infection to spread to other people.  If swallowing is difficult, try eating soft foods until your sore throat feels better.  Drink enough fluid to keep your urine clear or pale yellow. General instructions  Gargle with a salt-water mixture 3-4 times per day or as needed. To make a salt-water mixture, completely dissolve -1 tsp of salt in 1 cup of warm water.  Make sure that all household members wash their hands well.  Get plenty of rest.  Stay home from school or work until you have been taking antibiotics for 24 hours.  Keep all follow-up visits as told by your health care provider. This is important. Contact a health care provider if:  The glands in your neck continue to get bigger.  You develop a rash, cough, or earache.  You cough up a thick liquid that is green, yellow-brown, or bloody.  You have pain or discomfort that does not get better with medicine.  Your problems seem to be getting worse rather than better.  You have a fever. Get help right  away if:  You have new symptoms, such as vomiting, severe headache, stiff or painful neck, chest pain, or shortness of breath.  You have severe throat pain, drooling, or changes in your voice.  You have swelling of the neck, or the skin on the neck becomes red and tender.  You have signs of dehydration, such as fatigue, dry mouth, and decreased urination.  You become increasingly sleepy, or you cannot wake up completely.  Your joints become red or painful. This information is not intended to replace advice given to you by your health care provider. Make sure you discuss any questions you have with your health care provider. Document Released: 09/02/2000 Document Revised: 05/04/2016 Document Reviewed: 12/29/2014 Elsevier Interactive Patient Education   [employer].

## 2018-09-18 LAB — PAP IG AND HPV HIGH-RISK: HPV, HIGH-RISK: NEGATIVE

## 2018-09-19 NOTE — L&D Delivery Note (Signed)
       Delivery Note   Cynthia Harrell is a 33 y.o. [id number] at [redacted]w[redacted]d Estimated Date of Delivery: 08/24/19  PRE-OPERATIVE DIAGNOSIS:  1) [redacted]w[redacted]d pregnancy. Gestational diabetic   POST-OPERATIVE DIAGNOSIS:  1) [redacted]w[redacted]d pregnancy s/p Vaginal, Spontaneous   Delivery Type: Vaginal, Spontaneous    Delivery Anesthesia: None   Labor Complications:  none    ESTIMATED BLOOD LOSS: 300 ml    FINDINGS:   1) female infant, Apgar scores of   8 at 1 minute and   9 at 5 minutes. Birth weight pending-infant skin to skin.   2) Nuchal cord:None  SPECIMENS:   PLACENTA:   Appearance:  intact, 3 vessel cord   Removal:    Spontaneous    Disposition:  held per protocol then discarded.   DISPOSITION:  Infant to left in stable condition in the delivery room, with L&D personnel and mother,  NARRATIVE SUMMARY: Labor course:  Ms. Cynthia Harrell is a [id number] at [redacted]w[redacted]d who presented for labor management.  She progressed well in labor without pitocin.  She received no anesthesia and proceeded to complete dilation. She evidenced good maternal expulsive effort during the second stage. She went on to deliver a viable female infant. The placenta delivered without problems and was noted to be complete. A perineal and vaginal examination was performed. Lacerations:  1st degree perineal. The laceration was repaired with a 3-0 Vicryl Rapide suture using local anesthesia. The patient tolerated this well.  Philip Aspen, CNM. 08/09/2019 7:18 PM

## 2018-09-28 DIAGNOSIS — J22 Unspecified acute lower respiratory infection: Secondary | ICD-10-CM | POA: Diagnosis not present

## 2018-09-28 DIAGNOSIS — B373 Candidiasis of vulva and vagina: Secondary | ICD-10-CM | POA: Diagnosis not present

## 2018-09-28 DIAGNOSIS — R05 Cough: Secondary | ICD-10-CM | POA: Diagnosis not present

## 2018-09-28 DIAGNOSIS — R6889 Other general symptoms and signs: Secondary | ICD-10-CM | POA: Diagnosis not present

## 2018-09-28 DIAGNOSIS — J101 Influenza due to other identified influenza virus with other respiratory manifestations: Secondary | ICD-10-CM | POA: Diagnosis not present

## 2018-10-04 ENCOUNTER — Ambulatory Visit: Payer: Medicaid Other | Admitting: Nurse Practitioner

## 2018-10-04 ENCOUNTER — Other Ambulatory Visit: Payer: Self-pay | Admitting: *Deleted

## 2018-10-04 ENCOUNTER — Telehealth: Payer: Self-pay | Admitting: Obstetrics and Gynecology

## 2018-10-04 MED ORDER — FLUCONAZOLE 150 MG PO TABS: 150.0000 mg | ORAL_TABLET | ORAL | 3 refills | Status: DC

## 2018-10-04 NOTE — Telephone Encounter (Signed)
Done-ac 

## 2018-10-04 NOTE — Telephone Encounter (Signed)
The patient had a recent chest x-ray and states she was put on an antibiotic and has subsequently started having issues like a yeast infection.  She was asking if Melody could send her in some "fluconazole" to help with her symptoms.  Please advise, thanks.

## 2018-11-14 ENCOUNTER — Ambulatory Visit (INDEPENDENT_AMBULATORY_CARE_PROVIDER_SITE_OTHER): Payer: Medicaid Other | Admitting: Obstetrics and Gynecology

## 2018-11-14 ENCOUNTER — Encounter: Payer: Self-pay | Admitting: Obstetrics and Gynecology

## 2018-11-14 VITALS — BP 90/62 | HR 86 | Ht 66.0 in | Wt 169.0 lb

## 2018-11-14 DIAGNOSIS — B3731 Acute candidiasis of vulva and vagina: Secondary | ICD-10-CM

## 2018-11-14 DIAGNOSIS — B373 Candidiasis of vulva and vagina: Secondary | ICD-10-CM | POA: Diagnosis not present

## 2018-11-14 DIAGNOSIS — R300 Dysuria: Secondary | ICD-10-CM

## 2018-11-14 LAB — POCT URINALYSIS DIPSTICK
BILIRUBIN UA: NEGATIVE
Blood, UA: NEGATIVE
GLUCOSE UA: NEGATIVE
KETONES UA: NEGATIVE
Nitrite, UA: NEGATIVE
PH UA: 6.5 (ref 5.0–8.0)
Protein, UA: NEGATIVE
Spec Grav, UA: 1.015 (ref 1.010–1.025)
UROBILINOGEN UA: 0.2 U/dL

## 2018-11-14 NOTE — Progress Notes (Signed)
Subjective:     Patient ID: Cynthia Harrell, female   DOB: [birth year], 33 y.o.   MRN: [id number]  HPI Here with c/o increased vaginal discharge and change in urine for one week. Feels like another yeast infection.  Does note increased urgency. DID TAKE A DIFLUCAN A FEW MONTHS AGO AFTER FINISHING ANTIBIOTICS.  Review of Systems  Genitourinary: Positive for vaginal discharge.  All other systems reviewed and are negative.      Objective:   Physical Exam A&Ox4 Well groomed female in no distress Blood pressure 90/62, pulse 86, height 5\' 6"  (1.676 m), weight 169 lb (76.7 kg), last menstrual period 10/15/2018, not currently breastfeeding. Pelvic exam: normal external genitalia, vulva, vagina, cervix, uterus and adnexa, WET MOUNT done - results: negative for pathogens, normal epithelial cells, hyphae, lactobacilli.    Urinalysis    Component Value Date/Time   COLORURINE Yellow 03/24/2013 2159   APPEARANCEUR Clear 03/17/2017 1630   LABSPEC 1.024 03/24/2013 2159   PHURINE 6.0 03/24/2013 2159   GLUCOSEU Negative 03/17/2017 1630   GLUCOSEU Negative 03/24/2013 2159   HGBUR Negative 03/24/2013 2159   BILIRUBINUR neg 11/14/2018 1504   BILIRUBINUR Negative 03/17/2017 1630   BILIRUBINUR Negative 03/24/2013 2159   KETONESUR Negative 03/24/2013 2159   PROTEINUR Negative 11/14/2018 1504   PROTEINUR Negative 03/17/2017 1630   PROTEINUR Negative 03/24/2013 2159   UROBILINOGEN 0.2 11/14/2018 1504   NITRITE neg 11/14/2018 1504   NITRITE Negative 03/17/2017 1630   NITRITE Negative 03/24/2013 2159   LEUKOCYTESUR Small (1+) (A) 11/14/2018 1504   LEUKOCYTESUR Negative 03/17/2017 1630   LEUKOCYTESUR 2+ 03/24/2013 2159    Assessment:     Vaginal yeast infection Urinary frequency    Plan:     Counseled on findings. Patient still has a diflucan at home, so she will take it tonight. Encouraged to increase water intake. RTC as needed.  Tiberius Loftus,CNM

## 2019-01-21 ENCOUNTER — Telehealth: Payer: Self-pay

## 2019-01-21 NOTE — Telephone Encounter (Signed)
Coronavirus (COVID-19) Are you at risk?  Are you at risk for the Coronavirus (COVID-19)?  To be considered HIGH RISK for Coronavirus (COVID-19), you have to meet the following criteria:  . Traveled to [place], [place], [place], [place] or [place]; or in the [place] to [place], [place], [place], or [place]; and have fever, cough, and shortness of breath within the last 2 weeks of travel OR . Been in close contact with a person diagnosed with COVID-19 within the last 2 weeks and have fever, cough, and shortness of breath . IF YOU DO NOT MEET THESE CRITERIA, YOU ARE CONSIDERED LOW RISK FOR COVID-19.  What to do if you are HIGH RISK for COVID-19?  Marland Kitchen If you are having a medical emergency, call 911. . Seek medical care right away. Before you go to a doctor's office, urgent care or emergency department, call ahead and tell them about your recent travel, contact with someone diagnosed with COVID-19, and your symptoms. You should receive instructions from your physician's office regarding next steps of care.  . When you arrive at healthcare provider, tell the healthcare staff immediately you have returned from visiting [place], [place], [place], [place] or [place]; or traveled in the [place] to [place], [place], [place], or [place]; in the last two weeks or you have been in close contact with a person diagnosed with COVID-19 in the last 2 weeks.   . Tell the health care staff about your symptoms: fever, cough and shortness of breath. . After you have been seen by a medical provider, you will be either: o Tested for (COVID-19) and discharged home on quarantine except to seek medical care if symptoms worsen, and asked to  - Stay home and avoid contact with others until you get your results (4-5 days)  - Avoid travel on public transportation if possible (such as bus, train, or airplane) or o Sent to the Emergency Department by EMS for evaluation, COVID-19 testing, and possible  admission depending on your condition and test results.  What to do if you are LOW RISK for COVID-19?  Reduce your risk of any infection by using the same precautions used for avoiding the common cold or flu:  Marland Kitchen Wash your hands often with soap and warm water for at least 20 seconds.  If soap and water are not readily available, use an alcohol-based hand sanitizer with at least 60% alcohol.  . If coughing or sneezing, cover your mouth and nose by coughing or sneezing into the elbow areas of your shirt or coat, into a tissue or into your sleeve (not your hands). . Avoid shaking hands with others and consider head nods or verbal greetings only. . Avoid touching your eyes, nose, or mouth with unwashed hands.  . Avoid close contact with people who are Cynthia Harrell. . Avoid places or events with large numbers of people in one location, like concerts or sporting events. . Carefully consider travel plans you have or are making. . If you are planning any travel outside or inside the [place], visit the CDC's Travelers' Health webpage for the latest health notices. . If you have some symptoms but not all symptoms, continue to monitor at home and seek medical attention if your symptoms worsen. . If you are having a medical emergency, call 911.  01/21/19 SCREENING NEG SLS ADDITIONAL HEALTHCARE OPTIONS FOR PATIENTS  [facility] / e-Visit: [email or web address]         [facility]: [phone]  [facility]: [phone]                   [facility]: [phone]  

## 2019-01-22 ENCOUNTER — Other Ambulatory Visit (INDEPENDENT_AMBULATORY_CARE_PROVIDER_SITE_OTHER): Payer: Medicaid Other

## 2019-01-22 ENCOUNTER — Other Ambulatory Visit: Payer: Self-pay

## 2019-01-22 ENCOUNTER — Encounter: Payer: Self-pay | Admitting: Obstetrics and Gynecology

## 2019-01-22 ENCOUNTER — Ambulatory Visit (INDEPENDENT_AMBULATORY_CARE_PROVIDER_SITE_OTHER): Payer: Medicaid Other | Admitting: Obstetrics and Gynecology

## 2019-01-22 VITALS — BP 128/84 | HR 96 | Ht 66.0 in | Wt 171.1 lb

## 2019-01-22 DIAGNOSIS — O26851 Spotting complicating pregnancy, first trimester: Secondary | ICD-10-CM

## 2019-01-22 DIAGNOSIS — N926 Irregular menstruation, unspecified: Secondary | ICD-10-CM | POA: Diagnosis not present

## 2019-01-22 DIAGNOSIS — Z3687 Encounter for antenatal screening for uncertain dates: Secondary | ICD-10-CM

## 2019-01-22 LAB — POCT URINE PREGNANCY: Preg Test, Ur: POSITIVE — AB

## 2019-01-22 NOTE — Progress Notes (Signed)
  Subjective:     Patient ID: Cynthia Harrell, female   DOB: [birth year], 33 y.o.   MRN: [id number]  HPI Here for pregnancy confirmation, reporting LMP 11/17/2018, giving EDC 08/24/2019 & EGA 9w 3d.  Reports spotting at the end of March, very light and had +home UPT. Then started spotting this weekend with cramping.   Review of Systems     Objective:   Physical Exam A&Ox4 Well groomed female in no distress Blood pressure 128/84, pulse 96, height 5\' 6"  (1.676 m), weight 171 lb 1.6 oz (77.6 kg), last menstrual period 11/15/2018, not currently breastfeeding. UPT+  Today's Ultrasound Findings:  Singleton intrauterine pregnancy is visualized with a CRL consistent with [redacted]w[redacted]d gestation, giving an (U/S) EDD of 08/22/2019. The (U/S) EDD is consistent with the clinically established EDD of 08/24/2019.  FHR: 155 BPM CRL measurement: 34.8 mm Yolk sac is visualized and appears normal and early anatomy is normal. Amnion: visualized and appears normal   Right Ovary is normal in appearance. Left Ovary is normal appearance. Corpus luteal cyst:  is not visualized Survey of the adnexa demonstrates no adnexal masses. There is no free peritoneal fluid in the cul de sac.  Impression: 1. [redacted]w[redacted]d Viable Singleton Intrauterine pregnancy by U/S. 2. (U/S) EDD is consistent with Clinically established EDD of 08/24/2019.    Assessment:     Missed menses Spotting in the first trimester    Plan:     Prenatal panel obtained and viability scan done today, will return in 2 weeks for New OB PE with me. Congratulated on pregnancy. Reassured on implantation spotting and it looks normal.    Melody Shambley,CNM

## 2019-01-23 LAB — VARICELLA ZOSTER ANTIBODY, IGG: Varicella zoster IgG: 1610 index (ref >165)

## 2019-01-23 LAB — ANTIBODY SCREEN: Antibody Screen: NEGATIVE

## 2019-01-23 LAB — ABO AND RH: Rh Factor: POSITIVE

## 2019-01-23 LAB — HEPATITIS B SURFACE ANTIGEN: Hepatitis B Surface Ag: NEGATIVE

## 2019-01-23 LAB — HIV ANTIBODY (ROUTINE TESTING W REFLEX): HIV Screen 4th Generation wRfx: NONREACTIVE

## 2019-01-23 LAB — RUBELLA SCREEN: Rubella Antibodies, IGG: 5.54 index (ref >0.99)

## 2019-01-23 LAB — HGB SOLU + RFLX FRAC: Sickle Solubility Test - HGBRFX: NEGATIVE

## 2019-01-23 LAB — RPR: RPR Ser Ql: NONREACTIVE

## 2019-02-04 ENCOUNTER — Telehealth: Payer: Self-pay

## 2019-02-04 NOTE — Telephone Encounter (Signed)
Coronavirus (COVID-19) Are you at risk?  Are you at risk for the Coronavirus (COVID-19)?  To be considered HIGH RISK for Coronavirus (COVID-19), you have to meet the following criteria:  . Traveled to [place], [place], [place], [place] or [place]; or in the [place] to [place], [place], [place], or [place]; and have fever, cough, and shortness of breath within the last 2 weeks of travel OR . Been in close contact with a person diagnosed with COVID-19 within the last 2 weeks and have fever, cough, and shortness of breath . IF YOU DO NOT MEET THESE CRITERIA, YOU ARE CONSIDERED LOW RISK FOR COVID-19.  What to do if you are HIGH RISK for COVID-19?  Marland Kitchen If you are having a medical emergency, call 911. . Seek medical care right away. Before you go to a doctor's office, urgent care or emergency department, call ahead and tell them about your recent travel, contact with someone diagnosed with COVID-19, and your symptoms. You should receive instructions from your physician's office regarding next steps of care.  . When you arrive at healthcare provider, tell the healthcare staff immediately you have returned from visiting [place], [place], [place], [place] or [place]; or traveled in the [place] to [place], [place], [place], or [place]; in the last two weeks or you have been in close contact with a person diagnosed with COVID-19 in the last 2 weeks.   . Tell the health care staff about your symptoms: fever, cough and shortness of breath. . After you have been seen by a medical provider, you will be either: o Tested for (COVID-19) and discharged home on quarantine except to seek medical care if symptoms worsen, and asked to  - Stay home and avoid contact with others until you get your results (4-5 days)  - Avoid travel on public transportation if possible (such as bus, train, or airplane) or o Sent to the Emergency Department by EMS for evaluation, COVID-19 testing, and possible  admission depending on your condition and test results.  What to do if you are LOW RISK for COVID-19?  Reduce your risk of any infection by using the same precautions used for avoiding the common cold or flu:  Marland Kitchen Wash your hands often with soap and warm water for at least 20 seconds.  If soap and water are not readily available, use an alcohol-based hand sanitizer with at least 60% alcohol.  . If coughing or sneezing, cover your mouth and nose by coughing or sneezing into the elbow areas of your shirt or coat, into a tissue or into your sleeve (not your hands). . Avoid shaking hands with others and consider head nods or verbal greetings only. . Avoid touching your eyes, nose, or mouth with unwashed hands.  . Avoid close contact with people who are Dail Lerew. . Avoid places or events with large numbers of people in one location, like concerts or sporting events. . Carefully consider travel plans you have or are making. . If you are planning any travel outside or inside the [place], visit the CDC's Travelers' Health webpage for the latest health notices. . If you have some symptoms but not all symptoms, continue to monitor at home and seek medical attention if your symptoms worsen. . If you are having a medical emergency, call 911.  02/04/19 SCREENING NEG SLS ADDITIONAL HEALTHCARE OPTIONS FOR PATIENTS  [facility] / e-Visit: [email or web address]         [facility]: [phone]  [facility]: [phone]                   [facility]: [phone]  

## 2019-02-05 ENCOUNTER — Other Ambulatory Visit: Payer: Self-pay

## 2019-02-05 ENCOUNTER — Encounter: Payer: Self-pay | Admitting: Obstetrics and Gynecology

## 2019-02-05 ENCOUNTER — Ambulatory Visit (INDEPENDENT_AMBULATORY_CARE_PROVIDER_SITE_OTHER): Payer: Medicaid Other | Admitting: Obstetrics and Gynecology

## 2019-02-05 VITALS — BP 128/78 | HR 99 | Wt 170.3 lb

## 2019-02-05 DIAGNOSIS — Z3483 Encounter for supervision of other normal pregnancy, third trimester: Secondary | ICD-10-CM

## 2019-02-05 MED ORDER — CITRANATAL BLOOM 90-1 MG PO TABS: 1.0000 | ORAL_TABLET | Freq: Every day | ORAL | 11 refills | Status: DC

## 2019-02-05 NOTE — Patient Instructions (Signed)

## 2019-02-05 NOTE — Progress Notes (Signed)
NEW OB HISTORY AND PHYSICAL  SUBJECTIVE:       Cynthia Harrell is a 33 y.o. [phone] female, Patient's last menstrual period was 11/15/2018., Estimated Date of Delivery: 08/24/19, [redacted]w[redacted]d, presents today for establishment of Prenatal Care. She has no unusual complaints      Gynecologic History Patient's last menstrual period was 11/15/2018. Normal Contraception: none Last Pap: 08/2018. Results were: normal  Obstetric History OB History  Gravida Para Term Preterm AB Living  4 2 2   1 2   SAB TAB Ectopic Multiple Live Births    1   0 2    # Outcome Date GA Lbr Len/2nd Weight Sex Delivery Anes PTL Lv  4 Current           3 Term 11/06/17 106w4d / 00:43 9 lb 4.9 oz (4.22 kg) F Vag-Spont EPI  LIV  2 Term 12/04/06 [redacted]w[redacted]d  8 lb 1 oz (3.657 kg) F Vag-Spont EPI N LIV  1 TAB      TAB       Past Medical History:  Diagnosis Date  . 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 High-risk sexual behavior   . Microalbuminuria     Past Surgical History:  Procedure Laterality Date  . THERAPEUTIC ABORTION  2008    Current Outpatient Medications on File Prior to Visit  Medication Sig Dispense Refill  . Prenatal Vit-Fe Fumarate-FA (PRENATAL MULTIVITAMIN) TABS tablet Take 1 tablet by mouth daily at 12 noon.    . valACYclovir (VALTREX) 500 MG tablet Take 1 tablet (500 mg total) by mouth as needed. 60 tablet 6   No current facility-administered medications on file prior to visit.     No Known Allergies  Social History   Socioeconomic History  . Marital status: Single    Spouse name: Not on file  . Number of children: Not on file  . Years of education: Not on file  . Highest education level: Not on file  Occupational History  . Not on file  Social Needs  . Financial resource strain: Not on file  . Food insecurity:    Worry: Not on file    Inability: Not on file  . Transportation needs:    Medical: Not on file    Non-medical: Not on file  Tobacco Use  . Smoking status: Never Smoker  .  Smokeless tobacco: Never Used  Substance and Sexual Activity  . Alcohol use: No    Alcohol/week: 0.0 standard drinks  . Drug use: No  . Sexual activity: Yes  Lifestyle  . Physical activity:    Days per week: Not on file    Minutes per session: Not on file  . Stress: Not on file  Relationships  . Social connections:    Talks on phone: Not on file    Gets together: Not on file    Attends religious service: Not on file    Active member of club or organization: Not on file    Attends meetings of clubs or organizations: Not on file    Relationship status: Not on file  . Intimate partner violence:    Fear of current or ex partner: Not on file    Emotionally abused: Not on file    Physically abused: Not on file    Forced sexual activity: Not on file  Other Topics Concern  . Not on file  Social History Narrative  . Not on file    Family History  Problem Relation Age of Onset  .  Diabetes Father   . Hypertension Mother   . Cancer Mother        melanoma  . Stroke Maternal Grandfather   . Stroke Paternal Grandfather   . COPD Neg Hx   . Heart disease Neg Hx     The following portions of the patient's history were reviewed and updated as appropriate: allergies, current medications, past OB history, past medical history, past surgical history, past family history, past social history, and problem list.    OBJECTIVE: Initial Physical Exam (New OB)  GENERAL APPEARANCE: alert, well appearing, in no apparent distress, oriented to person, place and time HEAD: normocephalic, atraumatic MOUTH: mucous membranes moist, pharynx normal without lesions and dental hygiene good THYROID: no thyromegaly or masses present BREASTS: not examined LUNGS: clear to auscultation, no wheezes, rales or rhonchi, symmetric air entry HEART: regular rate and rhythm, no murmurs ABDOMEN: soft, nontender, nondistended, no abnormal masses, no epigastric pain, fundus not palpable and FHT present EXTREMITIES:  no redness or tenderness in the calves or thighs SKIN: normal coloration and turgor, no rashes LYMPH NODES: no adenopathy palpable NEUROLOGIC: alert, oriented, normal speech, no focal findings or movement disorder noted  PELVIC EXAM deferred  ASSESSMENT: Normal pregnancy BMI 27  PLAN: Prenatal care Desires genetic screening- panarama obtained today See orders

## 2019-02-05 NOTE — Progress Notes (Signed)
NOB PE- pt is doing well, desires genetic testing

## 2019-02-06 LAB — CBC WITH DIFFERENTIAL/PLATELET
Basophils Absolute: 0 10*3/uL (ref 0.0–0.2)
Basos: 0 %
EOS (ABSOLUTE): 0 10*3/uL (ref 0.0–0.4)
Eos: 0 %
Hematocrit: 35 % (ref 34.0–46.6)
Hemoglobin: 12 g/dL (ref 11.1–15.9)
Immature Grans (Abs): 0 10*3/uL (ref 0.0–0.1)
Immature Granulocytes: 0 %
Lymphocytes Absolute: 1.3 10*3/uL (ref 0.7–3.1)
Lymphs: 15 %
MCH: 31.3 pg (ref 26.6–33.0)
MCHC: 34.3 g/dL (ref 31.5–35.7)
MCV: 91 fL (ref 79–97)
Monocytes Absolute: 0.5 10*3/uL (ref 0.1–0.9)
Monocytes: 6 %
Neutrophils Absolute: 6.9 10*3/uL (ref 1.4–7.0)
Neutrophils: 79 %
Platelets: 215 10*3/uL (ref 150–450)
RBC: 3.83 x10E6/uL (ref 3.77–5.28)
RDW: 13.1 % (ref 11.7–15.4)
WBC: 8.8 10*3/uL (ref 3.4–10.8)

## 2019-03-05 ENCOUNTER — Ambulatory Visit (INDEPENDENT_AMBULATORY_CARE_PROVIDER_SITE_OTHER): Payer: Medicaid Other | Admitting: Certified Nurse Midwife

## 2019-03-05 ENCOUNTER — Other Ambulatory Visit: Payer: Self-pay

## 2019-03-05 VITALS — BP 109/67 | HR 83 | Wt 170.9 lb

## 2019-03-05 DIAGNOSIS — Z3492 Encounter for supervision of normal pregnancy, unspecified, second trimester: Secondary | ICD-10-CM | POA: Diagnosis not present

## 2019-03-05 DIAGNOSIS — Z3689 Encounter for other specified antenatal screening: Secondary | ICD-10-CM

## 2019-03-05 LAB — POCT URINALYSIS DIPSTICK OB
Bilirubin, UA: NEGATIVE
Blood, UA: NEGATIVE
Glucose, UA: NEGATIVE
Leukocytes, UA: NEGATIVE
Nitrite, UA: NEGATIVE
POC,PROTEIN,UA: NEGATIVE
Spec Grav, UA: 1.02 (ref 1.010–1.025)
Urobilinogen, UA: 0.2 E.U./dL
pH, UA: 6.5 (ref 5.0–8.0)

## 2019-03-05 NOTE — Progress Notes (Signed)
ROB-No complaints.

## 2019-03-05 NOTE — Patient Instructions (Signed)
Second Trimester of Pregnancy  The second trimester is from week 14 through week 27 (month 4 through 6). This is often the time in pregnancy that you feel your best. Often times, morning sickness has lessened or quit. You may have more energy, and you may get hungry more often. Your unborn baby is growing rapidly. At the end of the sixth month, he or she is about 9 inches long and weighs about 1 pounds. You will likely feel the baby move between 18 and 20 weeks of pregnancy. Follow these instructions at home: Medicines  Take over-the-counter and prescription medicines only as told by your doctor. Some medicines are safe and some medicines are not safe during pregnancy.  Take a prenatal vitamin that contains at least 600 micrograms (mcg) of folic acid.  If you have trouble pooping (constipation), take medicine that will make your stool soft (stool softener) if your doctor approves. Eating and drinking   Eat regular, healthy meals.  Avoid raw meat and uncooked cheese.  If you get low calcium from the food you eat, talk to your doctor about taking a daily calcium supplement.  Avoid foods that are high in fat and sugars, such as fried and sweet foods.  If you feel sick to your stomach (nauseous) or throw up (vomit): ? Eat 4 or 5 small meals a day instead of 3 large meals. ? Try eating a few soda crackers. ? Drink liquids between meals instead of during meals.  To prevent constipation: ? Eat foods that are high in fiber, like fresh fruits and vegetables, whole grains, and beans. ? Drink enough fluids to keep your pee (urine) clear or pale yellow. Activity  Exercise only as told by your doctor. Stop exercising if you start to have cramps.  Do not exercise if it is too hot, too humid, or if you are in a place of great height (high altitude).  Avoid heavy lifting.  Wear low-heeled shoes. Sit and stand up straight.  You can continue to have sex unless your doctor tells you not to.  Relieving pain and discomfort  Wear a good support bra if your breasts are tender.  Take warm water baths (sitz baths) to soothe pain or discomfort caused by hemorrhoids. Use hemorrhoid cream if your doctor approves.  Rest with your legs raised if you have leg cramps or low back pain.  If you develop puffy, bulging veins (varicose veins) in your legs: ? Wear support hose or compression stockings as told by your doctor. ? Raise (elevate) your feet for 15 minutes, 3-4 times a day. ? Limit salt in your food. Prenatal care  Write down your questions. Take them to your prenatal visits.  Keep all your prenatal visits as told by your doctor. This is important. Safety  Wear your seat belt when driving.  Make a list of emergency phone numbers, including numbers for family, friends, the hospital, and police and fire departments. General instructions  Ask your doctor about the right foods to eat or for help finding a counselor, if you need these services.  Ask your doctor about local prenatal classes. Begin classes before month 6 of your pregnancy.  Do not use hot tubs, steam rooms, or saunas.  Do not douche or use tampons or scented sanitary pads.  Do not cross your legs for long periods of time.  Visit your dentist if you have not done so. Use a soft toothbrush to brush your teeth. Floss gently.  Avoid all smoking, herbs,   and alcohol. Avoid drugs that are not approved by your doctor.  Do not use any products that contain nicotine or tobacco, such as cigarettes and e-cigarettes. If you need help quitting, ask your doctor.  Avoid cat litter boxes and soil used by cats. These carry germs that can cause birth defects in the baby and can cause a loss of your baby (miscarriage) or stillbirth. Contact a doctor if:  You have mild cramps or pressure in your lower belly.  You have pain when you pee (urinate).  You have bad smelling fluid coming from your vagina.  You continue to feel  sick to your stomach (nauseous), throw up (vomit), or have watery poop (diarrhea).  You have a nagging pain in your belly area.  You feel dizzy. Get help right away if:  You have a fever.  You are leaking fluid from your vagina.  You have spotting or bleeding from your vagina.  You have severe belly cramping or pain.  You lose or gain weight rapidly.  You have trouble catching your breath and have chest pain.  You notice sudden or extreme puffiness (swelling) of your face, hands, ankles, feet, or legs.  You have not felt the baby move in over an hour.  You have severe headaches that do not go away when you take medicine.  You have trouble seeing. Summary  The second trimester is from week 14 through week 27 (months 4 through 6). This is often the time in pregnancy that you feel your best.  To take care of yourself and your unborn baby, you will need to eat healthy meals, take medicines only if your doctor tells you to do so, and do activities that are safe for you and your baby.  Call your doctor if you get sick or if you notice anything unusual about your pregnancy. Also, call your doctor if you need help with the right food to eat, or if you want to know what activities are safe for you. This information is not intended to replace advice given to you by your health care provider. Make sure you discuss any questions you have with your health care provider. Document Released: 11/30/2009 Document Revised: 10/11/2016 Document Reviewed: 10/11/2016 Elsevier Interactive Patient Education  2019 [employer] Back Pain in Pregnancy Back pain during pregnancy is common. Back pain may be caused by several factors that are related to changes during your pregnancy. Follow these instructions at home: Managing pain, stiffness, and swelling      If directed, for sudden (acute) back pain, put ice on the painful area. ? Put ice in a plastic bag. ? Place a towel between your skin and  the bag. ? Leave the ice on for 20 minutes, 2-3 times per day.  If directed, apply heat to the affected area before you exercise. Use the heat source that your health care provider recommends, such as a moist heat pack or a heating pad. ? Place a towel between your skin and the heat source. ? Leave the heat on for 20-30 minutes. ? Remove the heat if your skin turns bright red. This is especially important if you are unable to feel pain, heat, or cold. You may have a greater risk of getting burned.  If directed, massage the affected area. Activity  Exercise as told by your health care provider. Gentle exercise is the best way to prevent or manage back pain.  Listen to your body when lifting. If lifting hurts, ask for help or  bend your knees. This uses your leg muscles instead of your back muscles.  Squat down when picking up something from the floor. Do not bend over.  Only use bed rest for short periods as told by your health care provider. Bed rest should only be used for the most severe episodes of back pain. Standing, sitting, and lying down  Do not stand in one place for long periods of time.  Use good posture when sitting. Make sure your head rests over your shoulders and is not hanging forward. Use a pillow on your lower back if necessary.  Try sleeping on your side, preferably the left side, with a pregnancy support pillow or 1-2 regular pillows between your legs. ? If you have back pain after a night's rest, your bed may be too soft. ? A firm mattress may provide more support for your back during pregnancy. General instructions  Do not wear high heels.  Eat a healthy diet. Try to gain weight within your health care provider's recommendations.  Use a maternity girdle, elastic sling, or back brace as told by your health care provider.  Take over-the-counter and prescription medicines only as told by your health care provider.  Work with a physical therapist or massage  therapist to find ways to manage back pain. Acupuncture or massage therapy may be helpful.  Keep all follow-up visits as told by your health care provider. This is important. Contact a health care provider if:  Your back pain interferes with your daily activities.  You have increasing pain in other parts of your body. Get help right away if:  You develop numbness, tingling, weakness, or problems with the use of your arms or legs.  You develop severe back pain that is not controlled with medicine.  You have a change in bowel or bladder control.  You develop shortness of breath, dizziness, or you faint.  You develop nausea, vomiting, or sweating.  You have back pain that is a rhythmic, cramping pain similar to labor pains. Labor pain is usually 1-2 minutes apart, lasts for about 1 minute, and involves a bearing down feeling or pressure in your pelvis.  You have back pain and your water breaks or you have vaginal bleeding.  You have back pain or numbness that travels down your leg.  Your back pain developed after you fell.  You develop pain on one side of your back.  You see blood in your urine.  You develop skin blisters in the area of your back pain. Summary  Back pain may be caused by several factors that are related to changes during your pregnancy.  Follow instructions as told by your health care provider for managing pain, stiffness, and swelling.  Exercise as told by your health care provider. Gentle exercise is the best way to prevent or manage back pain.  Take over-the-counter and prescription medicines only as told by your health care provider.  Keep all follow-up visits as told by your health care provider. This is important. This information is not intended to replace advice given to you by your health care provider. Make sure you discuss any questions you have with your health care provider. Document Released: 12/14/2005 Document Revised: 02/21/2018 Document  Reviewed: 02/21/2018 Elsevier Interactive Patient Education  2019 [place]. Round Ligament Pain  The round ligament is a cord of muscle and tissue that helps support the uterus. It can become a source of pain during pregnancy if it becomes stretched or twisted as the baby  grows. The pain usually begins in the second trimester (13-28 weeks) of pregnancy, and it can come and go until the baby is delivered. It is not a serious problem, and it does not cause harm to the baby. Round ligament pain is usually a short, sharp, and pinching pain, but it can also be a dull, lingering, and aching pain. The pain is felt in the lower side of the abdomen or in the groin. It usually starts deep in the groin and moves up to the outside of the hip area. The pain may occur when you:  Suddenly change position, such as quickly going from a sitting to standing position.  Roll over in bed.  Cough or sneeze.  Do physical activity. Follow these instructions at home:   Watch your condition for any changes.  When the pain starts, relax. Then try any of these methods to help with the pain: ? Sitting down. ? Flexing your knees up to your abdomen. ? Lying on your side with one pillow under your abdomen and another pillow between your legs. ? Sitting in a warm bath for 15-20 minutes or until the pain goes away.  Take over-the-counter and prescription medicines only as told by your health care provider.  Move slowly when you sit down or stand up.  Avoid long walks if they cause pain.  Stop or reduce your physical activities if they cause pain.  Keep all follow-up visits as told by your health care provider. This is important. Contact a health care provider if:  Your pain does not go away with treatment.  You feel pain in your back that you did not have before.  Your medicine is not helping. Get help right away if:  You have a fever or chills.  You develop uterine contractions.  You have vaginal  bleeding.  You have nausea or vomiting.  You have diarrhea.  You have pain when you urinate. Summary  Round ligament pain is felt in the lower abdomen or groin. It is usually a short, sharp, and pinching pain. It can also be a dull, lingering, and aching pain.  This pain usually begins in the second trimester (13-28 weeks). It occurs because the uterus is stretching with the growing baby, and it is not harmful to the baby.  You may notice the pain when you suddenly change position, when you cough or sneeze, or during physical activity.  Relaxing, flexing your knees to your abdomen, lying on one side, or taking a warm bath may help to get rid of the pain.  Get help from your health care provider if the pain does not go away or if you have vaginal bleeding, nausea, vomiting, diarrhea, or painful urination. This information is not intended to replace advice given to you by your health care provider. Make sure you discuss any questions you have with your health care provider. Document Released: 06/14/2008 Document Revised: 02/21/2018 Document Reviewed: 02/21/2018 Elsevier Interactive Patient Education  2019 [employer].

## 2019-03-05 NOTE — Progress Notes (Signed)
ROB-Doing well. 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 Work note given. Encouraged use of abdominal support and compression stockings while working. Declines AFP. Anticipatory guidance regarding course of prenatal care. Pregnancy Medicaid Home Form completed. Reviewed red flag symptoms and when to call. RTC x 4-5 weeks for ANATOMY SCAN and ROB or sooner if needed.

## 2019-03-12 ENCOUNTER — Telehealth: Payer: Self-pay | Admitting: Obstetrics and Gynecology

## 2019-03-12 NOTE — Telephone Encounter (Signed)
Patient called stating she thinks she still may be dehydrated. She has complaints of headache and blurred vision. She states she is drinking water but not a lot and has not really had any protein,  mostly pasta and bread. Please advise

## 2019-03-13 NOTE — Telephone Encounter (Signed)
Called pt we discussed her headache, states she took some tylenol with relief, advised pt if headache persist call office make appt, pt voiced understanding

## 2019-04-02 ENCOUNTER — Ambulatory Visit (INDEPENDENT_AMBULATORY_CARE_PROVIDER_SITE_OTHER): Payer: Medicaid Other | Admitting: Certified Nurse Midwife

## 2019-04-02 ENCOUNTER — Encounter: Payer: Self-pay | Admitting: Certified Nurse Midwife

## 2019-04-02 ENCOUNTER — Ambulatory Visit (INDEPENDENT_AMBULATORY_CARE_PROVIDER_SITE_OTHER): Payer: Medicaid Other

## 2019-04-02 ENCOUNTER — Other Ambulatory Visit: Payer: Self-pay

## 2019-04-02 VITALS — BP 119/64 | HR 81 | Wt 174.1 lb

## 2019-04-02 DIAGNOSIS — Z3A19 19 weeks gestation of pregnancy: Secondary | ICD-10-CM

## 2019-04-02 DIAGNOSIS — Z3689 Encounter for other specified antenatal screening: Secondary | ICD-10-CM | POA: Diagnosis not present

## 2019-04-02 DIAGNOSIS — Z3492 Encounter for supervision of normal pregnancy, unspecified, second trimester: Secondary | ICD-10-CM | POA: Diagnosis not present

## 2019-04-02 LAB — POCT URINALYSIS DIPSTICK OB
Bilirubin, UA: NEGATIVE
Blood, UA: NEGATIVE
Glucose, UA: NEGATIVE
Ketones, UA: NEGATIVE
Leukocytes, UA: NEGATIVE
Nitrite, UA: NEGATIVE
POC,PROTEIN,UA: NEGATIVE
Spec Grav, UA: 1.01 (ref 1.010–1.025)
Urobilinogen, UA: 0.2 E.U./dL
pH, UA: 6.5 (ref 5.0–8.0)

## 2019-04-02 NOTE — Patient Instructions (Signed)
Round Ligament Pain  The round ligament is a cord of muscle and tissue that helps support the uterus. It can become a source of pain during pregnancy if it becomes stretched or twisted as the baby grows. The pain usually begins in the second trimester (13-28 weeks) of pregnancy, and it can come and go until the baby is delivered. It is not a serious problem, and it does not cause harm to the baby. Round ligament pain is usually a short, sharp, and pinching pain, but it can also be a dull, lingering, and aching pain. The pain is felt in the lower side of the abdomen or in the groin. It usually starts deep in the groin and moves up to the outside of the hip area. The pain may occur when you:  Suddenly change position, such as quickly going from a sitting to standing position.  Roll over in bed.  Cough or sneeze.  Do physical activity. Follow these instructions at home:   Watch your condition for any changes.  When the pain starts, relax. Then try any of these methods to help with the pain: ? Sitting down. ? Flexing your knees up to your abdomen. ? Lying on your side with one pillow under your abdomen and another pillow between your legs. ? Sitting in a warm bath for 15-20 minutes or until the pain goes away.  Take over-the-counter and prescription medicines only as told by your health care provider.  Move slowly when you sit down or stand up.  Avoid long walks if they cause pain.  Stop or reduce your physical activities if they cause pain.  Keep all follow-up visits as told by your health care provider. This is important. Contact a health care provider if:  Your pain does not go away with treatment.  You feel pain in your back that you did not have before.  Your medicine is not helping. Get help right away if:  You have a fever or chills.  You develop uterine contractions.  You have vaginal bleeding.  You have nausea or vomiting.  You have diarrhea.  You have pain  when you urinate. Summary  Round ligament pain is felt in the lower abdomen or groin. It is usually a short, sharp, and pinching pain. It can also be a dull, lingering, and aching pain.  This pain usually begins in the second trimester (13-28 weeks). It occurs because the uterus is stretching with the growing baby, and it is not harmful to the baby.  You may notice the pain when you suddenly change position, when you cough or sneeze, or during physical activity.  Relaxing, flexing your knees to your abdomen, lying on one side, or taking a warm bath may help to get rid of the pain.  Get help from your health care provider if the pain does not go away or if you have vaginal bleeding, nausea, vomiting, diarrhea, or painful urination. This information is not intended to replace advice given to you by your health care provider. Make sure you discuss any questions you have with your health care provider. Document Released: 06/14/2008 Document Revised: 02/21/2018 Document Reviewed: 02/21/2018 Elsevier Patient Education  2020 [employer].

## 2019-04-02 NOTE — Progress Notes (Signed)
Cynthia Harrell doing well. Anatomy scan today. Results reviewed . Rt chorplex cyst noted. Reviewed with pt. Follow up u/s next visit. Pt verbalizes and agree to plan.    Patient Name: Cynthia Harrell DOB: 12-19-1985 MRN: [id number] ULTRASOUND REPORT  Location: [facility] Date of Service: 04/02/2019   Indications:Anatomy Ultrasound Findings:  Cynthia Harrell intrauterine pregnancy is visualized with FHR at 142 BPM. Biometrics give an (U/S) Gestational age of [redacted]w[redacted]d and an (U/S) EDD of 08/14/2019; this correlates with the clinically established Estimated Date of Delivery: 08/24/19  Fetal presentation is Cephalic.  EFW: 387 g ( 14 oz ). Placenta: anterior. Grade: 1 AFI: subjectively normal.  Anatomic survey is complete and normal; Gender - female.    Right Ovary is normal in appearance. Left Ovary is normal appearance. Survey of the adnexa demonstrates no adnexal masses. There is no free peritoneal fluid in the cul de sac.  Impression: 1. [redacted]w[redacted]d Viable Singleton Intrauterine pregnancy by U/S. 2. (U/S) EDD is consistent with Clinically established Estimated Date of Delivery: 08/24/19 . 3. Normal Anatomy Scan 4. Rt Chorplex cyst measuring 5 mm.  Recommendations:   1.Clinical correlation with the patient's History and Physical Exam.   Cynthia Harrell    RDMS

## 2019-04-04 ENCOUNTER — Telehealth: Payer: Self-pay | Admitting: Obstetrics and Gynecology

## 2019-04-04 ENCOUNTER — Other Ambulatory Visit: Payer: Self-pay | Admitting: *Deleted

## 2019-04-04 ENCOUNTER — Encounter: Payer: Self-pay | Admitting: Certified Nurse Midwife

## 2019-04-04 DIAGNOSIS — O3503X0 Maternal care for (suspected) central nervous system malformation or damage in fetus, choroid plexus cysts, not applicable or unspecified: Secondary | ICD-10-CM | POA: Insufficient documentation

## 2019-04-04 DIAGNOSIS — O350XX0 Maternal care for (suspected) central nervous system malformation in fetus, not applicable or unspecified: Secondary | ICD-10-CM | POA: Insufficient documentation

## 2019-04-04 MED ORDER — CITRANATAL ASSURE 35-1 & 300 MG PO MISC: 1.0000 | Freq: Every day | ORAL | 6 refills | Status: DC

## 2019-04-04 NOTE — Telephone Encounter (Signed)
Done-ac 

## 2019-04-04 NOTE — Telephone Encounter (Signed)
The patient called and stated that her prenatal vitamins that were sent in are discontinued, The patient is requesting a new prescription be sent in. Please advise.

## 2019-04-09 ENCOUNTER — Other Ambulatory Visit: Payer: Self-pay

## 2019-04-09 MED ORDER — PRENATAL VITAMINS 28-0.8 MG PO TABS: 1.0000 | ORAL_TABLET | Freq: Every day | ORAL | 4 refills | Status: DC

## 2019-04-09 NOTE — Telephone Encounter (Signed)
Script for prenatal vits sent to pharmacy.

## 2019-05-01 ENCOUNTER — Ambulatory Visit (INDEPENDENT_AMBULATORY_CARE_PROVIDER_SITE_OTHER): Payer: Medicaid Other

## 2019-05-01 ENCOUNTER — Other Ambulatory Visit: Payer: Self-pay

## 2019-05-01 ENCOUNTER — Ambulatory Visit (INDEPENDENT_AMBULATORY_CARE_PROVIDER_SITE_OTHER): Payer: Medicaid Other | Admitting: Obstetrics and Gynecology

## 2019-05-01 VITALS — BP 124/72 | HR 97 | Wt 178.6 lb

## 2019-05-01 DIAGNOSIS — Z3492 Encounter for supervision of normal pregnancy, unspecified, second trimester: Secondary | ICD-10-CM | POA: Diagnosis not present

## 2019-05-01 DIAGNOSIS — O9989 Other specified diseases and conditions complicating pregnancy, childbirth and the puerperium: Secondary | ICD-10-CM

## 2019-05-01 DIAGNOSIS — Z3A23 23 weeks gestation of pregnancy: Secondary | ICD-10-CM

## 2019-05-01 DIAGNOSIS — R319 Hematuria, unspecified: Secondary | ICD-10-CM

## 2019-05-01 DIAGNOSIS — N390 Urinary tract infection, site not specified: Secondary | ICD-10-CM

## 2019-05-01 LAB — POCT URINALYSIS DIPSTICK OB
Bilirubin, UA: NEGATIVE
Blood, UA: NEGATIVE
Glucose, UA: NEGATIVE
Ketones, UA: NEGATIVE
Leukocytes, UA: NEGATIVE
Nitrite, UA: NEGATIVE
POC,PROTEIN,UA: NEGATIVE
Spec Grav, UA: 1.015 (ref 1.010–1.025)
Urobilinogen, UA: 0.2 E.U./dL
pH, UA: 6.5 (ref 5.0–8.0)

## 2019-05-01 MED ORDER — VALACYCLOVIR HCL 500 MG PO TABS: 500.0000 mg | ORAL_TABLET | ORAL | 6 refills | Status: DC | PRN

## 2019-05-01 NOTE — Progress Notes (Signed)
Here for ROB and follow up ultrasound- reviewed normal u/s findings below: Indications: F/U  Choroid plexus cyst. Findings:  Singleton intrauterine pregnancy is visualized with FHR at 145 BPM.  Fetal presentation is Variable.  Placenta: anterior. Grade: 1 AFI: subjectively normal.  Anatomic survey is complete.    There is no free peritoneal fluid in the cul de sac.  Impression: 1. [redacted]w[redacted]d Viable Singleton Intrauterine pregnancy previously established criteria. 2. F/U Rt Choroid plexus cysts that was seen on last ultrasound exam on 04/03/2019 was not seen at this time.   Will RTC in 4 weeks for glucola nd ROB.

## 2019-05-01 NOTE — Progress Notes (Signed)
ROB- follow up US done today, pt is doing well

## 2019-05-16 ENCOUNTER — Other Ambulatory Visit: Payer: Self-pay | Admitting: Obstetrics and Gynecology

## 2019-05-16 DIAGNOSIS — N390 Urinary tract infection, site not specified: Secondary | ICD-10-CM

## 2019-05-31 ENCOUNTER — Other Ambulatory Visit: Payer: Medicaid Other

## 2019-05-31 ENCOUNTER — Other Ambulatory Visit: Payer: Self-pay

## 2019-05-31 ENCOUNTER — Ambulatory Visit (INDEPENDENT_AMBULATORY_CARE_PROVIDER_SITE_OTHER): Payer: Medicaid Other | Admitting: Certified Nurse Midwife

## 2019-05-31 VITALS — BP 107/66 | HR 94 | Wt 183.0 lb

## 2019-05-31 DIAGNOSIS — R8290 Unspecified abnormal findings in urine: Secondary | ICD-10-CM

## 2019-05-31 DIAGNOSIS — O26899 Other specified pregnancy related conditions, unspecified trimester: Secondary | ICD-10-CM | POA: Insufficient documentation

## 2019-05-31 DIAGNOSIS — Z130 Encounter for screening for diseases of the blood and blood-forming organs and certain disorders involving the immune mechanism: Secondary | ICD-10-CM | POA: Diagnosis not present

## 2019-05-31 DIAGNOSIS — Z131 Encounter for screening for diabetes mellitus: Secondary | ICD-10-CM

## 2019-05-31 DIAGNOSIS — Z3492 Encounter for supervision of normal pregnancy, unspecified, second trimester: Secondary | ICD-10-CM

## 2019-05-31 DIAGNOSIS — Z23 Encounter for immunization: Secondary | ICD-10-CM | POA: Diagnosis not present

## 2019-05-31 DIAGNOSIS — R102 Pelvic and perineal pain: Secondary | ICD-10-CM

## 2019-05-31 DIAGNOSIS — Z3A27 27 weeks gestation of pregnancy: Secondary | ICD-10-CM

## 2019-05-31 DIAGNOSIS — O26892 Other specified pregnancy related conditions, second trimester: Secondary | ICD-10-CM

## 2019-05-31 LAB — POCT URINALYSIS DIPSTICK OB
Bilirubin, UA: NEGATIVE
Blood, UA: NEGATIVE
Glucose, UA: NEGATIVE
Ketones, UA: NEGATIVE
Nitrite, UA: NEGATIVE
POC,PROTEIN,UA: NEGATIVE
Spec Grav, UA: 1.015 (ref 1.010–1.025)
Urobilinogen, UA: 0.2 E.U./dL
pH, UA: 7.5 (ref 5.0–8.0)

## 2019-05-31 MED ORDER — TETANUS-DIPHTH-ACELL PERTUSSIS 5-2.5-18.5 LF-MCG/0.5 IM SUSP
0.5000 mL | Freq: Once | INTRAMUSCULAR | Status: AC
  Administered 2019-05-31: 11:00:00 0.5 mL via INTRAMUSCULAR

## 2019-05-31 NOTE — Progress Notes (Signed)
ROB-Reports intermittent pelvic pressure and malodorous urine. Urine culture, see orders.  Discussed home treatment measures including use of abdominal support. 28 week labs today, see orders. Blood transfusion consent reviewed and signed. TDaP given. Completed Valtrex after recently flare due to school stress, will complete [employer] course on October 4th. Anticipatory guidance regarding the course of prenatal care. Reviewed red flag symptoms and when to call. RTC x 2 weeks for ROB or sooner if needed.

## 2019-05-31 NOTE — Patient Instructions (Signed)
WHAT OB PATIENTS CAN EXPECT   Confirmation of pregnancy and ultrasound ordered if medically indicated-[redacted] weeks gestation  New OB (NOB) intake with nurse and New OB (NOB) labs- [redacted] weeks gestation  New OB (NOB) physical examination with provider- 11/[redacted] weeks gestation  Flu vaccine-[redacted] weeks gestation  Anatomy scan-[redacted] weeks gestation  Glucose tolerance test, blood work to test for anemia, T-dap vaccine-[redacted] weeks gestation  Vaginal swabs/cultures-STD/Group B strep-[redacted] weeks gestation  Appointments every 4 weeks until 28 weeks  Every 2 weeks from 28 weeks until 36 weeks  Weekly visits from 36 weeks until delivery  Back Pain in Pregnancy Back pain during pregnancy is common. Back pain may be caused by several factors that are related to changes during your pregnancy. Follow these instructions at home: Managing pain, stiffness, and swelling      If directed, for sudden (acute) back pain, put ice on the painful area. ? Put ice in a plastic bag. ? Place a towel between your skin and the bag. ? Leave the ice on for 20 minutes, 2-3 times per day.  If directed, apply heat to the affected area before you exercise. Use the heat source that your health care provider recommends, such as a moist heat pack or a heating pad. ? Place a towel between your skin and the heat source. ? Leave the heat on for 20-30 minutes. ? Remove the heat if your skin turns bright red. This is especially important if you are unable to feel pain, heat, or cold. You may have a greater risk of getting burned.  If directed, massage the affected area. Activity  Exercise as told by your health care provider. Gentle exercise is the best way to prevent or manage back pain.  Listen to your body when lifting. If lifting hurts, ask for help or bend your knees. This uses your leg muscles instead of your back muscles.  Squat down when picking up something from the floor. Do not bend over.  Only use bed rest for short  periods as told by your health care provider. Bed rest should only be used for the most severe episodes of back pain. Standing, sitting, and lying down  Do not stand in one place for long periods of time.  Use good posture when sitting. Make sure your head rests over your shoulders and is not hanging forward. Use a pillow on your lower back if necessary.  Try sleeping on your side, preferably the left side, with a pregnancy support pillow or 1-2 regular pillows between your legs. ? If you have back pain after a night's rest, your bed may be too soft. ? A firm mattress may provide more support for your back during pregnancy. General instructions  Do not wear high heels.  Eat a healthy diet. Try to gain weight within your health care provider's recommendations.  Use a maternity girdle, elastic sling, or back brace as told by your health care provider.  Take over-the-counter and prescription medicines only as told by your health care provider.  Work with a physical therapist or massage therapist to find ways to manage back pain. Acupuncture or massage therapy may be helpful.  Keep all follow-up visits as told by your health care provider. This is important. Contact a health care provider if:  Your back pain interferes with your daily activities.  You have increasing pain in other parts of your body. Get help right away if:  You develop numbness, tingling, weakness, or problems with the use of  your arms or legs.  You develop severe back pain that is not controlled with medicine.  You have a change in bowel or bladder control.  You develop shortness of breath, dizziness, or you faint.  You develop nausea, vomiting, or sweating.  You have back pain that is a rhythmic, cramping pain similar to labor pains. Labor pain is usually 1-2 minutes apart, lasts for about 1 minute, and involves a bearing down feeling or pressure in your pelvis.  You have back pain and your water breaks or  you have vaginal bleeding.  You have back pain or numbness that travels down your leg.  Your back pain developed after you fell.  You develop pain on one side of your back.  You see blood in your urine.  You develop skin blisters in the area of your back pain. Summary  Back pain may be caused by several factors that are related to changes during your pregnancy.  Follow instructions as told by your health care provider for managing pain, stiffness, and swelling.  Exercise as told by your health care provider. Gentle exercise is the best way to prevent or manage back pain.  Take over-the-counter and prescription medicines only as told by your health care provider.  Keep all follow-up visits as told by your health care provider. This is important. This information is not intended to replace advice given to you by your health care provider. Make sure you discuss any questions you have with your health care provider. Document Released: 12/14/2005 Document Revised: 12/25/2018 Document Reviewed: 02/21/2018 Elsevier Patient Education  [place]. Round Ligament Pain  The round ligament is a cord of muscle and tissue that helps support the uterus. It can become a source of pain during pregnancy if it becomes stretched or twisted as the baby grows. The pain usually begins in the second trimester (13-28 weeks) of pregnancy, and it can come and go until the baby is delivered. It is not a serious problem, and it does not cause harm to the baby. Round ligament pain is usually a short, sharp, and pinching pain, but it can also be a dull, lingering, and aching pain. The pain is felt in the lower side of the abdomen or in the groin. It usually starts deep in the groin and moves up to the outside of the hip area. The pain may occur when you:  Suddenly change position, such as quickly going from a sitting to standing position.  Roll over in bed.  Cough or sneeze.  Do physical activity.  Follow these instructions at home:   Watch your condition for any changes.  When the pain starts, relax. Then try any of these methods to help with the pain: ? Sitting down. ? Flexing your knees up to your abdomen. ? Lying on your side with one pillow under your abdomen and another pillow between your legs. ? Sitting in a warm bath for 15-20 minutes or until the pain goes away.  Take over-the-counter and prescription medicines only as told by your health care provider.  Move slowly when you sit down or stand up.  Avoid long walks if they cause pain.  Stop or reduce your physical activities if they cause pain.  Keep all follow-up visits as told by your health care provider. This is important. Contact a health care provider if:  Your pain does not go away with treatment.  You feel pain in your back that you did not have before.  Your medicine  is not helping. Get help right away if:  You have a fever or chills.  You develop uterine contractions.  You have vaginal bleeding.  You have nausea or vomiting.  You have diarrhea.  You have pain when you urinate. Summary  Round ligament pain is felt in the lower abdomen or groin. It is usually a short, sharp, and pinching pain. It can also be a dull, lingering, and aching pain.  This pain usually begins in the second trimester (13-28 weeks). It occurs because the uterus is stretching with the growing baby, and it is not harmful to the baby.  You may notice the pain when you suddenly change position, when you cough or sneeze, or during physical activity.  Relaxing, flexing your knees to your abdomen, lying on one side, or taking a warm bath may help to get rid of the pain.  Get help from your health care provider if the pain does not go away or if you have vaginal bleeding, nausea, vomiting, diarrhea, or painful urination. This information is not intended to replace advice given to you by your health care provider. Make sure  you discuss any questions you have with your health care provider. Document Released: 06/14/2008 Document Revised: 02/21/2018 Document Reviewed: 02/21/2018 Elsevier Patient Education  2020 [employer].

## 2019-05-31 NOTE — Progress Notes (Signed)
ROB- glucola done, blood consent signed, tdap given, pt is having some pelvic pressure

## 2019-06-01 LAB — CBC
Hematocrit: 33.1 % — ABNORMAL LOW (ref 34.0–46.6)
Hemoglobin: 11.2 g/dL (ref 11.1–15.9)
MCH: 31.3 pg (ref 26.6–33.0)
MCHC: 33.8 g/dL (ref 31.5–35.7)
MCV: 93 fL (ref 79–97)
Platelets: 167 10*3/uL (ref 150–450)
RBC: 3.58 x10E6/uL — ABNORMAL LOW (ref 3.77–5.28)
RDW: 12.3 % (ref 11.7–15.4)
WBC: 10.8 10*3/uL (ref 3.4–10.8)

## 2019-06-01 LAB — GLUCOSE, 1 HOUR GESTATIONAL: Gestational Diabetes Screen: 154 mg/dL — ABNORMAL HIGH (ref 65–139)

## 2019-06-01 LAB — RPR: RPR Ser Ql: NONREACTIVE

## 2019-06-04 ENCOUNTER — Other Ambulatory Visit (INDEPENDENT_AMBULATORY_CARE_PROVIDER_SITE_OTHER): Payer: Medicaid Other | Admitting: Certified Nurse Midwife

## 2019-06-04 DIAGNOSIS — Z3493 Encounter for supervision of normal pregnancy, unspecified, third trimester: Secondary | ICD-10-CM

## 2019-06-04 DIAGNOSIS — Z131 Encounter for screening for diabetes mellitus: Secondary | ICD-10-CM

## 2019-06-04 DIAGNOSIS — R7309 Other abnormal glucose: Secondary | ICD-10-CM | POA: Insufficient documentation

## 2019-06-05 ENCOUNTER — Telehealth: Payer: Self-pay | Admitting: Certified Nurse Midwife

## 2019-06-05 LAB — CULTURE, OB URINE

## 2019-06-05 LAB — URINE CULTURE, OB REFLEX

## 2019-06-05 NOTE — Telephone Encounter (Signed)
Patient called and stated that she would like to speak with a nurse to go over her glucose screening results. Please advise.

## 2019-06-06 NOTE — Telephone Encounter (Signed)
Spoke with patient- explained the 3 hour glucose test to her. All questions answered.

## 2019-06-11 ENCOUNTER — Other Ambulatory Visit: Payer: Medicaid Other

## 2019-06-13 ENCOUNTER — Ambulatory Visit (INDEPENDENT_AMBULATORY_CARE_PROVIDER_SITE_OTHER): Payer: Medicaid Other | Admitting: Obstetrics and Gynecology

## 2019-06-13 ENCOUNTER — Other Ambulatory Visit: Payer: Medicaid Other

## 2019-06-13 ENCOUNTER — Other Ambulatory Visit: Payer: Self-pay

## 2019-06-13 VITALS — BP 115/75 | HR 100 | Wt 187.3 lb

## 2019-06-13 DIAGNOSIS — Z3493 Encounter for supervision of normal pregnancy, unspecified, third trimester: Secondary | ICD-10-CM

## 2019-06-13 DIAGNOSIS — N898 Other specified noninflammatory disorders of vagina: Secondary | ICD-10-CM

## 2019-06-13 DIAGNOSIS — R7309 Other abnormal glucose: Secondary | ICD-10-CM

## 2019-06-13 DIAGNOSIS — Z131 Encounter for screening for diabetes mellitus: Secondary | ICD-10-CM

## 2019-06-13 DIAGNOSIS — Z23 Encounter for immunization: Secondary | ICD-10-CM | POA: Diagnosis not present

## 2019-06-13 LAB — POCT URINALYSIS DIPSTICK OB
Bilirubin, UA: NEGATIVE
Blood, UA: NEGATIVE
Glucose, UA: NEGATIVE
Ketones, UA: NEGATIVE
Leukocytes, UA: NEGATIVE
Nitrite, UA: NEGATIVE
POC,PROTEIN,UA: NEGATIVE
Spec Grav, UA: 1.015 (ref 1.010–1.025)
Urobilinogen, UA: 0.2 E.U./dL
pH, UA: 7 (ref 5.0–8.0)

## 2019-06-13 NOTE — Progress Notes (Signed)
ROB- 3 hr GTT done today, pt is having some "vaginal tenderness"

## 2019-06-13 NOTE — Progress Notes (Signed)
ROB & 3hGTT- flu vaccine given, ready set baby done also,finishes school in a week and hopes stress is lessened then. Some anxiety. Feels slightly irritated Microscopic wet-mount exam shows negative for pathogens, normal epithelial cells.left labia slightly red without lesions.reassured.

## 2019-06-14 ENCOUNTER — Encounter: Payer: Medicaid Other | Admitting: Certified Nurse Midwife

## 2019-06-14 ENCOUNTER — Other Ambulatory Visit: Payer: Self-pay

## 2019-06-14 ENCOUNTER — Other Ambulatory Visit (INDEPENDENT_AMBULATORY_CARE_PROVIDER_SITE_OTHER): Payer: Medicaid Other | Admitting: Certified Nurse Midwife

## 2019-06-14 ENCOUNTER — Encounter: Payer: Self-pay | Admitting: Certified Nurse Midwife

## 2019-06-14 DIAGNOSIS — O24419 Gestational diabetes mellitus in pregnancy, unspecified control: Secondary | ICD-10-CM | POA: Insufficient documentation

## 2019-06-14 LAB — GESTATIONAL GLUCOSE TOLERANCE
Glucose, Fasting: 95 mg/dL — ABNORMAL HIGH (ref 65–94)
Glucose, GTT - 1 Hour: 213 mg/dL — ABNORMAL HIGH (ref 65–179)
Glucose, GTT - 2 Hour: 156 mg/dL — ABNORMAL HIGH (ref 65–154)
Glucose, GTT - 3 Hour: 93 mg/dL (ref 65–139)

## 2019-06-21 ENCOUNTER — Encounter: Payer: Medicaid Other | Attending: Certified Nurse Midwife | Admitting: *Deleted

## 2019-06-21 ENCOUNTER — Encounter: Payer: Self-pay | Admitting: *Deleted

## 2019-06-21 ENCOUNTER — Other Ambulatory Visit: Payer: Self-pay

## 2019-06-21 VITALS — BP 110/66 | Ht 66.0 in | Wt 187.4 lb

## 2019-06-21 DIAGNOSIS — Z713 Dietary counseling and surveillance: Secondary | ICD-10-CM | POA: Insufficient documentation

## 2019-06-21 DIAGNOSIS — O24419 Gestational diabetes mellitus in pregnancy, unspecified control: Secondary | ICD-10-CM | POA: Diagnosis not present

## 2019-06-21 DIAGNOSIS — O24410 Gestational diabetes mellitus in pregnancy, diet controlled: Secondary | ICD-10-CM

## 2019-06-21 NOTE — Progress Notes (Signed)
Diabetes Self-Management Education  Visit Type: First/Initial  Appt. Start Time: 1040 Appt. End Time: [id number]  06/21/2019  Ms. 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 female with a diagnosis of Diabetes: Gestational Diabetes.   ASSESSMENT  Blood pressure 110/66, height 5\' 6"  (1.676 m), weight 187 lb 6.4 oz (85 kg), last menstrual period 11/15/2018, estimated date of delivery 08/24/2019 Body mass index is 30.25 kg/m.  Diabetes Self-Management Education - 06/21/19 1331      Visit Information   Visit Type  First/Initial      Initial Visit   Diabetes Type  Gestational Diabetes    Are you currently following a meal plan?  Yes    What type of meal plan do you follow?  "stopped eating a lot of fast foods and watching my sugar"    Are you taking your medications as prescribed?  Yes    Date Diagnosed  few weeks ago      Health Coping   How would you rate your overall health?  Good      Psychosocial Assessment   Patient Belief/Attitude about Diabetes  Other (comment)   "kind of stressful"   Self-care barriers  None    Self-management support  Doctor's office;Family    Patient Concerns  Nutrition/Meal planning;Monitoring;Healthy Lifestyle;Glycemic Control    Special Needs  None    Preferred Learning Style  Auditory;Hands on    Learning Readiness  Change in progress    How often do you need to have someone help you when you read instructions, pamphlets, or other written materials from your doctor or pharmacy?  1 - Never    What is the last grade level you completed in school?  12th      Pre-Education Assessment   Patient understands the diabetes disease and treatment process.  Needs Instruction    Patient understands incorporating nutritional management into lifestyle.  Needs Instruction    Patient undertands incorporating physical activity into lifestyle.  Needs Instruction    Patient understands using medications safely.  Needs Instruction    Patient  understands monitoring blood glucose, interpreting and using results  Needs Instruction    Patient understands prevention, detection, and treatment of acute complications.  Needs Instruction    Patient understands prevention, detection, and treatment of chronic complications.  Needs Instruction    Patient understands how to develop strategies to address psychosocial issues.  Needs Instruction    Patient understands how to develop strategies to promote health/change behavior.  Needs Instruction      Complications   How often do you check your blood sugar?  0 times/day (not testing)   Provided Accu-Chek Guide Me meter and instructed on use. BG upon return demonstration was 92 mg/dL at 12:10 pm - 30 minutes after eating peanut butter crackers.   Have you had a dilated eye exam in the past 12 months?  No    Have you had a dental exam in the past 12 months?  Yes    Are you checking your feet?  No      Dietary Intake   Breakfast  sugary cereal and milk; waffles    Snack (morning)  cheese and crackers; banan; yogurt, chips, chocolate    Lunch  frozen pizza; chicken and rice; mustard sandwich with chips    Snack (afternoon)  same as morning snack    Dinner  beef and chicken; occasional fried salmon with potatoes, green beans, corn, rice pasta - doesn't like  variety of vegetables    Beverage(s)  water, juice, sweet tea, Gatorade      Exercise   Exercise Type  ADL's      Patient Education   Previous Diabetes Education  No    Disease state   Definition of diabetes, type 1 and 2, and the diagnosis of diabetes;Factors that contribute to the development of diabetes    Nutrition management   Role of diet in the treatment of diabetes and the relationship between the three main macronutrients and blood glucose level    Physical activity and exercise   Role of exercise on diabetes management, blood pressure control and cardiac health.    Medications  Other (comment)   Limited use of oral medications  during pregnancy and possibility of insulin.   Monitoring  Taught/evaluated SMBG meter.;Purpose and frequency of SMBG.;Taught/discussed recording of test results and interpretation of SMBG.;Ketone testing, when, how.    Chronic complications  Relationship between chronic complications and blood glucose control    Psychosocial adjustment  Identified and addressed patients feelings and concerns about diabetes    Preconception care  Pregnancy and GDM  Role of pre-pregnancy blood glucose control on the development of the fetus;Reviewed with patient blood glucose goals with pregnancy;Role of family planning for patients with diabetes      Individualized Goals (developed by patient)   Reducing Risk  Improve blood sugars Prevent diabetes complications Lead a healthier lifestyle     Outcomes   Expected Outcomes  Demonstrated interest in learning. Expect positive outcomes    Future DMSE  2 wks       Individualized Plan for Diabetes Self-Management Training:   Learning Objective:  Patient will have a greater understanding of diabetes self-management. Patient education plan is to attend individual and/or group sessions per assessed needs and concerns.   Plan:   Patient Instructions  Read booklet on Gestational Diabetes Follow Gestational Meal Planning Guidelines Avoid cold cereal for breakfast Avoid fruit juice and sugar sweetened drinks (tea, Gatorade) Limit desserts/sweets and fried foods Complete a 3 Day Food Record and bring to next appointment Check blood sugars 4 x day - before breakfast and 2 hrs after every meal and record  Bring blood sugar log to all appointments Call MD for prescription for meter strips and lancets Strips Accu-Chek Guide Lancets  Accu-Chek FastClix Purchase urine ketone strips if ordered by MD and check urine ketones every am:  If + increase bedtime snack to 1 protein and 2 carbohydrate servings Walk 20-30 minutes at least 5 x week if permitted by MD  Expected  Outcomes:  Demonstrated interest in learning. Expect positive outcomes  Education material provided:  Gestational Booklet Gestational Meal Planning Guidelines Simple Meal Plan Viewed Gestational Diabetes Video Meter = Accu-Chek Guide Me 3 Day Food Record Goals for a Healthy Pregnancy  If problems or questions, patient to contact team via:   Johny Drilling, [place], [place], CDE [phone]  Future DSME appointment: 2 wks  July 03, 2019 with the dietitian

## 2019-06-21 NOTE — Patient Instructions (Signed)
Read booklet on Gestational Diabetes Follow Gestational Meal Planning Guidelines Avoid cold cereal for breakfast Avoid fruit juice and sugar sweetened drinks (tea, Gatorade) Limit desserts/sweets and fried foods Complete a 3 Day Food Record and bring to next appointment Check blood sugars 4 x day - before breakfast and 2 hrs after every meal and record  Bring blood sugar log to all appointments Call MD for prescription for meter strips and lancets Strips Accu-Chek Guide Lancets  Accu-Chek FastClix Purchase urine ketone strips if ordered by MD and check urine ketones every am:  If + increase bedtime snack to 1 protein and 2 carbohydrate servings Walk 20-30 minutes at least 5 x week if permitted by MD

## 2019-06-25 ENCOUNTER — Encounter: Payer: Self-pay | Admitting: Certified Nurse Midwife

## 2019-06-25 ENCOUNTER — Other Ambulatory Visit: Payer: Self-pay

## 2019-06-25 MED ORDER — GLUCOSE BLOOD VI STRP: ORAL_STRIP | 12 refills | Status: DC

## 2019-06-25 MED ORDER — ACCU-CHEK FASTCLIX LANCETS MISC: 1.0000 | Freq: Four times a day (QID) | 12 refills | Status: DC

## 2019-06-27 ENCOUNTER — Other Ambulatory Visit: Payer: Self-pay

## 2019-06-27 ENCOUNTER — Ambulatory Visit (INDEPENDENT_AMBULATORY_CARE_PROVIDER_SITE_OTHER): Payer: Medicaid Other | Admitting: Certified Nurse Midwife

## 2019-06-27 VITALS — BP 125/76 | HR 108 | Wt 188.5 lb

## 2019-06-27 DIAGNOSIS — M79671 Pain in right foot: Secondary | ICD-10-CM

## 2019-06-27 DIAGNOSIS — Z3493 Encounter for supervision of normal pregnancy, unspecified, third trimester: Secondary | ICD-10-CM

## 2019-06-27 DIAGNOSIS — Z3A31 31 weeks gestation of pregnancy: Secondary | ICD-10-CM

## 2019-06-27 DIAGNOSIS — O24419 Gestational diabetes mellitus in pregnancy, unspecified control: Secondary | ICD-10-CM

## 2019-06-27 DIAGNOSIS — M79672 Pain in left foot: Secondary | ICD-10-CM

## 2019-06-27 LAB — POCT URINALYSIS DIPSTICK OB
Bilirubin, UA: NEGATIVE
Blood, UA: NEGATIVE
Glucose, UA: NEGATIVE
Leukocytes, UA: NEGATIVE
Nitrite, UA: NEGATIVE
Spec Grav, UA: 1.015 (ref 1.010–1.025)
Urobilinogen, UA: 0.2 E.U./dL
pH, UA: 7 (ref 5.0–8.0)

## 2019-06-27 NOTE — Progress Notes (Signed)
ROB-Patient c/o feet pain while working x3 weeks, works 6.5 hour shifts.

## 2019-06-27 NOTE — Patient Instructions (Signed)

## 2019-06-27 NOTE — Progress Notes (Signed)
ROB-Reports bilateral foot pain while working for the last three (3) weeks. Dicussed home treatment measures. Advised may decreased worked hours if desired. Initial visit to [facility] for GDM education on 10/02, next visit 10/14. Blood sugar log reviewed today; all findings wnl.  Anticipatory guidance regarding course of prenatal care. Reviewed red flag symptoms and when to call. RTC x 2 weeks for ROB or sooner if needed

## 2019-07-03 ENCOUNTER — Other Ambulatory Visit: Payer: Self-pay

## 2019-07-03 ENCOUNTER — Encounter: Payer: Medicaid Other | Admitting: Dietician

## 2019-07-03 ENCOUNTER — Encounter: Payer: Self-pay | Admitting: Dietician

## 2019-07-03 VITALS — BP 122/60 | Ht 66.0 in | Wt 190.3 lb

## 2019-07-03 DIAGNOSIS — O24410 Gestational diabetes mellitus in pregnancy, diet controlled: Secondary | ICD-10-CM

## 2019-07-03 DIAGNOSIS — O24419 Gestational diabetes mellitus in pregnancy, unspecified control: Secondary | ICD-10-CM | POA: Diagnosis not present

## 2019-07-03 DIAGNOSIS — Z713 Dietary counseling and surveillance: Secondary | ICD-10-CM | POA: Diagnosis not present

## 2019-07-03 NOTE — Patient Instructions (Signed)
   Keep up current eating pattern, good job!  Can increase portions of protein foods or low-carb veggies to help with fulness after eating.   If you feel shaky/ weak, like blood sugar is dropping too low, test it and if under 70, drink 4oz juice or regular soda, or 8oz milk and allow 10-15 minutes for blood sugar to improve, then follow with a meal or snack that contains protein.

## 2019-07-03 NOTE — Progress Notes (Signed)
.   Patient's BG record indicates fasting BGs ranging 84-94, and post-meal BGs ranging 73-105mg /dl. . Patient's food diary indicates she is eating at regular intervals, and controlling carb intake with most meals, including protein sources regularly. She reports eating sugar-sweetened cereal for breakfast 2 times recently and BG was still in goal range afterwards. She states she was eating 2 bowls of cereal prior to diabetes diagnosis.   . Provided  (approx.) 2000kcal meal plan, and wrote individualized menus based on patient's food preferences. . Instructed patient on food safety, including avoidance of Listeriosis, and limiting mercury from fish. . Discussed importance of maintaining healthy lifestyle habits to reduce risk of Type 2 DM as well as Gestational DM with any future pregnancies. . Advised patient to use any remaining testing supplies to test some BGs after delivery, and to have BG tested ideally annually, as well as prior to attempting future pregnancies.

## 2019-07-09 ENCOUNTER — Other Ambulatory Visit: Payer: Self-pay

## 2019-07-09 ENCOUNTER — Encounter: Payer: Self-pay | Admitting: Certified Nurse Midwife

## 2019-07-09 ENCOUNTER — Ambulatory Visit (INDEPENDENT_AMBULATORY_CARE_PROVIDER_SITE_OTHER): Payer: Medicaid Other | Admitting: Certified Nurse Midwife

## 2019-07-09 VITALS — BP 115/71 | HR 106 | Wt 191.2 lb

## 2019-07-09 DIAGNOSIS — Z3493 Encounter for supervision of normal pregnancy, unspecified, third trimester: Secondary | ICD-10-CM

## 2019-07-09 DIAGNOSIS — Z3A33 33 weeks gestation of pregnancy: Secondary | ICD-10-CM

## 2019-07-09 LAB — POCT URINALYSIS DIPSTICK OB
Bilirubin, UA: NEGATIVE
Blood, UA: NEGATIVE
Glucose, UA: NEGATIVE
Ketones, UA: NEGATIVE
Leukocytes, UA: NEGATIVE
Nitrite, UA: NEGATIVE
POC,PROTEIN,UA: NEGATIVE
Spec Grav, UA: 1.01 (ref 1.010–1.025)
Urobilinogen, UA: 0.2 E.U./dL
pH, UA: 6 (ref 5.0–8.0)

## 2019-07-09 NOTE — Progress Notes (Signed)
ROB doing well. Feels good movement. Discussed BS, she forgot to bring in log. States fasting in 80's and 2 hr pp all normal except x 1 9128-after eating pizza). Discussed importance of bringing log in and tracking sugars. She verbalizes and agrees to plan. Discussed u/s @36  wks for growth and NST's. She verbalizes agreement. Follow up 2 wks.   Philip Aspen, CNM

## 2019-07-09 NOTE — Patient Instructions (Signed)

## 2019-07-26 ENCOUNTER — Encounter: Payer: Medicaid Other | Admitting: Obstetrics and Gynecology

## 2019-07-30 ENCOUNTER — Other Ambulatory Visit: Payer: Self-pay

## 2019-07-30 ENCOUNTER — Ambulatory Visit (INDEPENDENT_AMBULATORY_CARE_PROVIDER_SITE_OTHER): Payer: Medicaid Other

## 2019-07-30 ENCOUNTER — Encounter: Payer: Medicaid Other | Admitting: Obstetrics and Gynecology

## 2019-07-30 ENCOUNTER — Ambulatory Visit (INDEPENDENT_AMBULATORY_CARE_PROVIDER_SITE_OTHER): Payer: Medicaid Other | Admitting: Certified Nurse Midwife

## 2019-07-30 ENCOUNTER — Other Ambulatory Visit: Payer: Self-pay | Admitting: Obstetrics and Gynecology

## 2019-07-30 VITALS — BP 122/78 | HR 108 | Wt 198.0 lb

## 2019-07-30 DIAGNOSIS — Z3493 Encounter for supervision of normal pregnancy, unspecified, third trimester: Secondary | ICD-10-CM | POA: Diagnosis not present

## 2019-07-30 DIAGNOSIS — Z3492 Encounter for supervision of normal pregnancy, unspecified, second trimester: Secondary | ICD-10-CM

## 2019-07-30 DIAGNOSIS — Z3A36 36 weeks gestation of pregnancy: Secondary | ICD-10-CM

## 2019-07-30 LAB — POCT URINALYSIS DIPSTICK OB
Bilirubin, UA: NEGATIVE
Blood, UA: NEGATIVE
Glucose, UA: NEGATIVE
Ketones, UA: NEGATIVE
Leukocytes, UA: NEGATIVE
Nitrite, UA: NEGATIVE
POC,PROTEIN,UA: NEGATIVE
Spec Grav, UA: 1.025 (ref 1.010–1.025)
Urobilinogen, UA: 0.2 E.U./dL
pH, UA: 5 (ref 5.0–8.0)

## 2019-07-30 NOTE — Patient Instructions (Signed)
Group B Streptococcus Infection During Pregnancy  Group B Streptococcus (GBS) is a type of bacteria (Streptococcus agalactiae) that is often found in healthy people, commonly in the rectum, vagina, and intestines. In people who are healthy and not pregnant, the bacteria rarely cause serious illness or complications. However, women who test positive for GBS during pregnancy can pass the bacteria to their baby during childbirth, which can cause serious infection in the baby after birth. Women with GBS may also have infections during their pregnancy or immediately after childbirth, such as urinary tract infections (UTIs) or infections of the uterus (uterine infections). Having GBS also increases a woman's risk of complications during pregnancy, such as early (preterm) labor or delivery, miscarriage, or stillbirth. Routine testing (screening) for GBS is recommended for all pregnant women. What increases the risk? You may have a higher risk for GBS infection during pregnancy if you had one during a past pregnancy. What are the signs or symptoms? In most cases, GBS infection does not cause symptoms in pregnant women. Signs and symptoms of a possible GBS-related infection may include:  Labor starting before the 37th week of pregnancy.  A UTI or bladder infection, which may cause: ? Fever. ? Pain or burning during urination. ? Frequent urination.  Fever during labor, along with: ? Bad-smelling discharge. ? Uterine tenderness. ? Rapid heartbeat in the mother, baby, or both. Rare but serious symptoms of a possible GBS-related infection in women include:  Blood infection (septicemia). This may cause fever, chills, or confusion.  Lung infection (pneumonia). This may cause fever, chills, cough, rapid breathing, difficulty breathing, or chest pain.  Bone, joint, skin, or soft tissue infection. How is this diagnosed? You may be screened for GBS between week 35 and week 37 of your pregnancy. If you have  symptoms of preterm labor, you may be screened earlier. This condition is diagnosed based on lab test results from:  A swab of fluid from the vagina and rectum.  A urine sample. How is this treated? This condition is treated with antibiotic medicine. When you go into labor, or as soon as your water breaks (your membranes rupture), you will be given antibiotics through an IV tube. Antibiotics will continue until after you give birth. If you are having a cesarean delivery, you do not need antibiotics unless your membranes have already ruptured. Follow these instructions at home:  Take over-the-counter and prescription medicines only as told by your health care provider.  Take your antibiotic medicine as told by your health care provider. Do not stop taking the antibiotic even if you start to feel better.  Keep all pre-birth (prenatal) visits and follow-up visits as told by your health care provider. This is important. Contact a health care provider if:  You have pain or burning when you urinate.  You have to urinate frequently.  You have a fever or chills.  You develop a bad-smelling vaginal discharge. Get help right away if:  Your membranes rupture.  You go into labor.  You have severe pain in your abdomen.  You have difficulty breathing.  You have chest pain. This information is not intended to replace advice given to you by your health care provider. Make sure you discuss any questions you have with your health care provider. Document Released: 12/13/2007 Document Revised: 12/27/2018 Document Reviewed: 03/31/2016 Elsevier Patient Education  2020 [employer].

## 2019-07-30 NOTE — Progress Notes (Signed)
ROB and u/s today for growth/AFI due to GDM-diet controlled (see below). Results reviewed. 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  GBS and cultures today. BS reviewed . Pt did not bring her log. States fasting's in 80's and 2 hr pp 90-120. Encouraged her to bring log . She agrees. Follow up 1 wk.   Philip Aspen, CNM    Patient Name: Cynthia Harrell DOB: [birth year] MRN: [id number] ULTRASOUND REPORT  Location: [facility] Date of Service: 07/30/2019   Indications:growth/afi Findings:  [name] intrauterine pregnancy is visualized with FHR at 21 BPM. Biometrics give an (U/S) Gestational age of [redacted]w[redacted]d and an (U/S) EDD of 08/18/2019; this correlates with the clinically established Estimated Date of Delivery: 08/24/19.  Fetal presentation is Cephalic.  Placenta: anterior. Grade: 2 AFI: 9.7 cm  Growth percentile is 64. EFW: 3202 g (7 lbs 1 oz)  Impression: 1. [redacted]w[redacted]d Viable Singleton Intrauterine pregnancy previously established criteria. 2. Growth is 64 %ile.  AFI is 9.7 cm.   Recommendations: 1.Clinical correlation with the patient's History and Physical Exam.   Jenine  M. Albertine Grates     RDMS

## 2019-08-01 LAB — STREP GP B NAA: Strep Gp B NAA: NEGATIVE

## 2019-08-02 LAB — GC/CHLAMYDIA PROBE AMP
Chlamydia trachomatis, NAA: NEGATIVE
Neisseria Gonorrhoeae by PCR: NEGATIVE

## 2019-08-05 ENCOUNTER — Other Ambulatory Visit: Payer: Self-pay

## 2019-08-05 ENCOUNTER — Observation Stay
Admission: EM | Admit: 2019-08-05 | Discharge: 2019-08-05 | Disposition: A | Discharge status: Home / Self Care | Payer: Medicaid Other | Attending: Certified Nurse Midwife | Admitting: Certified Nurse Midwife

## 2019-08-05 DIAGNOSIS — O350XX0 Maternal care for (suspected) central nervous system malformation in fetus, not applicable or unspecified: Secondary | ICD-10-CM

## 2019-08-05 DIAGNOSIS — O3503X0 Maternal care for (suspected) central nervous system malformation or damage in fetus, choroid plexus cysts, not applicable or unspecified: Secondary | ICD-10-CM

## 2019-08-05 DIAGNOSIS — R109 Unspecified abdominal pain: Secondary | ICD-10-CM | POA: Diagnosis not present

## 2019-08-05 DIAGNOSIS — Z3A37 37 weeks gestation of pregnancy: Secondary | ICD-10-CM | POA: Diagnosis not present

## 2019-08-05 DIAGNOSIS — M549 Dorsalgia, unspecified: Secondary | ICD-10-CM | POA: Insufficient documentation

## 2019-08-05 DIAGNOSIS — O26893 Other specified pregnancy related conditions, third trimester: Secondary | ICD-10-CM | POA: Diagnosis not present

## 2019-08-05 DIAGNOSIS — O24410 Gestational diabetes mellitus in pregnancy, diet controlled: Secondary | ICD-10-CM

## 2019-08-05 LAB — URINALYSIS, ROUTINE W REFLEX MICROSCOPIC
Bilirubin Urine: NEGATIVE
Glucose, UA: NEGATIVE mg/dL
Hgb urine dipstick: NEGATIVE
Ketones, ur: NEGATIVE mg/dL
Leukocytes,Ua: NEGATIVE
Nitrite: NEGATIVE
Protein, ur: NEGATIVE mg/dL
Specific Gravity, Urine: 1.006 (ref 1.005–1.030)
pH: 7 (ref 5.0–8.0)

## 2019-08-05 MED ORDER — ZOLPIDEM TARTRATE ER 6.25 MG PO TBCR: 6.2500 mg | EXTENDED_RELEASE_TABLET | Freq: Every evening | ORAL | 0 refills | Status: DC | PRN

## 2019-08-05 NOTE — OB Triage Note (Signed)
Pt. Presented to L/D with reported abdominal pain since 2100 on 08/04/19. It is a sharp, intermittent pain, and she rates it 6/10. It does occasionally move to her stomach. Upon arrival, she does not feel any of the pains. No bleeding or LOF. Positive fetal movement. No urinary symptoms. VSS. Will continue to monitor.

## 2019-08-05 NOTE — OB Triage Note (Signed)
    L&D OB Triage Note  SUBJECTIVE Cynthia Harrell is a 33 y.o. [phone] female at [redacted]w[redacted]d, EDD Estimated Date of Delivery: 08/24/19 who presented to triage with complaints of abdominal and back pain since last night that is improving now. She denies loss of fluid, vaginal bleeding and is feeling good movement.   OB History  Gravida Para Term Preterm AB Living  4 2 2  0 1 2  SAB TAB Ectopic Multiple Live Births  0 1 0 0 2    # Outcome Date GA Lbr Len/2nd Weight Sex Delivery Anes PTL Lv  4 Current           3 Term 11/06/17 [redacted]w[redacted]d / 00:43 4220 g F Vag-Spont EPI  LIV     Name: Ramseur,GIRL Mieshia     Apgar1: 7  Apgar5: 9  2 Term 12/04/06 [redacted]w[redacted]d  3657 g F Vag-Spont EPI N LIV  1 TAB      TAB       No medications prior to admission.     OBJECTIVE  Nursing Evaluation:   BP 138/75 (BP Location: Left Arm)   Pulse (!) 106   Temp 98.4 F (36.9 C) (Oral)   Resp 16   Ht 5\' 6"  (1.676 m)   Wt 89.8 kg   LMP 11/15/2018   BMI 31.96 kg/m    Findings:  Back pain in pregnancy, irregular mild contractions  NST was performed and has been reviewed by me.  NST INTERPRETATION: Category I  Mode: External Baseline Rate (A): 125 bpm(fht) Variability: Moderate Accelerations: 15 x 15 Decelerations: None     Contraction Frequency (min): occ with UI  ASSESSMENT Impression:  1.  Pregnancy:  [id number] at [redacted]w[redacted]d , EDD Estimated Date of Delivery: 08/24/19 2.  NST:  Category I  3. No change cervical change since exam done in office on 07/30/19  PLAN 1. Reassurance given 2. Discharge home with standard labor precautions given to return to L&D or call the office for problems. 3. Continue routine prenatal care.  Philip Aspen, CNM

## 2019-08-06 ENCOUNTER — Other Ambulatory Visit: Payer: Medicaid Other

## 2019-08-06 ENCOUNTER — Ambulatory Visit (INDEPENDENT_AMBULATORY_CARE_PROVIDER_SITE_OTHER): Payer: Medicaid Other | Admitting: Certified Nurse Midwife

## 2019-08-06 VITALS — Wt 197.1 lb

## 2019-08-06 DIAGNOSIS — Z3A37 37 weeks gestation of pregnancy: Secondary | ICD-10-CM | POA: Diagnosis not present

## 2019-08-06 DIAGNOSIS — R102 Pelvic and perineal pain: Secondary | ICD-10-CM | POA: Diagnosis not present

## 2019-08-06 DIAGNOSIS — M549 Dorsalgia, unspecified: Secondary | ICD-10-CM

## 2019-08-06 DIAGNOSIS — O26893 Other specified pregnancy related conditions, third trimester: Secondary | ICD-10-CM | POA: Diagnosis not present

## 2019-08-06 DIAGNOSIS — Z3403 Encounter for supervision of normal first pregnancy, third trimester: Secondary | ICD-10-CM

## 2019-08-06 LAB — POCT URINALYSIS DIPSTICK OB
Bilirubin, UA: NEGATIVE
Blood, UA: NEGATIVE
Glucose, UA: NEGATIVE
Ketones, UA: NEGATIVE
Leukocytes, UA: NEGATIVE
Nitrite, UA: NEGATIVE
POC,PROTEIN,UA: NEGATIVE
Spec Grav, UA: 1.01 (ref 1.010–1.025)
Urobilinogen, UA: 0.2 E.U./dL
pH, UA: 6.5 (ref 5.0–8.0)

## 2019-08-06 NOTE — Patient Instructions (Signed)
Braxton Hicks Contractions Contractions of the uterus can occur throughout pregnancy, but they are not always a sign that you are in labor. You may have practice contractions called Braxton Hicks contractions. These false labor contractions are sometimes confused with true labor. What are Braxton Hicks contractions? Braxton Hicks contractions are tightening movements that occur in the muscles of the uterus before labor. Unlike true labor contractions, these contractions do not result in opening (dilation) and thinning of the cervix. Toward the end of pregnancy (32-34 weeks), Braxton Hicks contractions can happen more often and may become stronger. These contractions are sometimes difficult to tell apart from true labor because they can be very uncomfortable. You should not feel embarrassed if you go to the hospital with false labor. Sometimes, the only way to tell if you are in true labor is for your health care provider to look for changes in the cervix. The health care provider will do a physical exam and may monitor your contractions. If you are not in true labor, the exam should show that your cervix is not dilating and your water has not broken. If there are no other health problems associated with your pregnancy, it is completely safe for you to be sent home with false labor. You may continue to have Braxton Hicks contractions until you go into true labor. How to tell the difference between true labor and false labor True labor  Contractions last 30-70 seconds.  Contractions become very regular.  Discomfort is usually felt in the top of the uterus, and it spreads to the lower abdomen and low back.  Contractions do not go away with walking.  Contractions usually become more intense and increase in frequency.  The cervix dilates and gets thinner. False labor  Contractions are usually shorter and not as strong as true labor contractions.  Contractions are usually irregular.  Contractions  are often felt in the front of the lower abdomen and in the groin.  Contractions may go away when you walk around or change positions while lying down.  Contractions get weaker and are shorter-lasting as time goes on.  The cervix usually does not dilate or become thin. Follow these instructions at home:   Take over-the-counter and prescription medicines only as told by your health care provider.  Keep up with your usual exercises and follow other instructions from your health care provider.  Eat and drink lightly if you think you are going into labor.  If Braxton Hicks contractions are making you uncomfortable: ? Change your position from lying down or resting to walking, or change from walking to resting. ? Sit and rest in a tub of warm water. ? Drink enough fluid to keep your urine pale yellow. Dehydration may cause these contractions. ? Do slow and deep breathing several times an hour.  Keep all follow-up prenatal visits as told by your health care provider. This is important. Contact a health care provider if:  You have a fever.  You have continuous pain in your abdomen. Get help right away if:  Your contractions become stronger, more regular, and closer together.  You have fluid leaking or gushing from your vagina.  You pass blood-tinged mucus (bloody show).  You have bleeding from your vagina.  You have low back pain that you never had before.  You feel your baby's head pushing down and causing pelvic pressure.  Your baby is not moving inside you as much as it used to. Summary  Contractions that occur before labor are   called Braxton Hicks contractions, false labor, or practice contractions.  Braxton Hicks contractions are usually shorter, weaker, farther apart, and less regular than true labor contractions. True labor contractions usually become progressively stronger and regular, and they become more frequent.  Manage discomfort from Braxton Hicks contractions  by changing position, resting in a warm bath, drinking plenty of water, or practicing deep breathing. This information is not intended to replace advice given to you by your health care provider. Make sure you discuss any questions you have with your health care provider. Document Released: 01/19/2017 Document Revised: 08/18/2017 Document Reviewed: 01/19/2017 Elsevier Patient Education  2020 [employer]  

## 2019-08-06 NOTE — Progress Notes (Signed)
ROB and NST today. Doing well. Feels good movement. Has back and pelvic pain. Encouraged use of belly band.  BS log reviewed (copy scanned into chart). BS fasings 77-94. One elevated at 112 (drank milk prior), 2 hr pp 60-123.   NST reactive, catergory 1  Baseline:  140  Moderate variability accelerations present, deceleratioss absent irritability present.   Discussed induction 39-40 wks. Will schedule next visit. Follow up 1 wk.   Philip Aspen, CNM

## 2019-08-06 NOTE — Progress Notes (Signed)
NONSTRESS TEST INTERPRETATION  INDICATIONS:   FHR baseline:140 RESULTS:COMMENTS:reactive NST   PLAN: 1. Continue fetal kick counts twice a day. 2. Continue antepartum testing as scheduled-Biweekly   Fermin Schwab, LPN

## 2019-08-09 ENCOUNTER — Other Ambulatory Visit: Payer: Self-pay

## 2019-08-09 ENCOUNTER — Inpatient Hospital Stay
Admission: EM | Admit: 2019-08-09 | Discharge: 2019-08-11 | DRG: 806 | Disposition: A | Discharge status: Home / Self Care | Payer: Medicaid Other | Attending: Certified Nurse Midwife | Admitting: Certified Nurse Midwife

## 2019-08-09 DIAGNOSIS — O9832 Other infections with a predominantly sexual mode of transmission complicating childbirth: Secondary | ICD-10-CM | POA: Diagnosis present

## 2019-08-09 DIAGNOSIS — O3503X0 Maternal care for (suspected) central nervous system malformation or damage in fetus, choroid plexus cysts, not applicable or unspecified: Secondary | ICD-10-CM

## 2019-08-09 DIAGNOSIS — O350XX0 Maternal care for (suspected) central nervous system malformation in fetus, not applicable or unspecified: Secondary | ICD-10-CM

## 2019-08-09 DIAGNOSIS — Z3A37 37 weeks gestation of pregnancy: Secondary | ICD-10-CM

## 2019-08-09 DIAGNOSIS — Z20828 Contact with and (suspected) exposure to other viral communicable diseases: Secondary | ICD-10-CM | POA: Diagnosis present

## 2019-08-09 DIAGNOSIS — A6000 Herpesviral infection of urogenital system, unspecified: Secondary | ICD-10-CM | POA: Diagnosis present

## 2019-08-09 DIAGNOSIS — O24420 Gestational diabetes mellitus in childbirth, diet controlled: Principal | ICD-10-CM | POA: Diagnosis present

## 2019-08-09 DIAGNOSIS — O26893 Other specified pregnancy related conditions, third trimester: Secondary | ICD-10-CM | POA: Diagnosis present

## 2019-08-09 DIAGNOSIS — O4202 Full-term premature rupture of membranes, onset of labor within 24 hours of rupture: Secondary | ICD-10-CM

## 2019-08-09 DIAGNOSIS — O24410 Gestational diabetes mellitus in pregnancy, diet controlled: Secondary | ICD-10-CM

## 2019-08-09 LAB — CBC
HCT: 34 % — ABNORMAL LOW (ref 36.0–46.0)
Hemoglobin: 11.6 g/dL — ABNORMAL LOW (ref 12.0–15.0)
MCH: 30.4 pg (ref 26.0–34.0)
MCHC: 34.1 g/dL (ref 30.0–36.0)
MCV: 89 fL (ref 80.0–100.0)
Platelets: 176 10*3/uL (ref 150–400)
RBC: 3.82 MIL/uL — ABNORMAL LOW (ref 3.87–5.11)
RDW: 14 % (ref 11.5–15.5)
WBC: 11.6 10*3/uL — ABNORMAL HIGH (ref 4.0–10.5)
nRBC: 0 % (ref 0.0–0.2)

## 2019-08-09 LAB — TYPE AND SCREEN
ABO/RH(D): A POS
Antibody Screen: NEGATIVE

## 2019-08-09 LAB — GLUCOSE, RANDOM
Glucose, Bld: 80 mg/dL (ref 70–99)
Glucose, Bld: 237 mg/dL — ABNORMAL HIGH (ref 70–99)

## 2019-08-09 LAB — SARS CORONAVIRUS 2 BY RT PCR (HOSPITAL ORDER, PERFORMED IN ~~LOC~~ HOSPITAL LAB): SARS Coronavirus 2: NEGATIVE

## 2019-08-09 MED ORDER — ONDANSETRON HCL 4 MG PO TABS: 4.0000 mg | ORAL_TABLET | ORAL | Status: DC | PRN

## 2019-08-09 MED ORDER — OXYCODONE-ACETAMINOPHEN 5-325 MG PO TABS: 2.0000 | ORAL_TABLET | ORAL | Status: DC | PRN

## 2019-08-09 MED ORDER — FERROUS SULFATE 325 (65 FE) MG PO TABS
325.0000 mg | ORAL_TABLET | Freq: Every day | ORAL | Status: DC
  Administered 2019-08-10: 325 mg via ORAL
  Filled 2019-08-09 (×2): qty 1

## 2019-08-09 MED ORDER — OXYTOCIN 40 UNITS IN NORMAL SALINE INFUSION - SIMPLE MED
2.5000 [IU]/h | INTRAVENOUS | Status: DC
  Administered 2019-08-09: 2.5 [IU]/h via INTRAVENOUS
  Filled 2019-08-09: qty 1000

## 2019-08-09 MED ORDER — ACETAMINOPHEN 325 MG PO TABS: 650.0000 mg | ORAL_TABLET | ORAL | Status: DC | PRN

## 2019-08-09 MED ORDER — BENZOCAINE-MENTHOL 20-0.5 % EX AERO
1.0000 "application " | INHALATION_SPRAY | CUTANEOUS | Status: DC | PRN
  Filled 2019-08-09: qty 56

## 2019-08-09 MED ORDER — ONDANSETRON HCL 4 MG/2ML IJ SOLN: 4.0000 mg | INTRAMUSCULAR | Status: DC | PRN

## 2019-08-09 MED ORDER — DOCUSATE SODIUM 100 MG PO CAPS
100.0000 mg | ORAL_CAPSULE | Freq: Two times a day (BID) | ORAL | Status: DC
  Administered 2019-08-10 (×2): 100 mg via ORAL
  Filled 2019-08-09 (×3): qty 1

## 2019-08-09 MED ORDER — AMMONIA AROMATIC IN INHA
RESPIRATORY_TRACT | Status: AC
  Filled 2019-08-09: qty 10

## 2019-08-09 MED ORDER — SENNOSIDES-DOCUSATE SODIUM 8.6-50 MG PO TABS
2.0000 | ORAL_TABLET | ORAL | Status: DC
  Administered 2019-08-10: 2 via ORAL
  Filled 2019-08-09 (×2): qty 2

## 2019-08-09 MED ORDER — MISOPROSTOL 200 MCG PO TABS
ORAL_TABLET | ORAL | Status: AC
  Filled 2019-08-09: qty 4

## 2019-08-09 MED ORDER — OXYTOCIN 10 UNIT/ML IJ SOLN
INTRAMUSCULAR | Status: AC
  Filled 2019-08-09: qty 2

## 2019-08-09 MED ORDER — OXYCODONE-ACETAMINOPHEN 5-325 MG PO TABS
1.0000 | ORAL_TABLET | ORAL | Status: DC | PRN
  Administered 2019-08-10 – 2019-08-11 (×5): 1 via ORAL
  Filled 2019-08-09 (×5): qty 1

## 2019-08-09 MED ORDER — BUTORPHANOL TARTRATE 1 MG/ML IJ SOLN: 1.0000 mg | INTRAMUSCULAR | Status: DC | PRN

## 2019-08-09 MED ORDER — SENNOSIDES-DOCUSATE SODIUM 8.6-50 MG PO TABS: 2.0000 | ORAL_TABLET | ORAL | Status: DC

## 2019-08-09 MED ORDER — COCONUT OIL OIL
1.0000 "application " | TOPICAL_OIL | Status: DC | PRN
  Administered 2019-08-10: 1 via TOPICAL
  Filled 2019-08-09: qty 120

## 2019-08-09 MED ORDER — METHYLERGONOVINE MALEATE 0.2 MG/ML IJ SOLN: 0.2000 mg | INTRAMUSCULAR | Status: DC | PRN

## 2019-08-09 MED ORDER — METHYLERGONOVINE MALEATE 0.2 MG PO TABS: 0.2000 mg | ORAL_TABLET | ORAL | Status: DC | PRN

## 2019-08-09 MED ORDER — LACTATED RINGERS IV SOLN
INTRAVENOUS | Status: DC
  Administered 2019-08-09: 1000 mL via INTRAVENOUS

## 2019-08-09 MED ORDER — LACTATED RINGERS IV SOLN: 500.0000 mL | INTRAVENOUS | Status: DC | PRN

## 2019-08-09 MED ORDER — IBUPROFEN 600 MG PO TABS
600.0000 mg | ORAL_TABLET | Freq: Four times a day (QID) | ORAL | Status: DC
  Administered 2019-08-09: 600 mg via ORAL
  Filled 2019-08-09: qty 1

## 2019-08-09 MED ORDER — LIDOCAINE HCL (PF) 1 % IJ SOLN: 30.0000 mL | INTRAMUSCULAR | Status: DC | PRN

## 2019-08-09 MED ORDER — PRENATAL MULTIVITAMIN CH
1.0000 | ORAL_TABLET | Freq: Every day | ORAL | Status: DC
  Administered 2019-08-10: 1 via ORAL
  Filled 2019-08-09 (×2): qty 1

## 2019-08-09 MED ORDER — OXYTOCIN BOLUS FROM INFUSION
500.0000 mL | Freq: Once | INTRAVENOUS | Status: AC
  Administered 2019-08-09: 1000 mL via INTRAVENOUS

## 2019-08-09 MED ORDER — WITCH HAZEL-GLYCERIN EX PADS: 1.0000 "application " | MEDICATED_PAD | CUTANEOUS | Status: DC | PRN

## 2019-08-09 MED ORDER — SIMETHICONE 80 MG PO CHEW: 80.0000 mg | CHEWABLE_TABLET | ORAL | Status: DC | PRN

## 2019-08-09 MED ORDER — SOD CITRATE-CITRIC ACID 500-334 MG/5ML PO SOLN: 30.0000 mL | ORAL | Status: DC | PRN

## 2019-08-09 MED ORDER — LIDOCAINE HCL (PF) 1 % IJ SOLN
INTRAMUSCULAR | Status: AC
  Filled 2019-08-09: qty 30

## 2019-08-09 MED ORDER — DIBUCAINE (PERIANAL) 1 % EX OINT: 1.0000 "application " | TOPICAL_OINTMENT | CUTANEOUS | Status: DC | PRN

## 2019-08-09 MED ORDER — IBUPROFEN 600 MG PO TABS
600.0000 mg | ORAL_TABLET | Freq: Four times a day (QID) | ORAL | Status: DC
  Administered 2019-08-10 – 2019-08-11 (×6): 600 mg via ORAL
  Filled 2019-08-09 (×7): qty 1

## 2019-08-09 MED ORDER — ONDANSETRON HCL 4 MG/2ML IJ SOLN: 4.0000 mg | Freq: Four times a day (QID) | INTRAMUSCULAR | Status: DC | PRN

## 2019-08-09 NOTE — H&P (Signed)
History and Physical   HPI  Cynthia Harrell is a 33 y.o. [id number] at [redacted]w[redacted]d Estimated Date of Delivery: 08/24/19 who is being admitted for induction of labor and SROM @ 1300, clear fluid.     OB History  OB History  Gravida Para Term Preterm AB Living  4 2 2  0 1 2  SAB TAB Ectopic Multiple Live Births  0 1 0 0 2    # Outcome Date GA Lbr Len/2nd Weight Sex Delivery Anes PTL Lv  4 Current           3 Term 11/06/17 [redacted]w[redacted]d / 00:43 4220 g F Vag-Spont EPI  LIV     Name: Jeanty,GIRL Shikha     Apgar1: 7  Apgar5: 9  2 Term 12/04/06 [redacted]w[redacted]d  3657 g F Vag-Spont EPI N LIV  1 TAB      TAB       PROBLEM LIST  Pregnancy complications or risks: Patient Active Problem List   Diagnosis Date Noted  . Labor and delivery, indication for care 08/09/2019  . Gestational diabetes 06/14/2019  . Elevated glucose level 06/04/2019  . Pelvic pressure in pregnancy 05/31/2019  . Choroid plexus cyst of fetus affecting care of mother, antepartum 04/04/2019  . Hx of abnormal cervical Pap smear 07/08/2015  . Generalized anxiety disorder 06/15/2015  . Herpes simplex without complication     Prenatal labs and studies: ABO, Rh: A/Positive/-- (05/05 [id number]) Antibody: Negative (05/05 0951) Rubella: 5.54 (05/05 0951) RPR: Non Reactive (09/11 1119)  HBsAg: Negative (05/05 0951)  HIV: Non Reactive (05/05 0951)  GBS:--/Negative (11/10 1437)   Past Medical History:  Diagnosis Date  . Gestational diabetes   . 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 High-risk sexual behavior   . Microalbuminuria      Past Surgical History:  Procedure Laterality Date  . THERAPEUTIC ABORTION  2008     Medications    Current Discharge Medication List    CONTINUE these medications which have NOT CHANGED   Details  Accu-Chek FastClix Lancets MISC 1 Device by Percutaneous route 4 (four) times daily. Qty: 100 each, Refills: 12    glucose blood test strip Use 4 (four) times daily. Qty: 100 each, Refills: 12     Prenat w/o A-[id number] (CITRANATAL ASSURE) 35-1 & 300 MG tablet Take 1 tablet by mouth daily. Qty: 30 each, Refills: 6    valACYclovir (VALTREX) 500 MG tablet TAKE 1 TABLET (500 MG TOTAL) BY MOUTH 2 (TWO) TIMES DAILY. Qty: 60 tablet, Refills: 1   Associated Diagnoses: Urinary tract infection with hematuria, site unspecified    zolpidem (AMBIEN CR) 6.25 MG CR tablet Take 1 tablet (6.25 mg total) by mouth at bedtime as needed for sleep. Qty: 10 tablet, Refills: 0         Allergies  Patient has no known allergies.  Review of Systems  Constitutional: negative Eyes: negative Ears, nose, mouth, throat, and face: negative Respiratory: negative Cardiovascular: negative Gastrointestinal: negative Genitourinary:negative Integument/breast: negative Hematologic/lymphatic: negative Musculoskeletal:negative Neurological: negative Behavioral/Psych: negative Endocrine: negative Allergic/Immunologic: negative  Physical Exam  Temp 98.6 F (37 C) (Oral)   Resp (!) 22   Ht 5\' 6"  (1.676 m)   Wt 89.4 kg   LMP 11/15/2018   BMI 31.80 kg/m   Lungs:  CTA B Cardio: RRR  Abd: Soft, gravid, NT Presentation: cephalic EXT: No C/C/ 1+ Edema DTRs: 2+ B CERVIX: Dilation: 8.5 Effacement (%): 100 Station: 0 Exam by:: [employer] RN  See Prenatal records for more detailed PE.     FHR:  Baseline: 125 bpm, Variability: Good {> 6 bpm), Accelerations: Reactive and Decelerations: Absent  Toco: Uterine Contractions: Frequency: Every 1-3 minutes, Duration: 50-80 seconds and Intensity: moderate  Test Results  Results for orders placed or performed during the hospital encounter of 08/09/19 (from the past 24 hour(s))  CBC     Status: Abnormal   Collection Time: 08/09/19  6:16 PM  Result Value Ref Range   WBC 11.6 (H) 4.0 - 10.5 K/uL   RBC 3.82 (L) 3.87 - 5.11 MIL/uL   Hemoglobin 11.6 (L) 12.0 - 15.0 g/dL   HCT 34.0 (L) 36.0 - 46.0 %   MCV 89.0 80.0 - 100.0 fL   MCH 30.4 26.0 -  34.0 pg   MCHC 34.1 30.0 - 36.0 g/dL   RDW 14.0 11.5 - 15.5 %   Platelets 176 150 - 400 K/uL   nRBC 0.0 0.0 - 0.2 %   Group B Strep negative  Hx HSV-on daily valtrex suppression   Assessment   [id number] at [redacted]w[redacted]d Estimated Date of Delivery: 08/24/19  The fetus is reassuring.   Patient Active Problem List   Diagnosis Date Noted  . Labor and delivery, indication for care 08/09/2019  . Gestational diabetes 06/14/2019  . Elevated glucose level 06/04/2019  . Pelvic pressure in pregnancy 05/31/2019  . Choroid plexus cyst of fetus affecting care of mother, antepartum 04/04/2019  . Hx of abnormal cervical Pap smear 07/08/2015  . Generalized anxiety disorder 06/15/2015  . Herpes simplex without complication     Plan  1. Admit to L&D :   continue present management 2. EFM:-- Category 1 3. Epidural if desired.  Stadol for IV pain until epidural requested. 4. Admission labs  5. Anticipate NSVD  Philip Aspen, CNM  08/09/2019 6:30 PM

## 2019-08-10 LAB — CBC
HCT: 30 % — ABNORMAL LOW (ref 36.0–46.0)
Hemoglobin: 10.2 g/dL — ABNORMAL LOW (ref 12.0–15.0)
MCH: 30.5 pg (ref 26.0–34.0)
MCHC: 34 g/dL (ref 30.0–36.0)
MCV: 89.8 fL (ref 80.0–100.0)
Platelets: 156 10*3/uL (ref 150–400)
RBC: 3.34 MIL/uL — ABNORMAL LOW (ref 3.87–5.11)
RDW: 14 % (ref 11.5–15.5)
WBC: 14.6 10*3/uL — ABNORMAL HIGH (ref 4.0–10.5)
nRBC: 0 % (ref 0.0–0.2)

## 2019-08-10 LAB — GLUCOSE, CAPILLARY
Glucose-Capillary: 101 mg/dL — ABNORMAL HIGH (ref 70–99)
Glucose-Capillary: 80 mg/dL (ref 70–99)

## 2019-08-10 LAB — RPR: RPR Ser Ql: NONREACTIVE

## 2019-08-10 NOTE — Discharge Instructions (Signed)

## 2019-08-10 NOTE — Progress Notes (Signed)
Progress Note - Vaginal Delivery  Cynthia Harrell is a 33 y.o. [phone] now PP day 1 s/p Vaginal, Spontaneous .   Subjective:  The patient reports no complaints, up ad lib, voiding, tolerating PO and + flatus   Objective:  Vital signs in last 24 hours: Temp:  [97.9 F (36.6 C)-98.6 F (37 C)] 98.2 F (36.8 C) (11/21 0748) Pulse Rate:  [92-114] 98 (11/21 0748) Resp:  [18-22] 18 (11/21 0748) BP: (100-135)/(62-88) 131/81 (11/21 0748) SpO2:  [97 %-99 %] 99 % (11/21 0748) Weight:  [89.4 kg] 89.4 kg (11/20 1700)  Physical Exam:  General: alert, cooperative and appears stated age Lochia: appropriate Uterine Fundus: firm DVT Evaluation: No evidence of DVT seen on physical exam. No cords or calf tenderness. No significant calf/ankle edema.    Data Review Recent Labs    08/09/19 1816 08/10/19 0554  HGB 11.6* 10.2*  HCT 34.0* 30.0*    Assessment/Plan: Active Problems:   Labor and delivery, indication for care   Plan for discharge tomorrow   -- Continue routine PP care.     Philip Aspen, CNM  08/10/2019 9:27 AM

## 2019-08-11 MED ORDER — IBUPROFEN 600 MG PO TABS: 600.0000 mg | ORAL_TABLET | Freq: Four times a day (QID) | ORAL | 0 refills | Status: DC

## 2019-08-11 MED ORDER — DOCUSATE SODIUM 100 MG PO CAPS: 100.0000 mg | ORAL_CAPSULE | Freq: Two times a day (BID) | ORAL | 0 refills | Status: DC

## 2019-08-11 NOTE — Progress Notes (Signed)
Spoke with pt about fasting blood sugar ordered for 0600. Per pt Cynthia Harrell, CNM spoke with her yesterday and stated no more blood sugars were necessary in the hospital and that they would recheck at her follow-up in office.   VSS. Will continue to monitor.

## 2019-08-11 NOTE — Final Progress Note (Signed)
Discharge Day SOAP Note:  Progress Note - Vaginal Delivery  Cynthia Harrell is a 33 y.o. [phone] now PP day 2 s/p Vaginal, Spontaneous . Delivery was uncomplicated  Subjective  The patient has the following complaints: has no unusual complaints  Pain is controlled with current medications.   Patient is urinating without difficulty.  She is ambulating well.     Objective  Vital signs: BP 131/80 (BP Location: Right Arm)   Pulse 90   Temp 98.6 F (37 C) (Oral)   Resp 20   Ht 5\' 6"  (1.676 m)   Wt 89.4 kg   LMP 11/15/2018   SpO2 99% Comment: Room Air  Breastfeeding Unknown   BMI 31.80 kg/m   Physical Exam: Gen: NAD Fundus Fundal Tone: Firm  Lochia Amount: Small  Perineum Appearance: Edematous     Data Review Labs: CBC Latest Ref Rng & Units 08/10/2019 08/09/2019 05/31/2019  WBC 4.0 - 10.5 K/uL 14.6(H) 11.6(H) 10.8  Hemoglobin 12.0 - 15.0 g/dL 10.2(L) 11.6(L) 11.2  Hematocrit 36.0 - 46.0 % 30.0(L) 34.0(L) 33.1(L)  Platelets 150 - 400 K/uL 156 176 167   A POS  Assessment/Plan  Active Problems:   Labor and delivery, indication for care    Plan for discharge today.   Discharge Instructions: Per After Visit Summary. Activity: Advance as tolerated. Pelvic rest for 6 weeks.  Also refer to After Visit Summary Diet: Regular Medications: Allergies as of 08/11/2019   No Known Allergies     Medication List    STOP taking these medications   Accu-Chek FastClix Lancets Misc   glucose blood test strip   zolpidem 6.25 MG CR tablet Commonly known as: Ambien CR     TAKE these medications   CitraNatal Assure 35-1 & 300 MG tablet Take 1 tablet by mouth daily.   docusate sodium 100 MG capsule Commonly known as: COLACE Take 1 capsule (100 mg total) by mouth 2 (two) times daily.   ibuprofen 600 MG tablet Commonly known as: ADVIL Take 1 tablet (600 mg total) by mouth every 6 (six) hours.   valACYclovir 500 MG tablet Commonly known as:  VALTREX TAKE 1 TABLET (500 MG TOTAL) BY MOUTH 2 (TWO) TIMES DAILY.      Outpatient follow up: 6 wks postpartum  Postpartum contraception: She is undecided. Will discuss at first office visit post-partum. Needs 2 hr glucose screen.   Discharged Condition: good  Discharged to: home  Newborn Data: Disposition:home with mother  Apgars: APGAR (1 MIN): 8   APGAR (5 MINS): 9   APGAR (10 MINS):    Baby Feeding: Breast  Philip Aspen, CNM 08/11/2019 9:47 AM

## 2019-08-11 NOTE — Discharge Summary (Signed)
                              Discharge Summary  Date of Admission: 08/09/2019  Date of Discharge: 08/11/2019  Admitting Diagnosis: Onset of Labor at [redacted]w[redacted]d  Mode of Delivery: normal spontaneous vaginal delivery                 Discharge Diagnosis: No other diagnosis   Intrapartum Procedures: none   Post partum procedures: none  Complications: 1st degree perineal laceration                      Discharge Day SOAP Note:  Progress Note - Vaginal Delivery  Cynthia Harrell is a 33 y.o. [id number] now PP day 2 s/p Vaginal, Spontaneous . Delivery was uncomplicated  Subjective  The patient has the following complaints: has no unusual complaints  Pain is controlled with current medications.   Patient is urinating without difficulty.  She is ambulating well.     Objective  Vital signs: BP 131/80 (BP Location: Right Arm)   Pulse 90   Temp 98.6 F (37 C) (Oral)   Resp 20   Ht 5\' 6"  (1.676 m)   Wt 89.4 kg   LMP 11/15/2018   SpO2 99% Comment: Room Air  Breastfeeding Unknown   BMI 31.80 kg/m   Physical Exam: Gen: NAD Fundus Fundal Tone: Firm  Lochia Amount: Small  Perineum Appearance: Edematous     Data Review Labs: CBC Latest Ref Rng & Units 08/10/2019 08/09/2019 05/31/2019  WBC 4.0 - 10.5 K/uL 14.6(H) 11.6(H) 10.8  Hemoglobin 12.0 - 15.0 g/dL 10.2(L) 11.6(L) 11.2  Hematocrit 36.0 - 46.0 % 30.0(L) 34.0(L) 33.1(L)  Platelets 150 - 400 K/uL 156 176 167   A POS  Assessment/Plan  Active Problems:   Labor and delivery, indication for care    Plan for discharge today.   Discharge Instructions: Per After Visit Summary. Activity: Advance as tolerated. Pelvic rest for 6 weeks.  Also refer to After Visit Summary Diet: Regular Medications: Allergies as of 08/11/2019   No Known Allergies     Medication List    STOP taking these medications   Accu-Chek FastClix Lancets Misc   glucose blood test strip   zolpidem 6.25 MG CR tablet Commonly known as: Ambien CR      TAKE these medications   CitraNatal Assure 35-1 & 300 MG tablet Take 1 tablet by mouth daily.   docusate sodium 100 MG capsule Commonly known as: COLACE Take 1 capsule (100 mg total) by mouth 2 (two) times daily.   ibuprofen 600 MG tablet Commonly known as: ADVIL Take 1 tablet (600 mg total) by mouth every 6 (six) hours.   valACYclovir 500 MG tablet Commonly known as: VALTREX TAKE 1 TABLET (500 MG TOTAL) BY MOUTH 2 (TWO) TIMES DAILY.      Outpatient follow up: 6 wks postpartum  Postpartum contraception: She is undecided. Will discuss at first office visit post-partum. Needs 2 hr glucose screen.   Discharged Condition: good  Discharged to: home  Newborn Data: Disposition:home with mother  Apgars: APGAR (1 MIN): 8   APGAR (5 MINS): 9   APGAR (10 MINS):    Baby Feeding: Breast  Philip Aspen, CNM 08/11/2019 9:47 AM

## 2019-08-14 ENCOUNTER — Other Ambulatory Visit: Payer: Medicaid Other

## 2019-08-14 ENCOUNTER — Encounter: Payer: Medicaid Other | Admitting: Certified Nurse Midwife

## 2019-09-10 ENCOUNTER — Telehealth: Payer: Self-pay

## 2019-09-10 ENCOUNTER — Telehealth: Payer: Self-pay | Admitting: Certified Nurse Midwife

## 2019-09-10 NOTE — Telephone Encounter (Signed)
Pt called in stated she thinks she has a uti or a yeast.  Pt is having lower pain. Pt has some discharge. Pt is wanting to come in wanted to ask if you want her to come in for a drop off or schud her. Please advise & let me know. Thank you

## 2019-09-11 NOTE — Telephone Encounter (Signed)
mychart message sent to patient

## 2019-09-18 ENCOUNTER — Encounter: Payer: Medicaid Other | Admitting: Obstetrics and Gynecology

## 2019-09-19 ENCOUNTER — Telehealth: Payer: Self-pay | Admitting: Certified Nurse Midwife

## 2019-09-23 ENCOUNTER — Other Ambulatory Visit (HOSPITAL_COMMUNITY)
Admission: RE | Admit: 2019-09-23 | Discharge: 2019-09-23 | Disposition: A | Discharge status: Home / Self Care | Payer: Medicaid Other | Source: Ambulatory Visit | Attending: Certified Nurse Midwife | Admitting: Certified Nurse Midwife

## 2019-09-23 ENCOUNTER — Other Ambulatory Visit: Payer: Self-pay

## 2019-09-23 ENCOUNTER — Encounter: Payer: Self-pay | Admitting: Certified Nurse Midwife

## 2019-09-23 ENCOUNTER — Other Ambulatory Visit: Payer: Medicaid Other

## 2019-09-23 ENCOUNTER — Ambulatory Visit (INDEPENDENT_AMBULATORY_CARE_PROVIDER_SITE_OTHER): Payer: Medicaid Other | Admitting: Certified Nurse Midwife

## 2019-09-23 VITALS — BP 98/62 | HR 78 | Ht 66.0 in | Wt 173.5 lb

## 2019-09-23 DIAGNOSIS — N898 Other specified noninflammatory disorders of vagina: Secondary | ICD-10-CM

## 2019-09-23 DIAGNOSIS — Z1389 Encounter for screening for other disorder: Secondary | ICD-10-CM | POA: Diagnosis not present

## 2019-09-23 DIAGNOSIS — Z3403 Encounter for supervision of normal first pregnancy, third trimester: Secondary | ICD-10-CM | POA: Diagnosis not present

## 2019-09-23 DIAGNOSIS — R350 Frequency of micturition: Secondary | ICD-10-CM | POA: Diagnosis not present

## 2019-09-23 MED ORDER — NORELGESTROMIN-ETH ESTRADIOL 150-35 MCG/24HR TD PTWK: 1.0000 | MEDICATED_PATCH | TRANSDERMAL | 12 refills | Status: DC

## 2019-09-23 NOTE — Patient Instructions (Signed)
Preventive Care 21-32 Years Old, Female Preventive care refers to visits with your health care provider and lifestyle choices that can promote health and wellness. This includes:  A yearly physical exam. This may also be called an annual well check.  Regular dental visits and eye exams.  Immunizations.  Screening for certain conditions.  Healthy lifestyle choices, such as eating a healthy diet, getting regular exercise, not using drugs or products that contain nicotine and tobacco, and limiting alcohol use. What can I expect for my preventive care visit? Physical exam Your health care provider will check your:  Height and weight. This may be used to calculate body mass index (BMI), which tells if you are at a healthy weight.  Heart rate and blood pressure.  Skin for abnormal spots. Counseling Your health care provider may ask you questions about your:  Alcohol, tobacco, and drug use.  Emotional well-being.  Home and relationship well-being.  Sexual activity.  Eating habits.  Work and work environment.  Method of birth control.  Menstrual cycle.  Pregnancy history. What immunizations do I need?  Influenza (flu) vaccine  This is recommended every year. Tetanus, diphtheria, and pertussis (Tdap) vaccine  You may need a Td booster every 10 years. Varicella (chickenpox) vaccine  You may need this if you have not been vaccinated. Human papillomavirus (HPV) vaccine  If recommended by your health care provider, you may need three doses over 6 months. Measles, mumps, and rubella (MMR) vaccine  You may need at least one dose of MMR. You may also need a second dose. Meningococcal conjugate (MenACWY) vaccine  One dose is recommended if you are age 19-21 years and a first-year college student living in a residence hall, or if you have one of several medical conditions. You may also need additional booster doses. Pneumococcal conjugate (PCV13) vaccine  You may need  this if you have certain conditions and were not previously vaccinated. Pneumococcal polysaccharide (PPSV23) vaccine  You may need one or two doses if you smoke cigarettes or if you have certain conditions. Hepatitis A vaccine  You may need this if you have certain conditions or if you travel or work in places where you may be exposed to hepatitis A. Hepatitis B vaccine  You may need this if you have certain conditions or if you travel or work in places where you may be exposed to hepatitis B. Haemophilus influenzae type b (Hib) vaccine  You may need this if you have certain conditions. You may receive vaccines as individual doses or as more than one vaccine together in one shot (combination vaccines). Talk with your health care provider about the risks and benefits of combination vaccines. What tests do I need?  Blood tests  Lipid and cholesterol levels. These may be checked every 5 years starting at age 20.  Hepatitis C test.  Hepatitis B test. Screening  Diabetes screening. This is done by checking your blood sugar (glucose) after you have not eaten for a while (fasting).  Sexually transmitted disease (STD) testing.  BRCA-related cancer screening. This may be done if you have a family history of breast, ovarian, tubal, or peritoneal cancers.  Pelvic exam and Pap test. This may be done every 3 years starting at age 21. Starting at age 30, this may be done every 5 years if you have a Pap test in combination with an HPV test. Talk with your health care provider about your test results, treatment options, and if necessary, the need for more tests.   Follow these instructions at home: Eating and drinking   Eat a diet that includes fresh fruits and vegetables, whole grains, lean protein, and low-fat dairy.  Take vitamin and mineral supplements as recommended by your health care provider.  Do not drink alcohol if: ? Your health care provider tells you not to drink. ? You are  pregnant, may be pregnant, or are planning to become pregnant.  If you drink alcohol: ? Limit how much you have to 0-1 drink a day. ? Be aware of how much alcohol is in your drink. In the [place] one drink equals one 12 oz bottle of beer (355 mL), one 5 oz glass of wine (148 mL), or one 1 oz glass of hard liquor (44 mL). Lifestyle  Take daily care of your teeth and gums.  Stay active. Exercise for at least 30 minutes on 5 or more days each week.  Do not use any products that contain nicotine or tobacco, such as cigarettes, e-cigarettes, and chewing tobacco. If you need help quitting, ask your health care provider.  If you are sexually active, practice safe sex. Use a condom or other form of birth control (contraception) in order to prevent pregnancy and STIs (sexually transmitted infections). If you plan to become pregnant, see your health care provider for a preconception visit. What's next?  Visit your health care provider once a year for a well check visit.  Ask your health care provider how often you should have your eyes and teeth checked.  Stay up to date on all vaccines. This information is not intended to replace advice given to you by your health care provider. Make sure you discuss any questions you have with your health care provider. Document Revised: 05/17/2018 Document Reviewed: 05/17/2018 Elsevier Patient Education  2020 [employer].

## 2019-09-23 NOTE — Progress Notes (Signed)
Subjective:    Cynthia Harrell is a 34 y.o. [phone] Caucasian female who presents for a postpartum visit. She is 6 weeks postpartum following a spontaneous vaginal delivery at 37.6 gestational weeks. Anesthesia: none. I have fully reviewed the prenatal and intrapartum course. Postpartum course has been WNL. Baby's course has been WNL. Baby is feeding by bottle. Bleeding no bleeding. Bowel function is normal. Bladder function is normal. Patient is not sexually active.. Contraception method is Ortho-Evra patches weekly. Postpartum depression screening: negative. Score 0.  Last pap 09/14/2018 and was negative HPV neg.  The following portions of the patient's history were reviewed and updated as appropriate: allergies, current medications, past medical history, past surgical history and problem list.  Review of Systems Pertinent items are noted in HPI.   Vitals:   09/23/19 0833  BP: 98/62  Pulse: 78  Weight: 173 lb 8 oz (78.7 kg)  Height: 5\' 6"  (1.676 m)   Patient's last menstrual period was 11/15/2018.  Objective:   General:  alert, cooperative and no distress   Breasts:  deferred, no complaints  Lungs: clear to auscultation bilaterally  Heart:  regular rate and rhythm  Abdomen: soft, nontender   Vulva: normal  Vagina: normal vagina  Cervix:  closed  Corpus: Well-involuted  Adnexa:  Non-palpable  Rectal Exam: No hemorrhoids        Assessment:   Postpartum exam 6 wks s/p SVD Bottle feeding Depression screening Contraception counseling   Plan:  : Ortho-Evra patches weekly, order placed to start today Follow up in: 1 year for annual or earlier if needed  Philip Aspen, CNM

## 2019-09-23 NOTE — Progress Notes (Signed)
Patient is here for a post partum visit. Is bottle feeding. Has not had a period. Is interested in birth control but isn't sure what form. Has not resumed intercourse. PhQ9 0

## 2019-09-24 ENCOUNTER — Other Ambulatory Visit: Payer: Self-pay | Admitting: Certified Nurse Midwife

## 2019-09-24 LAB — CERVICOVAGINAL ANCILLARY ONLY
Bacterial Vaginitis (gardnerella): POSITIVE — AB
Candida Glabrata: NEGATIVE
Candida Vaginitis: NEGATIVE
Comment: NEGATIVE
Comment: NEGATIVE
Comment: NEGATIVE

## 2019-09-24 LAB — GLUCOSE, TWO-HOUR POSTPRANDIAL: Glucose, Two-Hour Postprandial: 87 mg/dL (ref 65–139)

## 2019-09-24 MED ORDER — METRONIDAZOLE 500 MG PO TABS: 500.0000 mg | ORAL_TABLET | Freq: Two times a day (BID) | ORAL | 0 refills | Status: AC

## 2019-09-24 NOTE — Progress Notes (Signed)
Swab positive for BV, orders placed for treatment.   Almeter Westhoff, CNM  

## 2019-09-25 LAB — URINE CULTURE

## 2019-09-27 NOTE — Telephone Encounter (Signed)
error 

## 2019-10-18 ENCOUNTER — Telehealth: Payer: Self-pay

## 2019-10-18 ENCOUNTER — Other Ambulatory Visit: Payer: Self-pay | Admitting: Certified Nurse Midwife

## 2019-10-18 MED ORDER — NORGESTIMATE-ETH ESTRADIOL 0.25-35 MG-MCG PO TABS: 1.0000 | ORAL_TABLET | Freq: Every day | ORAL | 11 refills | Status: DC

## 2019-10-18 NOTE — Telephone Encounter (Signed)
Information on IUD mailed to patient per AT request.

## 2019-10-18 NOTE — Progress Notes (Signed)
Pt request to change from patches to pill. Orders placed.   Philip Aspen, CNM

## 2019-11-26 ENCOUNTER — Encounter: Payer: Self-pay | Admitting: Certified Nurse Midwife

## 2019-11-26 ENCOUNTER — Ambulatory Visit (INDEPENDENT_AMBULATORY_CARE_PROVIDER_SITE_OTHER): Payer: Medicaid Other | Admitting: Certified Nurse Midwife

## 2019-11-26 ENCOUNTER — Other Ambulatory Visit: Payer: Self-pay

## 2019-11-26 VITALS — BP 112/79 | HR 85 | Ht 66.0 in | Wt 167.2 lb

## 2019-11-26 DIAGNOSIS — Z3202 Encounter for pregnancy test, result negative: Secondary | ICD-10-CM

## 2019-11-26 DIAGNOSIS — Z30017 Encounter for initial prescription of implantable subdermal contraceptive: Secondary | ICD-10-CM | POA: Diagnosis not present

## 2019-11-26 LAB — POCT URINE PREGNANCY: Preg Test, Ur: NEGATIVE

## 2019-11-26 NOTE — Patient Instructions (Signed)
Nexplanon Instructions After Insertion  Keep bandage clean and dry for 24 hours  May use ice/Tylenol/Ibuprofen for soreness or pain  If you develop fever, drainage or increased warmth from incision site-contact office immediately   

## 2019-11-26 NOTE — Progress Notes (Signed)
Cynthia Harrell is a 34 y.o. year old Caucasian female here for Nexplanon insertion.  Patient's last menstrual period was 11/17/2019 (approximate)., Risks/benefits/side effects of Nexplanon have been discussed and her questions have been answered.  Specifically, a failure rate of 09/998 has been reported, with an increased failure rate if pt takes [place] and/or antiseizure medicaitons.  Cynthia Harrell is aware of the common side effect of irregular bleeding, which the incidence of decreases over time.  BP 112/79   Pulse 85   Ht 5\' 6"  (1.676 m)   Wt 167 lb 4 oz (75.9 kg)   LMP 11/17/2019 (Approximate)   BMI 26.99 kg/m   Results for orders placed or performed in visit on 11/26/19 (from the past 24 hour(s))  POCT urine pregnancy   Collection Time: 11/26/19  9:50 AM  Result Value Ref Range   Preg Test, Ur Negative Negative     She is right-handed, so her left arm, approximately 4 inches proximal from the elbow, was cleansed with alcohol and anesthetized with 2cc of 2% Lidocaine.  The area was cleansed again with betadine and the Nexplanon was inserted per manufacturer's recommendations without difficulty.  A steri-strip and pressure bandage were applied.  Pt was instructed to keep the area clean and dry, remove pressure bandage in 24 hours, and keep insertion site covered with the steri-strip for 3-5 days.  Back up contraception was recommended for 2 weeks.  She was given a card indicating date Nexplanon was inserted and date it needs to be removed. Follow-up PRN problems.  Cynthia Harrell,CNM  Body mass index is 26.99 kg/m.

## 2020-02-14 ENCOUNTER — Other Ambulatory Visit: Payer: Self-pay | Admitting: Surgical

## 2020-02-14 DIAGNOSIS — N390 Urinary tract infection, site not specified: Secondary | ICD-10-CM

## 2020-02-14 MED ORDER — VALACYCLOVIR HCL 500 MG PO TABS: ORAL_TABLET | ORAL | 1 refills | Status: DC

## 2020-03-10 ENCOUNTER — Encounter: Payer: Self-pay | Admitting: Certified Nurse Midwife

## 2020-03-10 ENCOUNTER — Other Ambulatory Visit (HOSPITAL_COMMUNITY)
Admission: RE | Admit: 2020-03-10 | Discharge: 2020-03-10 | Disposition: A | Discharge status: Home / Self Care | Payer: Medicaid Other | Source: Ambulatory Visit | Attending: Certified Nurse Midwife | Admitting: Certified Nurse Midwife

## 2020-03-10 ENCOUNTER — Ambulatory Visit (INDEPENDENT_AMBULATORY_CARE_PROVIDER_SITE_OTHER): Payer: Medicaid Other | Admitting: Certified Nurse Midwife

## 2020-03-10 ENCOUNTER — Other Ambulatory Visit: Payer: Self-pay

## 2020-03-10 VITALS — BP 124/72 | HR 83 | Ht 66.0 in | Wt 173.2 lb

## 2020-03-10 DIAGNOSIS — N898 Other specified noninflammatory disorders of vagina: Secondary | ICD-10-CM | POA: Diagnosis not present

## 2020-03-10 DIAGNOSIS — Z202 Contact with and (suspected) exposure to infections with a predominantly sexual mode of transmission: Secondary | ICD-10-CM | POA: Insufficient documentation

## 2020-03-10 NOTE — Progress Notes (Signed)
GYN ENCOUNTER NOTE  Subjective:       Cynthia Harrell is a 34 y.o. [phone] female is here for gynecologic evaluation of the following issues:  1. Increased  Vaginal discharge with irritation . She state she also potential had an exposure to oral gonorrhea . She found out she drank after person that has oral gonorrhea and she would now like to be tested. She states that she did has some throat irritation.    Gynecologic History Patient's last menstrual period was 02/20/2020 (exact date). Contraception: Nexplanon Last Pap: 09/14/2018. Results were: normal Last mammogram: n/a   Obstetric History OB History  Gravida Para Term Preterm AB Living  4 3 3   1 3   SAB TAB Ectopic Multiple Live Births    1   0 3    # Outcome Date GA Lbr Len/2nd Weight Sex Delivery Anes PTL Lv  4 Term 08/09/19 [redacted]w[redacted]d  8 lb 13.8 oz (4.02 kg) F Vag-Spont None  LIV  3 Term 11/06/17 [redacted]w[redacted]d / 00:43 9 lb 4.9 oz (4.22 kg) F Vag-Spont EPI  LIV  2 Term 12/04/06 [redacted]w[redacted]d  8 lb 1 oz (3.657 kg) F Vag-Spont EPI N LIV  1 TAB      TAB       Past Medical History:  Diagnosis Date  . Gestational diabetes   . 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 High-risk sexual behavior   . Microalbuminuria     Past Surgical History:  Procedure Laterality Date  . THERAPEUTIC ABORTION  2008    Current Outpatient Medications on File Prior to Visit  Medication Sig Dispense Refill  . etonogestrel (NEXPLANON) 68 MG IMPL implant 1 each by Subdermal route once.    . valACYclovir (VALTREX) 500 MG tablet TAKE 1 TABLET (500 MG TOTAL) BY MOUTH 2 (TWO) TIMES DAILY. 60 tablet 1  . docusate sodium (COLACE) 100 MG capsule Take 1 capsule (100 mg total) by mouth 2 (two) times daily. (Patient not taking: Reported on 09/23/2019) 10 capsule 0  . ibuprofen (ADVIL) 600 MG tablet Take 1 tablet (600 mg total) by mouth every 6 (six) hours. (Patient not taking: Reported on 09/23/2019) 30 tablet 0  . norgestimate-ethinyl estradiol (ORTHO-CYCLEN) 0.25-35 MG-MCG tablet  Take 1 tablet by mouth daily. (Patient not taking: Reported on 11/26/2019) 1 Package 11  . Prenat w/o A-FeCbGl-DSS-FA-DHA (CITRANATAL ASSURE) 35-1 & 300 MG tablet Take 1 tablet by mouth daily. (Patient not taking: Reported on 09/23/2019) 30 each 6   No current facility-administered medications on file prior to visit.    No Known Allergies  Social History   Socioeconomic History  . Marital status: Significant Other    Spouse name: Doren Custard   . Number of children: Not on file  . Years of education: Not on file  . Highest education level: Not on file  Occupational History  . Not on file  Tobacco Use  . Smoking status: Never Smoker  . Smokeless tobacco: Never Used  Vaping Use  . Vaping Use: Never used  Substance and Sexual Activity  . Alcohol use: No    Alcohol/week: 0.0 standard drinks  . Drug use: No  . Sexual activity: Yes    Comment: undecided  Other Topics Concern  . Not on file  Social History Narrative  . Not on file   Social Determinants of Health   Financial Resource Strain: Low Risk   . Difficulty of Paying Living Expenses: Not hard at all  Food Insecurity: No Food Insecurity  .  Worried About Charity fundraiser in the Last Year: Never true  . Ran Out of Food in the Last Year: Never true  Transportation Needs: No Transportation Needs  . Lack of Transportation (Medical): No  . Lack of Transportation (Non-Medical): No  Physical Activity: Insufficiently Active  . Days of Exercise per Week: 3 days  . Minutes of Exercise per Session: 30 min  Stress: No Stress Concern Present  . Feeling of Stress : Only a little  Social Connections: Unknown  . Frequency of Communication with Friends and Family: More than three times a week  . Frequency of Social Gatherings with Friends and Family: More than three times a week  . Attends Religious Services: Patient refused  . Active Member of Clubs or Organizations: Patient refused  . Attends Archivist Meetings: Patient  refused  . Marital Status: Living with partner  Intimate Partner Violence:   . Fear of Current or Ex-Partner:   . Emotionally Abused:   Marland Kitchen Physically Abused:   . Sexually Abused:     Family History  Problem Relation Age of Onset  . Diabetes Father   . Hypertension Mother   . Cancer Mother        melanoma  . Stroke Maternal Grandfather   . Stroke Paternal Grandfather   . COPD Neg Hx   . Heart disease Neg Hx     The following portions of the patient's history were reviewed and updated as appropriate: allergies, current medications, past family history, past medical history, past social history, past surgical history and problem list.  Review of Systems Review of Systems - Negative except as mentioned in HPI Review of Systems - General ROS: negative for - chills, fatigue, fever, hot flashes, malaise or night sweats Hematological and Lymphatic ROS: negative for - bleeding problems or swollen lymph nodes Gastrointestinal ROS: negative for - abdominal pain, blood in stools, change in bowel habits and nausea/vomiting Musculoskeletal ROS: negative for - joint pain, muscle pain or muscular weakness Genito-Urinary ROS: negative for - change in menstrual cycle, dysmenorrhea, dyspareunia, dysuria,  genital ulcers, hematuria, incontinence, irregular/heavy menses, nocturia or pelvic pain. Positive for increased vaginal discharge and irritation.   Objective:   BP 124/72   Pulse 83   Ht 5\' 6"  (1.676 m)   Wt 173 lb 3 oz (78.6 kg)   LMP 02/20/2020 (Exact Date)   Breastfeeding No   BMI 27.95 kg/m  CONSTITUTIONAL: Well-developed, well-nourished female in no acute distress.  HENT:  Normocephalic, atraumatic.  NECK: Normal range of motion, supple, no masses.  Normal thyroid.  SKIN: Skin is warm and dry. No rash noted. Not diaphoretic. No erythema. No pallor. [place]: Alert and oriented to person, place, and time. PSYCHIATRIC: Normal mood and affect. Normal behavior. Normal judgment and  thought content. CARDIOVASCULAR:Not Examined RESPIRATORY: Not Examined BREASTS: Not Examined ABDOMEN: Soft, non distended; Non tender.  No Organomegaly. PELVIC:  External Genitalia: Normal  BUS: Normal  Vagina: Normal  Cervix: Normal, normal discharge, no odor.   MUSCULOSKELETAL: Normal range of motion. No tenderness.  No cyanosis, clubbing, or edema.     Assessment:   Vaginal discharge Possible exposure STD   Plan:   Oral and vaginal swabs collected. Will follow up with results. Sample of boric acid vaginal suppositories given  With instructions on use. Will follow up with results. Return prn or for annual exam.   Philip Aspen, CNM

## 2020-03-10 NOTE — Patient Instructions (Signed)
Vaginitis Vaginitis is a condition in which the vaginal tissue swells and becomes red (inflamed). This condition is most often caused by a change in the normal balance of bacteria and yeast that live in the vagina. This change causes an overgrowth of certain bacteria or yeast, which causes the inflammation. There are different types of vaginitis, but the most common types are:  Bacterial vaginosis.  Yeast infection (candidiasis).  Trichomoniasis vaginitis. This is a sexually transmitted disease (STD).  Viral vaginitis.  Atrophic vaginitis.  Allergic vaginitis. What are the causes? The cause of this condition depends on the type of vaginitis. It can be caused by:  Bacteria (bacterial vaginosis).  Yeast, which is a fungus (yeast infection).  A parasite (trichomoniasis vaginitis).  A virus (viral vaginitis).  Low hormone levels (atrophic vaginitis). Low hormone levels can occur during pregnancy, breastfeeding, or after menopause.  Irritants, such as bubble baths, scented tampons, and feminine sprays (allergic vaginitis). Other factors can change the normal balance of the yeast and bacteria that live in the vagina. These include:  Antibiotic medicines.  Poor hygiene.  Diaphragms, vaginal sponges, spermicides, birth control pills, and intrauterine devices (IUD).  Sex.  Infection.  Uncontrolled diabetes.  A weakened defense (immune) system. What increases the risk? This condition is more likely to develop in women who:  Smoke.  Use vaginal douches, scented tampons, or scented sanitary pads.  Wear tight-fitting pants.  Wear thong underwear.  Use oral birth control pills or an IUD.  Have sex without a condom.  Have multiple sex partners.  Have an STD.  Frequently use the spermicide nonoxynol-9.  Eat lots of foods high in sugar.  Have uncontrolled diabetes.  Have low estrogen levels.  Have a weakened immune system from an immune disorder or medical  treatment.  Are pregnant or breastfeeding. What are the signs or symptoms? Symptoms vary depending on the cause of the vaginitis. Common symptoms include:  Abnormal vaginal discharge. ? The discharge is white, gray, or yellow with bacterial vaginosis. ? The discharge is thick, white, and cheesy with a yeast infection. ? The discharge is frothy and yellow or greenish with trichomoniasis.  A bad vaginal smell. The smell is fishy with bacterial vaginosis.  Vaginal itching, pain, or swelling.  Sex that is painful.  Pain or burning when urinating. Sometimes there are no symptoms. How is this diagnosed? This condition is diagnosed based on your symptoms and medical history. A physical exam, including a pelvic exam, will also be done. You may also have other tests, including:  Tests to determine the pH level (acidity or alkalinity) of your vagina.  A whiff test, to assess the odor that results when a sample of your vaginal discharge is mixed with a potassium hydroxide solution.  Tests of vaginal fluid. A sample will be examined under a microscope. How is this treated? Treatment varies depending on the type of vaginitis you have. Your treatment may include:  Antibiotic creams or pills to treat bacterial vaginosis and trichomoniasis.  Antifungal medicines, such as vaginal creams or suppositories, to treat a yeast infection.  Medicine to ease discomfort if you have viral vaginitis. Your sexual partner should also be treated.  Estrogen delivered in a cream, pill, suppository, or vaginal ring to treat atrophic vaginitis. If vaginal dryness occurs, lubricants and moisturizing creams may help. You may need to avoid scented soaps, sprays, or douches.  Stopping use of a product that is causing allergic vaginitis. Then using a vaginal cream to treat the symptoms. Follow   these instructions at home: Lifestyle  Keep your genital area clean and dry. Avoid soap, and only rinse the area with  water.  Do not douche or use tampons until your health care provider says it is okay to do so. Use sanitary pads, if needed.  Do not have sex until your health care provider approves. When you can return to sex, practice safe sex and use condoms.  Wipe from front to back. This avoids the spread of bacteria from the rectum to the vagina. General instructions  Take over-the-counter and prescription medicines only as told by your health care provider.  If you were prescribed an antibiotic medicine, take or use it as told by your health care provider. Do not stop taking or using the antibiotic even if you start to feel better.  Keep all follow-up visits as told by your health care provider. This is important. How is this prevented?  Use mild, non-scented products. Do not use things that can irritate the vagina, such as fabric softeners. Avoid the following products if they are scented: ? Feminine sprays. ? Detergents. ? Tampons. ? Feminine hygiene products. ? Soaps or bubble baths.  Let air reach your genital area. ? Wear cotton underwear to reduce moisture buildup. ? Avoid wearing underwear while you sleep. ? Avoid wearing tight pants and underwear or nylons without a cotton panel. ? Avoid wearing thong underwear.  Take off any wet clothing, such as bathing suits, as soon as possible.  Practice safe sex and use condoms. Contact a health care provider if:  You have abdominal pain.  You have a fever.  You have symptoms that last for more than 2-3 days. Get help right away if:  You have a fever and your symptoms suddenly get worse. Summary  Vaginitis is a condition in which the vaginal tissue becomes inflamed.This condition is most often caused by a change in the normal balance of bacteria and yeast that live in the vagina.  Treatment varies depending on the type of vaginitis you have.  Do not douche, use tampons , or have sex until your health care provider approves. When  you can return to sex, practice safe sex and use condoms. This information is not intended to replace advice given to you by your health care provider. Make sure you discuss any questions you have with your health care provider. Document Revised: 08/18/2017 Document Reviewed: 10/11/2016 Elsevier Patient Education  2020 [employer] Inc[employer]  

## 2020-03-12 LAB — CERVICOVAGINAL ANCILLARY ONLY
Bacterial Vaginitis (gardnerella): NEGATIVE
Candida Glabrata: NEGATIVE
Candida Vaginitis: NEGATIVE
Chlamydia: NEGATIVE
Chlamydia: NEGATIVE
Comment: NEGATIVE
Comment: NORMAL
Comment: NEGATIVE
Comment: NEGATIVE
Comment: NEGATIVE
Comment: NEGATIVE
Comment: NEGATIVE
Comment: NORMAL
Neisseria Gonorrhea: NEGATIVE
Neisseria Gonorrhea: NEGATIVE
Trichomonas: POSITIVE — AB

## 2020-03-13 ENCOUNTER — Telehealth: Payer: Self-pay | Admitting: Certified Nurse Midwife

## 2020-03-13 ENCOUNTER — Other Ambulatory Visit: Payer: Self-pay | Admitting: Certified Nurse Midwife

## 2020-03-13 MED ORDER — METRONIDAZOLE 500 MG PO TABS: 2000.0000 mg | ORAL_TABLET | Freq: Once | ORAL | 0 refills | Status: AC

## 2020-03-13 NOTE — Progress Notes (Signed)
Pt swab positive trichomoniasis. Orders placed for treatment.   Philip Aspen, CNM

## 2020-03-13 NOTE — Telephone Encounter (Signed)
Patient called in saying she received her test results via MyChart. Patient would like someone to explain them to her. Could you please advise?

## 2020-03-17 ENCOUNTER — Telehealth: Payer: Self-pay

## 2020-03-17 NOTE — Telephone Encounter (Signed)
A call was placed to patient- patient had a lot of questions about the positive culture results. All questions answered and appointment made for a test of cure on 03/24/20 with Philip Aspen CNM

## 2020-03-24 ENCOUNTER — Encounter: Payer: Self-pay | Admitting: Certified Nurse Midwife

## 2020-03-31 ENCOUNTER — Other Ambulatory Visit: Payer: Self-pay

## 2020-03-31 ENCOUNTER — Other Ambulatory Visit (HOSPITAL_COMMUNITY)
Admission: RE | Admit: 2020-03-31 | Discharge: 2020-03-31 | Disposition: A | Discharge status: Home / Self Care | Payer: Medicaid Other | Source: Ambulatory Visit | Attending: Certified Nurse Midwife | Admitting: Certified Nurse Midwife

## 2020-03-31 ENCOUNTER — Ambulatory Visit (INDEPENDENT_AMBULATORY_CARE_PROVIDER_SITE_OTHER): Payer: Medicaid Other | Admitting: Certified Nurse Midwife

## 2020-03-31 ENCOUNTER — Encounter: Payer: Self-pay | Admitting: Certified Nurse Midwife

## 2020-03-31 VITALS — BP 101/74 | HR 89 | Ht 66.0 in | Wt 174.2 lb

## 2020-03-31 DIAGNOSIS — Z202 Contact with and (suspected) exposure to infections with a predominantly sexual mode of transmission: Secondary | ICD-10-CM | POA: Insufficient documentation

## 2020-03-31 NOTE — Progress Notes (Signed)
GYN ENCOUNTER NOTE  Subjective:       Cynthia Harrell is a 34 y.o. [phone] female is here for gynecologic evaluation of the following issues:  1. Was seen several weeks ago for vaginal discharge and test positive for trichomonas. She presents today for TOC.      Gynecologic History Patient's last menstrual period was 03/19/2020 (exact date). Contraception: Nexplanon Last Pap: 09/14/2018. Results were: normal Last mammogram: n/a .   Obstetric History OB History  Gravida Para Term Preterm AB Living  4 3 3   1 3   SAB TAB Ectopic Multiple Live Births    1   0 3    # Outcome Date GA Lbr Len/2nd Weight Sex Delivery Anes PTL Lv  4 Term 08/09/19 [redacted]w[redacted]d  8 lb 13.8 oz (4.02 kg) F Vag-Spont None  LIV  3 Term 11/06/17 [redacted]w[redacted]d / 00:43 9 lb 4.9 oz (4.22 kg) F Vag-Spont EPI  LIV  2 Term 12/04/06 [redacted]w[redacted]d  8 lb 1 oz (3.657 kg) F Vag-Spont EPI N LIV  1 TAB      TAB       Past Medical History:  Diagnosis Date  . Gestational diabetes   . 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 High-risk sexual behavior   . Microalbuminuria     Past Surgical History:  Procedure Laterality Date  . THERAPEUTIC ABORTION  2008    Current Outpatient Medications on File Prior to Visit  Medication Sig Dispense Refill  . etonogestrel (NEXPLANON) 68 MG IMPL implant 1 each by Subdermal route once.    . valACYclovir (VALTREX) 500 MG tablet TAKE 1 TABLET (500 MG TOTAL) BY MOUTH 2 (TWO) TIMES DAILY. 60 tablet 1   No current facility-administered medications on file prior to visit.    No Known Allergies  Social History   Socioeconomic History  . Marital status: Significant Other    Spouse name: Doren Custard   . Number of children: Not on file  . Years of education: Not on file  . Highest education level: Not on file  Occupational History  . Not on file  Tobacco Use  . Smoking status: Never Smoker  . Smokeless tobacco: Never Used  Vaping Use  . Vaping Use: Never used  Substance and Sexual Activity  . Alcohol use:  No    Alcohol/week: 0.0 standard drinks  . Drug use: No  . Sexual activity: Yes    Comment: undecided  Other Topics Concern  . Not on file  Social History Narrative  . Not on file   Social Determinants of Health   Financial Resource Strain: Low Risk   . Difficulty of Paying Living Expenses: Not hard at all  Food Insecurity: No Food Insecurity  . Worried About Charity fundraiser in the Last Year: Never true  . Ran Out of Food in the Last Year: Never true  Transportation Needs: No Transportation Needs  . Lack of Transportation (Medical): No  . Lack of Transportation (Non-Medical): No  Physical Activity: Insufficiently Active  . Days of Exercise per Week: 3 days  . Minutes of Exercise per Session: 30 min  Stress: No Stress Concern Present  . Feeling of Stress : Only a little  Social Connections: Unknown  . Frequency of Communication with Friends and Family: More than three times a week  . Frequency of Social Gatherings with Friends and Family: More than three times a week  . Attends Religious Services: Patient refused  . Active Member of Clubs or  Organizations: Patient refused  . Attends Archivist Meetings: Patient refused  . Marital Status: Living with partner  Intimate Partner Violence:   . Fear of Current or Ex-Partner:   . Emotionally Abused:   Marland Kitchen Physically Abused:   . Sexually Abused:     Family History  Problem Relation Age of Onset  . Diabetes Father   . Hypertension Mother   . Cancer Mother        melanoma  . Stroke Maternal Grandfather   . Stroke Paternal Grandfather   . COPD Neg Hx   . Heart disease Neg Hx     The following portions of the patient's history were reviewed and updated as appropriate: allergies, current medications, past family history, past medical history, past social history, past surgical history and problem list.  Review of Systems Review of Systems - Negative except as mentioned in HPI Review of Systems - General ROS:  negative for - chills, fatigue, fever, hot flashes, malaise or night sweats Hematological and Lymphatic ROS: negative for - bleeding problems or swollen lymph nodes Gastrointestinal ROS: negative for - abdominal pain, blood in stools, change in bowel habits and nausea/vomiting Musculoskeletal ROS: negative for - joint pain, muscle pain or muscular weakness Genito-Urinary ROS: negative for - change in menstrual cycle, dysmenorrhea, dyspareunia, dysuria, genital discharge, genital ulcers, hematuria, incontinence, irregular/heavy menses, nocturia or pelvic pain  Objective:   BP 101/74   Pulse 89   Ht 5\' 6"  (1.676 m)   Wt 174 lb 4 oz (79 kg)   LMP 03/19/2020 (Exact Date)   BMI 28.12 kg/m  CONSTITUTIONAL: Well-developed, well-nourished female in no acute distress.  HENT:  Normocephalic, atraumatic.  NECK: Normal range of motion, supple, no masses.  Normal thyroid.  SKIN: Skin is warm and dry. No rash noted. Not diaphoretic. No erythema. No pallor. [place]: Alert and oriented to person, place, and time. PSYCHIATRIC: Normal mood and affect. Normal behavior. Normal judgment and thought content. CARDIOVASCULAR:Not Examined RESPIRATORY: Not Examined BREASTS: Not Examined ABDOMEN: Soft, non distended; Non tender.  No Organomegaly. PELVIC:  External Genitalia: Normal  BUS: Normal  Vagina: Normal MUSCULOSKELETAL: Normal range of motion. No tenderness.  No cyanosis, clubbing, or edema.     Assessment:   Previous history STD   Plan:   Swab collected , will follow up with results. Return prn or for annual exam.   Philip Aspen, CNM

## 2020-04-02 LAB — CERVICOVAGINAL ANCILLARY ONLY
Bacterial Vaginitis (gardnerella): POSITIVE — AB
Candida Glabrata: NEGATIVE
Candida Vaginitis: NEGATIVE
Chlamydia: NEGATIVE
Comment: NEGATIVE
Comment: NEGATIVE
Comment: NEGATIVE
Comment: NEGATIVE
Comment: NEGATIVE
Comment: NORMAL
Neisseria Gonorrhea: NEGATIVE
Trichomonas: NEGATIVE

## 2020-04-03 ENCOUNTER — Other Ambulatory Visit: Payer: Self-pay

## 2020-04-03 ENCOUNTER — Telehealth: Payer: Self-pay | Admitting: Certified Nurse Midwife

## 2020-04-03 ENCOUNTER — Telehealth: Payer: Self-pay

## 2020-04-03 MED ORDER — METRONIDAZOLE 500 MG PO TABS: 500.0000 mg | ORAL_TABLET | Freq: Two times a day (BID) | ORAL | 0 refills | Status: DC

## 2020-04-03 NOTE — Telephone Encounter (Signed)
Pt called in and stated that she got her test results back and she was positive for Bacterial Vaginitis and wanted to know when the meds would be sent in. The pt uses [facility] on [address]. Please advise

## 2020-04-03 NOTE — Telephone Encounter (Signed)
Flagyl sent to preferred pharmacy. Mychart message sent to patient

## 2020-04-03 NOTE — Telephone Encounter (Signed)
mychart message sent to patient

## 2020-04-07 ENCOUNTER — Other Ambulatory Visit: Payer: Self-pay | Admitting: Certified Nurse Midwife

## 2020-04-07 MED ORDER — METRONIDAZOLE 500 MG PO TABS: 500.0000 mg | ORAL_TABLET | Freq: Two times a day (BID) | ORAL | 0 refills | Status: AC

## 2020-04-20 DIAGNOSIS — Z1152 Encounter for screening for COVID-19: Secondary | ICD-10-CM | POA: Diagnosis not present

## 2020-04-20 DIAGNOSIS — Z03818 Encounter for observation for suspected exposure to other biological agents ruled out: Secondary | ICD-10-CM | POA: Diagnosis not present

## 2020-05-09 DIAGNOSIS — F4323 Adjustment disorder with mixed anxiety and depressed mood: Secondary | ICD-10-CM | POA: Diagnosis not present

## 2020-07-03 DIAGNOSIS — Z03818 Encounter for observation for suspected exposure to other biological agents ruled out: Secondary | ICD-10-CM | POA: Diagnosis not present

## 2020-07-03 DIAGNOSIS — Z20822 Contact with and (suspected) exposure to covid-19: Secondary | ICD-10-CM | POA: Diagnosis not present

## 2020-07-08 DIAGNOSIS — F4322 Adjustment disorder with anxiety: Secondary | ICD-10-CM | POA: Diagnosis not present

## 2020-07-22 ENCOUNTER — Encounter: Payer: Medicaid Other | Admitting: Certified Nurse Midwife

## 2020-07-22 ENCOUNTER — Telehealth: Payer: Self-pay

## 2020-07-22 DIAGNOSIS — F4324 Adjustment disorder with disturbance of conduct: Secondary | ICD-10-CM | POA: Diagnosis not present

## 2020-07-22 NOTE — Telephone Encounter (Signed)
Patient called in stating that she was instructed by her provider to let her know if the patient was having any irregular bleeding while on birth control. Patient states she been on this birth control since march and has been having irregular bleeding ever since starting this birth control. Could you please advise?

## 2020-07-23 ENCOUNTER — Telehealth: Payer: Self-pay

## 2020-07-23 NOTE — Telephone Encounter (Signed)
Cynthia Harrell,   Please let Dreama know that this is a common side effect with the nexplanon. IF she has not already tried it, have her take ibuprofen 600 mg every 6 hrs for 5-7 days. This can help thin the lining of the uterus and help bleeding stop. Other option to help with bleeding is taking bc pill for 3 months in addition to the nexplanon or taking a course of Lysteda. Lysteda is 650 mg tablets taken three times daily for a maximum of 5 days .Marland Kitchen Please let me know what she would like to do. Also advise that some times none of the option work and she may continue to have irregular bleeding if that is the case she may want to try a different method of BC.    Thanks  Deneise Lever

## 2020-07-23 NOTE — Telephone Encounter (Signed)
mychart message sent to patient

## 2020-07-30 DIAGNOSIS — Z03818 Encounter for observation for suspected exposure to other biological agents ruled out: Secondary | ICD-10-CM | POA: Diagnosis not present

## 2020-07-30 DIAGNOSIS — Z20822 Contact with and (suspected) exposure to covid-19: Secondary | ICD-10-CM | POA: Diagnosis not present

## 2020-09-02 DIAGNOSIS — F4323 Adjustment disorder with mixed anxiety and depressed mood: Secondary | ICD-10-CM | POA: Diagnosis not present

## 2020-09-23 DIAGNOSIS — F4323 Adjustment disorder with mixed anxiety and depressed mood: Secondary | ICD-10-CM | POA: Diagnosis not present

## 2020-09-28 ENCOUNTER — Encounter: Payer: Medicaid Other | Admitting: Certified Nurse Midwife

## 2020-10-01 ENCOUNTER — Telehealth: Payer: Self-pay

## 2020-10-01 NOTE — Telephone Encounter (Signed)
Voicemail message left for patient re: no visitors

## 2020-10-05 ENCOUNTER — Encounter: Payer: Medicaid Other | Admitting: Certified Nurse Midwife

## 2020-10-06 ENCOUNTER — Telehealth: Payer: Self-pay

## 2020-10-06 NOTE — Telephone Encounter (Signed)
Pt called in and stated that  Her appointment was canceled because we were closed yesterday due to weather. The pt stated she is having UTI symptoms and its been over a week. The pt was requesting to be see for just the UTI. I told her that I can send a message to the nurse that her provider is booked. The pt verbally understood. I told her that it will be up to the provider if she can see her. Please advise

## 2020-10-06 NOTE — Telephone Encounter (Signed)
mychart message sent to patient- what are her UTI symptoms

## 2020-10-07 DIAGNOSIS — F4323 Adjustment disorder with mixed anxiety and depressed mood: Secondary | ICD-10-CM | POA: Diagnosis not present

## 2020-10-10 ENCOUNTER — Other Ambulatory Visit: Payer: Self-pay | Admitting: Certified Nurse Midwife

## 2020-10-10 DIAGNOSIS — R319 Hematuria, unspecified: Secondary | ICD-10-CM

## 2020-10-10 DIAGNOSIS — N390 Urinary tract infection, site not specified: Secondary | ICD-10-CM

## 2020-10-12 ENCOUNTER — Other Ambulatory Visit (HOSPITAL_COMMUNITY)
Admission: RE | Admit: 2020-10-12 | Discharge: 2020-10-12 | Disposition: A | Discharge status: Home / Self Care | Payer: Medicaid Other | Source: Ambulatory Visit | Attending: Certified Nurse Midwife | Admitting: Certified Nurse Midwife

## 2020-10-12 ENCOUNTER — Encounter: Payer: Self-pay | Admitting: Certified Nurse Midwife

## 2020-10-12 ENCOUNTER — Other Ambulatory Visit: Payer: Self-pay

## 2020-10-12 ENCOUNTER — Ambulatory Visit (INDEPENDENT_AMBULATORY_CARE_PROVIDER_SITE_OTHER): Payer: Medicaid Other | Admitting: Certified Nurse Midwife

## 2020-10-12 VITALS — BP 117/78 | HR 86 | Ht 66.0 in | Wt 173.2 lb

## 2020-10-12 DIAGNOSIS — N898 Other specified noninflammatory disorders of vagina: Secondary | ICD-10-CM

## 2020-10-12 DIAGNOSIS — Z124 Encounter for screening for malignant neoplasm of cervix: Secondary | ICD-10-CM | POA: Insufficient documentation

## 2020-10-12 DIAGNOSIS — Z01419 Encounter for gynecological examination (general) (routine) without abnormal findings: Secondary | ICD-10-CM | POA: Insufficient documentation

## 2020-10-12 MED ORDER — NORETHIN ACE-ETH ESTRAD-FE 1-20 MG-MCG PO TABS
1.0000 | ORAL_TABLET | Freq: Every day | ORAL | 11 refills | Status: DC
Start: 2020-10-12 — End: 2020-11-30

## 2020-10-12 NOTE — Progress Notes (Signed)
GYNECOLOGY ANNUAL PREVENTATIVE CARE ENCOUNTER NOTE  History:     Cynthia Harrell is a 35 y.o. [phone] female here for a routine annual gynecologic exam.  Current complaints: vaginal discharge that she would like to be tested for infection.   Denies abnormal vaginal bleeding, discharge, pelvic pain, problems with intercourse or other gynecologic concerns.     Social Relationship: single Living:with her mom and kids, moving out next week Work: [employer] Exercise: none Smoke/Alcohol/drug use: denies  Gynecologic History No LMP recorded. Patient has had an implant. Contraception: Nexplanon, removed today, wants lo estrin  Last Pap: 09/14/2018 Results were: normal  Last mammogram: n/a   Upstream - 10/12/20 1001      Pregnancy Intention Screening   Does the patient want to become pregnant in the next year? No    Does the patient's partner want to become pregnant in the next year? No    Would the patient like to discuss contraceptive options today? No      Contraception Wrap Up   Current Method Hormonal Implant    End Method Hormonal Implant          The pregnancy intention screening data noted above was reviewed. Potential methods of contraception were discussed. The patient elected to proceed with Oral Contraceptive.    Obstetric History OB History  Gravida Para Term Preterm AB Living  4 3 3   1 3   SAB IAB Ectopic Multiple Live Births    1   0 3    # Outcome Date GA Lbr Len/2nd Weight Sex Delivery Anes PTL Lv  4 Term 08/09/19 [redacted]w[redacted]d  8 lb 13.8 oz (4.02 kg) F Vag-Spont None  LIV  3 Term 11/06/17 [redacted]w[redacted]d / 00:43 9 lb 4.9 oz (4.22 kg) F Vag-Spont EPI  LIV  2 Term 12/04/06 [redacted]w[redacted]d  8 lb 1 oz (3.657 kg) F Vag-Spont EPI N LIV  1 IAB      TAB       Past Medical History:  Diagnosis Date  . Gestational diabetes   . 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 High-risk sexual behavior   . Microalbuminuria     Past Surgical History:  Procedure Laterality Date  .  THERAPEUTIC ABORTION  2008    Current Outpatient Medications on File Prior to Visit  Medication Sig Dispense Refill  . etonogestrel (NEXPLANON) 68 MG IMPL implant 1 each by Subdermal route once.    . metroNIDAZOLE (FLAGYL) 500 MG tablet Take 1 tablet (500 mg total) by mouth 2 (two) times daily. 14 tablet 0  . valACYclovir (VALTREX) 500 MG tablet TAKE 1 TABLET BY MOUTH TWICE A DAY 60 tablet 0   No current facility-administered medications on file prior to visit.    No Known Allergies  Social History:  reports that she has never smoked. She has never used smokeless tobacco. She reports that she does not drink alcohol and does not use drugs.  Family History  Problem Relation Age of Onset  . Diabetes Father   . Hypertension Mother   . Cancer Mother        melanoma  . Stroke Maternal Grandfather   . Stroke Paternal Grandfather   . COPD Neg Hx   . Heart disease Neg Hx     The following portions of the patient's history were reviewed and updated as appropriate: allergies, current medications, past family history, past medical history, past social history, past surgical history and problem list.  Review of Systems Pertinent items  noted in HPI and remainder of comprehensive ROS otherwise negative.  Physical Exam:  There were no vitals taken for this visit. CONSTITUTIONAL: Well-developed, well-nourished female in no acute distress.  HENT:  Normocephalic, atraumatic, External right and left ear normal. Oropharynx is clear and moist EYES: Conjunctivae and EOM are normal. Pupils are equal, round, and reactive to light. No scleral icterus.  NECK: Normal range of motion, supple, no masses.  Normal thyroid.  SKIN: Skin is warm and dry. No rash noted. Not diaphoretic. No erythema. No pallor. MUSCULOSKELETAL: Normal range of motion. No tenderness.  No cyanosis, clubbing, or edema.  2+ distal pulses. NEUROLOGIC: Alert and oriented to person, place, and time. Normal reflexes, muscle tone  coordination.  PSYCHIATRIC: Normal mood and affect. Normal behavior. Normal judgment and thought content. CARDIOVASCULAR: Normal heart rate noted, regular rhythm RESPIRATORY: Clear to auscultation bilaterally. Effort and breath sounds normal, no problems with respiration noted. BREASTS: Symmetric in size. No masses, tenderness, skin changes, nipple drainage, or lymphadenopathy bilaterally.  ABDOMEN: Soft, no distention noted.  No tenderness, rebound or guarding.  PELVIC: Normal appearing external genitalia and urethral meatus; normal appearing vaginal mucosa and cervix.  No abnormal discharge noted.  Pap smear obtained. Contract bleeding.  Normal uterine size, no other palpable masses, no uterine or adnexal tenderness.  .   Assessment and Plan:    1. Women's annual routine gynecological examination    Pap:Will follow up results of pap smear and manage accordingly. Mammogram :n/a  Labs:vaignal swab Refills: lo estrin  Referral: none Routine preventative health maintenance measures emphasized. Please refer to After Visit Summary for other counseling recommendations.      Philip Aspen, CNM [facility]

## 2020-10-12 NOTE — Patient Instructions (Signed)
Nexplanon Instructions After removal   Keep bandage clean and dry for 24 hours   May use ice/Tylenol/Ibuprofen for soreness or pain   If you develop fever, drainage or increased warmth from incision site-contact office immediately   

## 2020-10-12 NOTE — Progress Notes (Signed)
Cynthia Harrell is a 35 y.o. year old [id number] Caucasian female here for [place] removal   She was given informed consent for removal, No LMP recorded. Patient has had an implant..    BP 117/78   Pulse 86   Ht 5\' 6"  (1.676 m)   Wt 173 lb 4 oz (78.6 kg)   BMI 27.96 kg/m  No LMP recorded. Patient has had an implant. No results found for this or any previous visit (from the past 24 hour(s)).   Appropriate time out taken. Nexplanon site identified.  Area prepped in usual sterile fashon. Two cc's of 2% lidocaine was used to anesthetize the area. A small stab incision was made right beside the implant on the distal portion.  The Nexplanon rod was grasped using hemostats and removed intact without difficulty.   Steri-strips and a pressure bandage was applied.  There was less than 3 cc blood loss. There were no complications.  The patient tolerated the procedure well.  She was instructed to keep the area clean and dry, remove pressure bandage in 24 hours, and keep insertion site covered with the steri-strips for 3-5 days.  She will use the pill for [facility].She denies any contraindications to use.    Follow-up PRN problems.  Philip Aspen , CNM

## 2020-10-14 ENCOUNTER — Other Ambulatory Visit: Payer: Self-pay | Admitting: Certified Nurse Midwife

## 2020-10-14 LAB — CERVICOVAGINAL ANCILLARY ONLY
Bacterial Vaginitis (gardnerella): POSITIVE — AB
Candida Glabrata: NEGATIVE
Candida Vaginitis: NEGATIVE
Chlamydia: NEGATIVE
Comment: NEGATIVE
Comment: NEGATIVE
Comment: NEGATIVE
Comment: NEGATIVE
Comment: NEGATIVE
Comment: NORMAL
Neisseria Gonorrhea: NEGATIVE
Trichomonas: NEGATIVE

## 2020-10-14 MED ORDER — METRONIDAZOLE 500 MG PO TABS: 500.0000 mg | ORAL_TABLET | Freq: Two times a day (BID) | ORAL | 0 refills | Status: AC

## 2020-10-14 NOTE — Progress Notes (Signed)
Swab positive for BV, orders placed for treatment.   Aniah Pauli, CNM  

## 2020-10-16 LAB — CYTOLOGY - PAP
Comment: NEGATIVE
Diagnosis: UNDETERMINED — AB
High risk HPV: NEGATIVE

## 2020-10-19 ENCOUNTER — Encounter: Payer: Medicaid Other | Admitting: Certified Nurse Midwife

## 2020-10-21 DIAGNOSIS — F4323 Adjustment disorder with mixed anxiety and depressed mood: Secondary | ICD-10-CM | POA: Diagnosis not present

## 2020-11-04 DIAGNOSIS — F4323 Adjustment disorder with mixed anxiety and depressed mood: Secondary | ICD-10-CM | POA: Diagnosis not present

## 2020-11-13 ENCOUNTER — Other Ambulatory Visit: Payer: Self-pay | Admitting: Certified Nurse Midwife

## 2020-11-13 DIAGNOSIS — N390 Urinary tract infection, site not specified: Secondary | ICD-10-CM

## 2020-11-13 DIAGNOSIS — R319 Hematuria, unspecified: Secondary | ICD-10-CM

## 2020-11-13 NOTE — Telephone Encounter (Signed)
Please advise. Thanks Azaiah Licciardi 

## 2020-11-13 NOTE — Telephone Encounter (Signed)
Please advise. Thanks Ozan Maclay 

## 2020-11-13 NOTE — Telephone Encounter (Signed)
Please advise. Thanks Drea Jurewicz 

## 2020-11-13 NOTE — Telephone Encounter (Signed)
Please forward. Thanks, JML

## 2020-11-18 DIAGNOSIS — F4323 Adjustment disorder with mixed anxiety and depressed mood: Secondary | ICD-10-CM | POA: Diagnosis not present

## 2020-11-26 DIAGNOSIS — F4323 Adjustment disorder with mixed anxiety and depressed mood: Secondary | ICD-10-CM | POA: Diagnosis not present

## 2020-11-30 ENCOUNTER — Ambulatory Visit (INDEPENDENT_AMBULATORY_CARE_PROVIDER_SITE_OTHER): Payer: Medicaid Other | Admitting: Certified Nurse Midwife

## 2020-11-30 ENCOUNTER — Encounter: Payer: Self-pay | Admitting: Certified Nurse Midwife

## 2020-11-30 ENCOUNTER — Other Ambulatory Visit: Payer: Self-pay

## 2020-11-30 ENCOUNTER — Other Ambulatory Visit (HOSPITAL_COMMUNITY)
Admission: RE | Admit: 2020-11-30 | Discharge: 2020-11-30 | Disposition: A | Discharge status: Home / Self Care | Payer: Medicaid Other | Source: Ambulatory Visit | Attending: Certified Nurse Midwife | Admitting: Certified Nurse Midwife

## 2020-11-30 VITALS — BP 122/79 | HR 93 | Ht 66.0 in | Wt 175.0 lb

## 2020-11-30 DIAGNOSIS — R350 Frequency of micturition: Secondary | ICD-10-CM | POA: Diagnosis not present

## 2020-11-30 DIAGNOSIS — N898 Other specified noninflammatory disorders of vagina: Secondary | ICD-10-CM

## 2020-11-30 LAB — POCT URINALYSIS DIPSTICK
Bilirubin, UA: NEGATIVE
Blood, UA: NEGATIVE
Glucose, UA: NEGATIVE
Ketones, UA: NEGATIVE
Leukocytes, UA: NEGATIVE
Nitrite, UA: NEGATIVE
Protein, UA: NEGATIVE
Spec Grav, UA: 1.015 (ref 1.010–1.025)
Urobilinogen, UA: 0.2 E.U./dL
pH, UA: 6 (ref 5.0–8.0)

## 2020-11-30 MED ORDER — NORELGESTROMIN-ETH ESTRADIOL 150-35 MCG/24HR TD PTWK: 1.0000 | MEDICATED_PATCH | TRANSDERMAL | 12 refills | Status: DC

## 2020-11-30 NOTE — Progress Notes (Signed)
GYN ENCOUNTER NOTE  Subjective:       Cynthia Harrell is a 35 y.o. [phone] female is here for gynecologic evaluation of the following issues:  1. Urinary frequency 2.Vaginal discharge with mild itching  3. Change in [facility]    Gynecologic History Patient's last menstrual period was 11/24/2020 (approximate). Contraception: OCP (estrogen/progesterone) Last Pap: 10/12/2020. Results were: ASCUS , HPV risk  Last mammogram: n/a  Obstetric History OB History  Gravida Para Term Preterm AB Living  4 3 3   1 3   SAB IAB Ectopic Multiple Live Births    1   0 3    # Outcome Date GA Lbr Len/2nd Weight Sex Delivery Anes PTL Lv  4 Term 08/09/19 [redacted]w[redacted]d  8 lb 13.8 oz (4.02 kg) F Vag-Spont None  LIV  3 Term 11/06/17 [redacted]w[redacted]d / 00:43 9 lb 4.9 oz (4.22 kg) F Vag-Spont EPI  LIV  2 Term 12/04/06 [redacted]w[redacted]d  8 lb 1 oz (3.657 kg) F Vag-Spont EPI N LIV  1 IAB      TAB       Past Medical History:  Diagnosis Date  . Gestational diabetes   . 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 High-risk sexual behavior   . Microalbuminuria     Past Surgical History:  Procedure Laterality Date  . THERAPEUTIC ABORTION  2008    Current Outpatient Medications on File Prior to Visit  Medication Sig Dispense Refill  . valACYclovir (VALTREX) 500 MG tablet TAKE 1 TABLET BY MOUTH TWICE A DAY 60 tablet 0  . norethindrone-ethinyl estradiol (LOESTRIN FE) 1-20 MG-MCG tablet Take 1 tablet by mouth daily. (Patient not taking: Reported on 11/30/2020) 30 tablet 11   No current facility-administered medications on file prior to visit.    No Known Allergies  Social History   Socioeconomic History  . Marital status: Significant Other    Spouse name: Doren Custard   . Number of children: Not on file  . Years of education: Not on file  . Highest education level: Not on file  Occupational History  . Not on file  Tobacco Use  . Smoking status: Never Smoker  . Smokeless tobacco: Never Used  Vaping Use  . Vaping Use: Never used  Substance  and Sexual Activity  . Alcohol use: No    Alcohol/week: 0.0 standard drinks  . Drug use: No  . Sexual activity: Not Currently    Birth control/protection: Implant    Comment: undecided  Other Topics Concern  . Not on file  Social History Narrative  . Not on file   Social Determinants of Health   Financial Resource Strain: Not on file  Food Insecurity: Not on file  Transportation Needs: Not on file  Physical Activity: Not on file  Stress: Not on file  Social Connections: Not on file  Intimate Partner Violence: Not on file    Family History  Problem Relation Age of Onset  . Diabetes Father   . Hypertension Mother   . Cancer Mother        melanoma  . Stroke Maternal Grandfather   . Stroke Paternal Grandfather   . COPD Neg Hx   . Heart disease Neg Hx     The following portions of the patient's history were reviewed and updated as appropriate: allergies, current medications, past family history, past medical history, past social history, past surgical history and problem list.  Review of Systems Review of Systems - Negative except as mentioned in HPI Review of Systems -  General ROS: negative for - chills, fatigue, fever, hot flashes, malaise or night sweats Hematological and Lymphatic ROS: negative for - bleeding problems or swollen lymph nodes Gastrointestinal ROS: negative for - abdominal pain, blood in stools, change in bowel habits and nausea/vomiting Musculoskeletal ROS: negative for - joint pain, muscle pain or muscular weakness Genito-Urinary ROS: negative for - change in menstrual cycle, dysmenorrhea, dyspareunia, dysuria,, genital ulcers, hematuria, incontinence, irregular/heavy menses, nocturia or pelvic pain. Positive for mild itching,vaginal discharge, urinary frequency.   Objective:   BP 122/79   Pulse 93   Ht 5\' 6"  (1.676 m)   Wt 175 lb (79.4 kg)   LMP 11/24/2020 (Approximate)   BMI 28.25 kg/m  CONSTITUTIONAL: Well-developed, well-nourished female in no  acute distress.  HENT:  Normocephalic, atraumatic.  NECK: Normal range of motion, supple, no masses.  Normal thyroid.  SKIN: Skin is warm and dry. No rash noted. Not diaphoretic. No erythema. No pallor. [place]: Alert and oriented to person, place, and time. PSYCHIATRIC: Normal mood and affect. Normal behavior. Normal judgment and thought content. CARDIOVASCULAR:Not Examined RESPIRATORY: Not Examined BREASTS: Not Examined ABDOMEN: Soft, non distended; Non tender.  No Organomegaly. PELVIC:  External Genitalia: Normal  BUS: Normal  Vagina: Normal, clear -white discharge, no odor  Cervix: Normal MUSCULOSKELETAL: Normal range of motion. No tenderness.  No cyanosis, clubbing, or edema.     Assessment:   1. Frequent urination - POCT urinalysis dipstick   2. Vaginal discharge  3.Change in Birth control   Plan:   Urine culture completed, Vaginal swab collected-encouraged use of boric acid vaginal suppositories. Discussed BC options, pt interested in patch. She agrees . Orders placed for patch with instructions on use.   Philip Aspen, CNM

## 2020-11-30 NOTE — Patient Instructions (Signed)
Vaginal Yeast Infection, Adult  Vaginal yeast infection is a condition that causes vaginal discharge as well as soreness, swelling, and redness (inflammation) of the vagina. This is a common condition. Some women get this infection frequently. What are the causes? This condition is caused by a change in the normal balance of the yeast (candida) and bacteria that live in the vagina. This change causes an overgrowth of yeast, which causes the inflammation. What increases the risk? The condition is more likely to develop in women who:  Take antibiotic medicines.  Have diabetes.  Take birth control pills.  Are pregnant.  Douche often.  Have a weak body defense system (immune system).  Have been taking steroid medicines for a long time.  Frequently wear tight clothing. What are the signs or symptoms? Symptoms of this condition include:  White, thick, creamy vaginal discharge.  Swelling, itching, redness, and irritation of the vagina. The lips of the vagina (vulva) may be affected as well.  Pain or a burning feeling while urinating.  Pain during sex. How is this diagnosed? This condition is diagnosed based on:  Your medical history.  A physical exam.  A pelvic exam. Your health care provider will examine a sample of your vaginal discharge under a microscope. Your health care provider may send this sample for testing to confirm the diagnosis. How is this treated? This condition is treated with medicine. Medicines may be over-the-counter or prescription. You may be told to use one or more of the following:  Medicine that is taken by mouth (orally).  Medicine that is applied as a cream (topically).  Medicine that is inserted directly into the vagina (suppository). Follow these instructions at home: Lifestyle  Do not have sex until your health care provider approves. Tell your sex partner that you have a yeast infection. That person should go to his or her health care  provider and ask if they should also be treated.  Do not wear tight clothes, such as pantyhose or tight pants.  Wear breathable cotton underwear. General instructions  Take or apply over-the-counter and prescription medicines only as told by your health care provider.  Eat more yogurt. This may help to keep your yeast infection from returning.  Do not use tampons until your health care provider approves.  Try taking a sitz bath to help with discomfort. This is a warm water bath that is taken while you are sitting down. The water should only come up to your hips and should cover your buttocks. Do this 3-4 times per day or as told by your health care provider.  Do not douche.  If you have diabetes, keep your blood sugar levels under control.  Keep all follow-up visits as told by your health care provider. This is important.   Contact a health care provider if:  You have a fever.  Your symptoms go away and then return.  Your symptoms do not get better with treatment.  Your symptoms get worse.  You have new symptoms.  You develop blisters in or around your vagina.  You have blood coming from your vagina and it is not your menstrual period.  You develop pain in your abdomen. Summary  Vaginal yeast infection is a condition that causes discharge as well as soreness, swelling, and redness (inflammation) of the vagina.  This condition is treated with medicine. Medicines may be over-the-counter or prescription.  Take or apply over-the-counter and prescription medicines only as told by your health care provider.  Do not   douche. Do not have sex or use tampons until your health care provider approves.  Contact a health care provider if your symptoms do not get better with treatment or your symptoms go away and then return. This information is not intended to replace advice given to you by your health care provider. Make sure you discuss any questions you have with your health care  provider. Document Revised: 04/05/2019 Document Reviewed: 01/22/2018 Elsevier Patient Education  2021 [employer]  

## 2020-12-01 ENCOUNTER — Other Ambulatory Visit: Payer: Self-pay | Admitting: Certified Nurse Midwife

## 2020-12-01 LAB — CERVICOVAGINAL ANCILLARY ONLY
Bacterial Vaginitis (gardnerella): POSITIVE — AB
Candida Glabrata: NEGATIVE
Candida Vaginitis: NEGATIVE
Comment: NEGATIVE
Comment: NEGATIVE
Comment: NEGATIVE

## 2020-12-01 MED ORDER — FLUCONAZOLE 150 MG PO TABS: 150.0000 mg | ORAL_TABLET | Freq: Once | ORAL | 0 refills | Status: AC

## 2020-12-01 MED ORDER — METRONIDAZOLE 500 MG PO TABS: 500.0000 mg | ORAL_TABLET | Freq: Two times a day (BID) | ORAL | 0 refills | Status: AC

## 2020-12-02 DIAGNOSIS — J039 Acute tonsillitis, unspecified: Secondary | ICD-10-CM | POA: Diagnosis not present

## 2020-12-02 DIAGNOSIS — J028 Acute pharyngitis due to other specified organisms: Secondary | ICD-10-CM | POA: Diagnosis not present

## 2020-12-07 ENCOUNTER — Other Ambulatory Visit: Payer: Self-pay | Admitting: Certified Nurse Midwife

## 2020-12-07 MED ORDER — FLUCONAZOLE 150 MG PO TABS: 150.0000 mg | ORAL_TABLET | Freq: Once | ORAL | 0 refills | Status: AC

## 2020-12-08 ENCOUNTER — Ambulatory Visit: Payer: Self-pay | Admitting: Podiatry

## 2020-12-08 DIAGNOSIS — M7751 Other enthesopathy of right foot: Secondary | ICD-10-CM | POA: Diagnosis not present

## 2020-12-08 DIAGNOSIS — M7989 Other specified soft tissue disorders: Secondary | ICD-10-CM | POA: Diagnosis not present

## 2020-12-23 DIAGNOSIS — M722 Plantar fascial fibromatosis: Secondary | ICD-10-CM | POA: Diagnosis not present

## 2020-12-24 ENCOUNTER — Other Ambulatory Visit: Payer: Self-pay | Admitting: Certified Nurse Midwife

## 2020-12-24 DIAGNOSIS — N390 Urinary tract infection, site not specified: Secondary | ICD-10-CM

## 2020-12-25 NOTE — Telephone Encounter (Signed)
Please advise.  Cynthia Harrell

## 2020-12-30 DIAGNOSIS — F4323 Adjustment disorder with mixed anxiety and depressed mood: Secondary | ICD-10-CM | POA: Diagnosis not present

## 2021-01-04 DIAGNOSIS — J039 Acute tonsillitis, unspecified: Secondary | ICD-10-CM | POA: Diagnosis not present

## 2021-01-04 DIAGNOSIS — J029 Acute pharyngitis, unspecified: Secondary | ICD-10-CM | POA: Diagnosis not present

## 2021-01-04 DIAGNOSIS — Z03818 Encounter for observation for suspected exposure to other biological agents ruled out: Secondary | ICD-10-CM | POA: Diagnosis not present

## 2021-01-04 DIAGNOSIS — J028 Acute pharyngitis due to other specified organisms: Secondary | ICD-10-CM | POA: Diagnosis not present

## 2021-01-06 DIAGNOSIS — H9203 Otalgia, bilateral: Secondary | ICD-10-CM | POA: Diagnosis not present

## 2021-01-06 DIAGNOSIS — J029 Acute pharyngitis, unspecified: Secondary | ICD-10-CM | POA: Diagnosis not present

## 2021-01-06 DIAGNOSIS — R590 Localized enlarged lymph nodes: Secondary | ICD-10-CM | POA: Diagnosis not present

## 2021-01-06 DIAGNOSIS — H9209 Otalgia, unspecified ear: Secondary | ICD-10-CM | POA: Diagnosis not present

## 2021-01-06 DIAGNOSIS — R220 Localized swelling, mass and lump, head: Secondary | ICD-10-CM | POA: Diagnosis not present

## 2021-01-06 DIAGNOSIS — R059 Cough, unspecified: Secondary | ICD-10-CM | POA: Diagnosis not present

## 2021-01-13 DIAGNOSIS — F4323 Adjustment disorder with mixed anxiety and depressed mood: Secondary | ICD-10-CM | POA: Diagnosis not present

## 2021-01-18 ENCOUNTER — Other Ambulatory Visit: Payer: Self-pay | Admitting: Certified Nurse Midwife

## 2021-01-18 MED ORDER — FLUCONAZOLE 150 MG PO TABS: 150.0000 mg | ORAL_TABLET | Freq: Once | ORAL | 1 refills | Status: AC

## 2021-01-24 ENCOUNTER — Other Ambulatory Visit: Payer: Self-pay | Admitting: Certified Nurse Midwife

## 2021-01-24 DIAGNOSIS — R319 Hematuria, unspecified: Secondary | ICD-10-CM

## 2021-01-24 DIAGNOSIS — N390 Urinary tract infection, site not specified: Secondary | ICD-10-CM

## 2021-01-26 DIAGNOSIS — F4323 Adjustment disorder with mixed anxiety and depressed mood: Secondary | ICD-10-CM | POA: Diagnosis not present

## 2021-01-26 NOTE — Telephone Encounter (Signed)
Please advise on refill.

## 2021-02-01 DIAGNOSIS — Z03818 Encounter for observation for suspected exposure to other biological agents ruled out: Secondary | ICD-10-CM | POA: Diagnosis not present

## 2021-02-01 DIAGNOSIS — Z20822 Contact with and (suspected) exposure to covid-19: Secondary | ICD-10-CM | POA: Diagnosis not present

## 2021-02-10 DIAGNOSIS — F4323 Adjustment disorder with mixed anxiety and depressed mood: Secondary | ICD-10-CM | POA: Diagnosis not present

## 2021-02-11 ENCOUNTER — Other Ambulatory Visit: Payer: Self-pay | Admitting: Certified Nurse Midwife

## 2021-02-11 ENCOUNTER — Other Ambulatory Visit: Payer: Self-pay

## 2021-02-11 DIAGNOSIS — R319 Hematuria, unspecified: Secondary | ICD-10-CM

## 2021-02-11 DIAGNOSIS — N390 Urinary tract infection, site not specified: Secondary | ICD-10-CM

## 2021-02-24 DIAGNOSIS — F4323 Adjustment disorder with mixed anxiety and depressed mood: Secondary | ICD-10-CM | POA: Diagnosis not present

## 2021-03-04 ENCOUNTER — Other Ambulatory Visit (HOSPITAL_COMMUNITY)
Admission: RE | Admit: 2021-03-04 | Discharge: 2021-03-04 | Disposition: A | Discharge status: Home / Self Care | Payer: Medicaid Other | Source: Ambulatory Visit | Attending: Certified Nurse Midwife | Admitting: Certified Nurse Midwife

## 2021-03-04 ENCOUNTER — Encounter: Payer: Self-pay | Admitting: Certified Nurse Midwife

## 2021-03-04 ENCOUNTER — Other Ambulatory Visit: Payer: Self-pay

## 2021-03-04 ENCOUNTER — Ambulatory Visit (INDEPENDENT_AMBULATORY_CARE_PROVIDER_SITE_OTHER): Payer: Medicaid Other | Admitting: Certified Nurse Midwife

## 2021-03-04 VITALS — BP 121/82 | HR 84 | Resp 16 | Ht 66.0 in | Wt 169.1 lb

## 2021-03-04 DIAGNOSIS — N898 Other specified noninflammatory disorders of vagina: Secondary | ICD-10-CM | POA: Insufficient documentation

## 2021-03-04 DIAGNOSIS — Z8742 Personal history of other diseases of the female genital tract: Secondary | ICD-10-CM | POA: Insufficient documentation

## 2021-03-04 NOTE — Patient Instructions (Signed)
Boric Acid vaginal suppository What is this medication? BORIC ACID (BOHR ik AS id) helps to promote the proper acid balance in the vagina. It is used to help treat yeast infections of the vagina and relievesymptoms such as itching and burning. This medicine may be used for other purposes; ask your health care provider orpharmacist if you have questions. COMMON BRAND NAME(S): AZO Boric Acid with Aloe Vera, Hylafem What should I tell my care team before I take this medication? They need to know if you have any of these conditions: diabetes frequent infections HIV or AIDS immune system problems an unusual or allergic reaction to boric acid, other medicines, foods, dyes, or preservatives pregnant or trying to get pregnant breast-feeding How should I use this medication? This medicine is for use in the vagina. Do not take by mouth. Follow the directions on the prescription label. Read package directions carefully before using. Wash hands before and after use. Use this medicine at bedtime, unless otherwise directed by your doctor. Do not use your medicine more often thandirected. Do not stop using this medicine except on your doctor's advice. Talk to your pediatrician regarding the use of this medicine in children. Thismedicine is not approved for use in children. Overdosage: If you think you have taken too much of this medicine contact apoison control center or emergency room at once. NOTE: This medicine is only for you. Do not share this medicine with others. What if I miss a dose? If you miss a dose, use it as soon as you can. If it is almost time for yournext dose, use only that dose. Do not use double or extra doses. What may interact with this medication? Interactions are not expected. Do not use any other vaginal products withouttelling your doctor or health care professional. This list may not describe all possible interactions. Give your health care provider a list of all the medicines,  herbs, non-prescription drugs, or dietary supplements you use. Also tell them if you smoke, drink alcohol, or use illegaldrugs. Some items may interact with your medicine. What should I watch for while using this medication? Tell your doctor or health care professional if your symptoms do not start toget better within a few days. It is better not to have sex until you have finished your treatment. This medicine may damage condoms or diaphragms and cause them not to work properly. It may also decrease the effect of vaginal spermicides. Do not rely on any of these methods to prevent sexually transmitted diseases or pregnancy while youare using this medicine. Vaginal medicines usually will come out of the vagina during treatment. To keep the medicine from getting on your clothing, wear a panty liner. The use of tampons is not recommended. To help clear up the infection, wear freshly washedcotton, not synthetic, underwear. What side effects may I notice from receiving this medication? Side effects that you should report to your doctor or health care professionalas soon as possible: allergic reactions like skin rash, itching or hives vaginal irritation, redness, or burning Side effects that usually do not require medical attention (report to yourdoctor or health care professional if they continue or are bothersome): vaginal discharge This list may not describe all possible side effects. Call your doctor for medical advice about side effects. You may report side effects to FDA at1-800-FDA-1088. Where should I keep my medication? Keep out of the reach of children. Store in a cool, dry place between 15 and 30 degrees C (59 and 86 degrees F). Keep  away from sunlight. Throw away any unused medicine after the expirationdate. NOTE: This sheet is a summary. It may not cover all possible information. If you have questions about this medicine, talk to your doctor, pharmacist, orhealth care provider.  2022  Elsevier/Gold Standard (2015-10-08 07:29:58)     Vaginitis  Vaginitis is irritation and swelling of the vagina. Treatment will depend onthe cause. What are the causes? It can be caused by: Bacteria. Yeast. A parasite. A virus. Low hormone levels. Bubble baths, scented tampons, and feminine sprays. Other things can change the balance of the yeast and bacteria that live in the vagina. These include: Antibiotic medicines. Not being clean enough. Some birth control methods. Sex. Infection. Diabetes. A weakened body defense system (immune system). What increases the risk? Smoking or being around someone who smokes. Using washes (douches), scented tampons, or scented pads. Wearing tight pants or thong underwear. Using birth control pills or an IUD. Having sex without a condom or having a lot of partners. Having an STI. Using a certain product to kill sperm (nonoxynol-9). Eating foods that are high in sugar. Having diabetes. Having low levels of a female hormone. Having a weakened body defense system. Being pregnant or breastfeeding. What are the signs or symptoms? Fluid coming from the vagina that is not normal. A bad smell. Itching, pain, or swelling. Pain with sex. Pain or burning when you pee (urinate). Sometimes there are no symptoms. How is this treated? Treatment may include: Antibiotic creams or pills. Antifungal medicines. Medicines to ease symptoms if you have a virus. Your sex partner should also be treated. Estrogen medicines. Avoiding scented soaps, sprays, or douches. Stopping use of products that caused irritation and then using a cream to treat symptoms. Follow these instructions at home: Lifestyle Keep the area around your vagina clean and dry. Avoid using soap. Rinse the area with water. Until your doctor says it is okay: Do not use washes for the vagina. Do not use tampons. Do not have sex. Wipe from front to back after going to the  bathroom. When your doctor says it is okay, practice safe sex and use condoms. General instructions Take over-the-counter and prescription medicines only as told by your doctor. If you were prescribed an antibiotic medicine, take or use it as told by your doctor. Do not stop taking or using it even if you start to feel better. Keep all follow-up visits. How is this prevented? Do not use things that can irritate the vagina, such as fabric softeners. Avoid these products if they are scented: Sprays. Detergents. Tampons. Products for cleaning the vagina. Soaps or bubble baths. Let air reach your vagina. To do this: Wear cotton underwear. Do not wear: Underwear while you sleep. Tight pants. Thong underwear. Underwear or nylons without a cotton panel. Take off any wet clothing, such as bathing suits, as soon as you can. Practice safe sex and use condoms. Contact a doctor if: You have pain in your belly or in the area between your hips. You have a fever or chills. Your symptoms last for more than 2-3 days. Get help right away if: You have a fever and your symptoms get worse all of a sudden. Summary Vaginitis is irritation and swelling of the vagina. Treatment will depend on the cause of the condition. Do not use washes or tampons or have sex until your doctor says it is okay. This information is not intended to replace advice given to you by your health care provider. Make sure you  discuss any questions you have with your healthcare provider. Document Revised: 03/05/2020 Document Reviewed: 03/05/2020 Elsevier Patient Education  [place].

## 2021-03-04 NOTE — Progress Notes (Signed)
GYN ENCOUNTER NOTE  Subjective:       Cynthia Harrell is a 35 y.o. [phone] female is here for gynecologic evaluation of the following issues:  Vaginal discharge and itching after intercourse, used condom; history significant for vaginosis  Denies difficulty breathing or respiratory distress, chest pain, abdominal pain, dysuria, and leg pain or swelling.    Gynecologic History  Patient's last menstrual period was 02/17/2021 (exact date). Period Cycle (Days): 30 Period Duration (Days): 8 Period Pattern: Regular Menstrual Flow: Moderate Menstrual Control: Maxi pad Menstrual Control Change Freq (Hours): 1-2 Dysmenorrhea: None  Contraception: condoms and Ortho-Evra patches weekly  Last Pap: 09/2020. Results were: abnormal, ASC-US, HPV negative  Obstetric History  OB History  Gravida Para Term Preterm AB Living  4 3 3   1 3   SAB IAB Ectopic Multiple Live Births    1   0 3    # Outcome Date GA Lbr Len/2nd Weight Sex Delivery Anes PTL Lv  4 Term 08/09/19 [redacted]w[redacted]d  8 lb 13.8 oz (4.02 kg) F Vag-Spont None  LIV  3 Term 11/06/17 [redacted]w[redacted]d / 00:43 9 lb 4.9 oz (4.22 kg) F Vag-Spont EPI  LIV  2 Term 12/04/06 [redacted]w[redacted]d  8 lb 1 oz (3.657 kg) F Vag-Spont EPI N LIV  1 IAB      TAB       Past Medical History:  Diagnosis Date   Gestational diabetes    Herpes simplex without complication    Microalbuminuria     Past Surgical History:  Procedure Laterality Date   THERAPEUTIC ABORTION  2008    Current Outpatient Medications on File Prior to Visit  Medication Sig Dispense Refill   norelgestromin-ethinyl estradiol (ORTHO EVRA) 150-35 MCG/24HR transdermal patch Place 1 patch onto the skin once a week. 3 patch 12   valACYclovir (VALTREX) 500 MG tablet TAKE 1 TABLET BY MOUTH TWICE A DAY 180 tablet 1   No current facility-administered medications on file prior to visit.    No Known Allergies  Social History   Socioeconomic History   Marital status: Significant Other    Spouse name: Doren Custard     Number of children: Not on file   Years of education: Not on file   Highest education level: Not on file  Occupational History   Not on file  Tobacco Use   Smoking status: Never   Smokeless tobacco: Never  Vaping Use   Vaping Use: Never used  Substance and Sexual Activity   Alcohol use: No    Alcohol/week: 0.0 standard drinks   Drug use: No   Sexual activity: Not Currently    Birth control/protection: Implant    Comment: undecided  Other Topics Concern   Not on file  Social History Narrative   Not on file   Social Determinants of Health   Financial Resource Strain: Not on file  Food Insecurity: Not on file  Transportation Needs: Not on file  Physical Activity: Not on file  Stress: Not on file  Social Connections: Not on file  Intimate Partner Violence: Not on file    Family History  Problem Relation Age of Onset   Diabetes Father    Hypertension Mother    Cancer Mother        melanoma   Stroke Maternal Grandfather    Stroke Paternal Grandfather    COPD Neg Hx    Heart disease Neg Hx     The following portions of the patient's history were reviewed and updated as  appropriate: allergies, current medications, past family history, past medical history, past social history, past surgical history and problem list.  Review of Systems  ROS negative except as noted above. Information obtained from patient.   Objective:   BP 121/82   Pulse 84   Resp 16   Ht 5\' 6"  (1.676 m)   Wt 169 lb 1.6 oz (76.7 kg)   LMP 02/17/2021 (Exact Date)   BMI 27.29 kg/m   CONSTITUTIONAL: Well-developed, well-nourished female in no acute distress.   PELVIC:  External Genitalia: Normal  Vagina: Normal, except white discharge present  Cervix: Normal  MUSCULOSKELETAL: Normal range of motion. No tenderness.  No cyanosis, clubbing, or edema.   Assessment:   1. History of vaginitis  - Cervicovaginal ancillary only  2. Vaginal discharge  - Cervicovaginal ancillary only  3.  Vagina itching  - Cervicovaginal ancillary only     Plan:   Vaginal swab collected, see orders.   Samples of Boric Acid provided.   Discussed placement option for birth control patch.   Reviewed red flag symptoms and when to call.   RTC as needed.    Dani Gobble, CNM [facility], [facility] 03/04/21 9:37 AM

## 2021-03-05 LAB — CERVICOVAGINAL ANCILLARY ONLY
Bacterial Vaginitis (gardnerella): NEGATIVE
Candida Glabrata: NEGATIVE
Candida Vaginitis: NEGATIVE
Comment: NEGATIVE
Comment: NEGATIVE
Comment: NEGATIVE

## 2021-03-09 DIAGNOSIS — J039 Acute tonsillitis, unspecified: Secondary | ICD-10-CM | POA: Diagnosis not present

## 2021-03-09 DIAGNOSIS — J029 Acute pharyngitis, unspecified: Secondary | ICD-10-CM | POA: Diagnosis not present

## 2021-03-09 DIAGNOSIS — J028 Acute pharyngitis due to other specified organisms: Secondary | ICD-10-CM | POA: Diagnosis not present

## 2021-03-09 DIAGNOSIS — Z20822 Contact with and (suspected) exposure to covid-19: Secondary | ICD-10-CM | POA: Diagnosis not present

## 2021-03-09 DIAGNOSIS — Z03818 Encounter for observation for suspected exposure to other biological agents ruled out: Secondary | ICD-10-CM | POA: Diagnosis not present

## 2021-03-09 DIAGNOSIS — B373 Candidiasis of vulva and vagina: Secondary | ICD-10-CM | POA: Diagnosis not present

## 2021-03-12 DIAGNOSIS — F4323 Adjustment disorder with mixed anxiety and depressed mood: Secondary | ICD-10-CM | POA: Diagnosis not present

## 2021-03-15 DIAGNOSIS — F4323 Adjustment disorder with mixed anxiety and depressed mood: Secondary | ICD-10-CM | POA: Diagnosis not present

## 2021-03-24 DIAGNOSIS — F4323 Adjustment disorder with mixed anxiety and depressed mood: Secondary | ICD-10-CM | POA: Diagnosis not present

## 2021-04-02 DIAGNOSIS — B373 Candidiasis of vulva and vagina: Secondary | ICD-10-CM | POA: Diagnosis not present

## 2021-04-02 DIAGNOSIS — J029 Acute pharyngitis, unspecified: Secondary | ICD-10-CM | POA: Diagnosis not present

## 2021-04-02 DIAGNOSIS — J039 Acute tonsillitis, unspecified: Secondary | ICD-10-CM | POA: Diagnosis not present

## 2021-04-02 DIAGNOSIS — J028 Acute pharyngitis due to other specified organisms: Secondary | ICD-10-CM | POA: Diagnosis not present

## 2021-04-07 DIAGNOSIS — F4323 Adjustment disorder with mixed anxiety and depressed mood: Secondary | ICD-10-CM | POA: Diagnosis not present

## 2021-04-28 DIAGNOSIS — F4323 Adjustment disorder with mixed anxiety and depressed mood: Secondary | ICD-10-CM | POA: Diagnosis not present

## 2021-05-03 ENCOUNTER — Other Ambulatory Visit (HOSPITAL_COMMUNITY)
Admission: RE | Admit: 2021-05-03 | Discharge: 2021-05-03 | Disposition: A | Discharge status: Home / Self Care | Payer: Medicaid Other | Source: Ambulatory Visit | Attending: Certified Nurse Midwife | Admitting: Certified Nurse Midwife

## 2021-05-03 ENCOUNTER — Ambulatory Visit: Payer: Medicaid Other | Admitting: Certified Nurse Midwife

## 2021-05-03 ENCOUNTER — Encounter: Payer: Self-pay | Admitting: Certified Nurse Midwife

## 2021-05-03 ENCOUNTER — Other Ambulatory Visit: Payer: Self-pay

## 2021-05-03 VITALS — BP 143/80 | HR 81 | Ht 66.0 in | Wt 154.8 lb

## 2021-05-03 DIAGNOSIS — Z113 Encounter for screening for infections with a predominantly sexual mode of transmission: Secondary | ICD-10-CM | POA: Diagnosis not present

## 2021-05-03 NOTE — Progress Notes (Signed)
GYN ENCOUNTER NOTE  Subjective:       Cynthia Harrell is a 35 y.o. [phone] female is here for gynecologic evaluation of the following issues:  1. Has a new partner and is requesting STD screening.  2 increased vaginal discharge with mild itching, hx of BV and yeast infection.      Gynecologic History No LMP recorded. Contraception: Ortho-Evra patches weekly Last Pap: 10/12/2020. Results were: Ascus/Neg HPV Last mammogram: n/a.   Obstetric History OB History  Gravida Para Term Preterm AB Living  '4 3 3   1 3  '$ SAB IAB Ectopic Multiple Live Births    1   0 3    # Outcome Date GA Lbr Len/2nd Weight Sex Delivery Anes PTL Lv  4 Term 08/09/19 [redacted]w[redacted]d 8 lb 13.8 oz (4.02 kg) F Vag-Spont None  LIV  3 Term 11/06/17 436w4d 00:43 9 lb 4.9 oz (4.22 kg) F Vag-Spont EPI  LIV  2 Term 12/04/06 4215w0d lb 1 oz (3.657 kg) F Vag-Spont EPI N LIV  1 IAB      TAB       Past Medical History:  Diagnosis Date   Gestational diabetes    Herpes simplex without complication    High-risk sexual behavior    Microalbuminuria     Past Surgical History:  Procedure Laterality Date   THERAPEUTIC ABORTION  2008    Current Outpatient Medications on File Prior to Visit  Medication Sig Dispense Refill   norelgestromin-ethinyl estradiol (ORTHO EVRA) 150-35 MCG/24HR transdermal patch Place 1 patch onto the skin once a week. 3 patch 12   valACYclovir (VALTREX) 500 MG tablet TAKE 1 TABLET BY MOUTH TWICE A DAY 180 tablet 1   No current facility-administered medications on file prior to visit.    No Known Allergies  Social History   Socioeconomic History   Marital status: Significant Other    Spouse name: PhiDoren Custard Number of children: Not on file   Years of education: Not on file   Highest education level: Not on file  Occupational History   Not on file  Tobacco Use   Smoking status: Never   Smokeless tobacco: Never  Vaping Use   Vaping Use: Never used  Substance and Sexual Activity   Alcohol use:  No    Alcohol/week: 0.0 standard drinks   Drug use: No   Sexual activity: Not Currently    Birth control/protection: Implant    Comment: undecided  Other Topics Concern   Not on file  Social History Narrative   Not on file   Social Determinants of Health   Financial Resource Strain: Not on file  Food Insecurity: Not on file  Transportation Needs: Not on file  Physical Activity: Not on file  Stress: Not on file  Social Connections: Not on file  Intimate Partner Violence: Not on file    Family History  Problem Relation Age of Onset   Diabetes Father    Hypertension Mother    Cancer Mother        melanoma   Stroke Maternal Grandfather    Stroke Paternal Grandfather    COPD Neg Hx    Heart disease Neg Hx     The following portions of the patient's history were reviewed and updated as appropriate: allergies, current medications, past family history, past medical history, past social history, past surgical history and problem list.  Review of Systems Review of Systems - Negative except as mentioned in  HPI Review of Systems - General ROS: negative for - chills, fatigue, fever, hot flashes, malaise or night sweats Hematological and Lymphatic ROS: negative for - bleeding problems or swollen lymph nodes Gastrointestinal ROS: negative for - abdominal pain, blood in stools, change in bowel habits and nausea/vomiting Musculoskeletal ROS: negative for - joint pain, muscle pain or muscular weakness Genito-Urinary ROS: negative for - change in menstrual cycle, dysmenorrhea, dyspareunia, dysuria, genital discharge, genital ulcers, hematuria, incontinence, irregular/heavy menses, nocturia or pelvic painjj  Objective:   There were no vitals taken for this visit. CONSTITUTIONAL: Well-developed, well-nourished female in no acute distress.  HENT:  Normocephalic, atraumatic.  NECK: Normal range of motion, supple, no masses.  Normal thyroid.  SKIN: Skin is warm and dry. No rash noted. Not  diaphoretic. No erythema. No pallor. [place]: Alert and oriented to person, place, and time. PSYCHIATRIC: Normal mood and affect. Normal behavior. Normal judgment and thought content. CARDIOVASCULAR:Not Examined RESPIRATORY: Not Examined BREASTS: Not Examined ABDOMEN: Soft, non distended; Non tender.  No Organomegaly. PELVIC:  External Genitalia: Normal  BUS: Normal  Vagina: Normal, clear discharge, no odor  Cervix: Normal, scant blood with placement of speculum   MUSCULOSKELETAL: Normal range of motion. No tenderness.  No cyanosis, clubbing, or edema.     Assessment:   Vaginal discharge Vaginal itching STD screening    Plan:   Swab collected, encouraged use of PH balance. Labs for STD /swab. Will follow up with results and treatments if positive. Follow up prn or for annual exam.   Philip Aspen, CNM

## 2021-05-04 LAB — HEPATITIS B SURFACE ANTIGEN: Hepatitis B Surface Ag: NEGATIVE

## 2021-05-04 LAB — HSV(HERPES SIMPLEX VRS) I + II AB-IGG
HSV 1 Glycoprotein G Ab, IgG: 2.36 index — ABNORMAL HIGH (ref 0.00–0.90)
HSV 2 IgG, Type Spec: 2.17 index — ABNORMAL HIGH (ref 0.00–0.90)

## 2021-05-04 LAB — HSV-2 IGG SUPPLEMENTAL TEST: HSV-2 IgG Supplemental Test: NEGATIVE

## 2021-05-04 LAB — HEPATITIS C ANTIBODY: Hep C Virus Ab: <0.1 s/co ratio (ref 0.0–0.9)

## 2021-05-04 LAB — HIV ANTIBODY (ROUTINE TESTING W REFLEX): HIV Screen 4th Generation wRfx: NONREACTIVE

## 2021-05-04 LAB — RPR: RPR Ser Ql: NONREACTIVE

## 2021-05-05 ENCOUNTER — Other Ambulatory Visit: Payer: Self-pay | Admitting: Certified Nurse Midwife

## 2021-05-05 LAB — CERVICOVAGINAL ANCILLARY ONLY
Bacterial Vaginitis (gardnerella): POSITIVE — AB
Candida Glabrata: NEGATIVE
Candida Vaginitis: NEGATIVE
Chlamydia: NEGATIVE
Comment: NEGATIVE
Comment: NEGATIVE
Comment: NEGATIVE
Comment: NEGATIVE
Comment: NEGATIVE
Comment: NORMAL
Neisseria Gonorrhea: NEGATIVE
Trichomonas: NEGATIVE

## 2021-05-05 MED ORDER — METRONIDAZOLE 500 MG PO TABS: 500.0000 mg | ORAL_TABLET | Freq: Two times a day (BID) | ORAL | 0 refills | Status: AC

## 2021-05-12 DIAGNOSIS — J358 Other chronic diseases of tonsils and adenoids: Secondary | ICD-10-CM | POA: Diagnosis not present

## 2021-05-12 DIAGNOSIS — J3501 Chronic tonsillitis: Secondary | ICD-10-CM | POA: Diagnosis not present

## 2021-05-12 DIAGNOSIS — J351 Hypertrophy of tonsils: Secondary | ICD-10-CM | POA: Diagnosis not present

## 2021-05-21 DIAGNOSIS — F4323 Adjustment disorder with mixed anxiety and depressed mood: Secondary | ICD-10-CM | POA: Diagnosis not present

## 2021-06-04 DIAGNOSIS — J029 Acute pharyngitis, unspecified: Secondary | ICD-10-CM | POA: Diagnosis not present

## 2021-06-04 DIAGNOSIS — Z03818 Encounter for observation for suspected exposure to other biological agents ruled out: Secondary | ICD-10-CM | POA: Diagnosis not present

## 2021-06-04 DIAGNOSIS — B373 Candidiasis of vulva and vagina: Secondary | ICD-10-CM | POA: Diagnosis not present

## 2021-06-04 DIAGNOSIS — J028 Acute pharyngitis due to other specified organisms: Secondary | ICD-10-CM | POA: Diagnosis not present

## 2021-06-04 DIAGNOSIS — J019 Acute sinusitis, unspecified: Secondary | ICD-10-CM | POA: Diagnosis not present

## 2021-07-01 DIAGNOSIS — F4323 Adjustment disorder with mixed anxiety and depressed mood: Secondary | ICD-10-CM | POA: Diagnosis not present

## 2021-07-13 ENCOUNTER — Ambulatory Visit: Payer: Medicaid Other | Admitting: Certified Nurse Midwife

## 2021-07-13 ENCOUNTER — Other Ambulatory Visit (HOSPITAL_COMMUNITY)
Admission: RE | Admit: 2021-07-13 | Discharge: 2021-07-13 | Disposition: A | Discharge status: Home / Self Care | Payer: Medicaid Other | Source: Ambulatory Visit | Attending: Certified Nurse Midwife | Admitting: Certified Nurse Midwife

## 2021-07-13 ENCOUNTER — Other Ambulatory Visit: Payer: Self-pay

## 2021-07-13 ENCOUNTER — Encounter: Payer: Self-pay | Admitting: Certified Nurse Midwife

## 2021-07-13 VITALS — BP 123/81 | HR 74 | Temp 98.2°F | Ht 66.0 in | Wt 154.7 lb

## 2021-07-13 DIAGNOSIS — R399 Unspecified symptoms and signs involving the genitourinary system: Secondary | ICD-10-CM

## 2021-07-13 DIAGNOSIS — L659 Nonscarring hair loss, unspecified: Secondary | ICD-10-CM | POA: Diagnosis not present

## 2021-07-13 DIAGNOSIS — N898 Other specified noninflammatory disorders of vagina: Secondary | ICD-10-CM

## 2021-07-13 LAB — POCT URINALYSIS DIPSTICK
Bilirubin, UA: NEGATIVE
Blood, UA: NEGATIVE
Glucose, UA: NEGATIVE
Ketones, UA: NEGATIVE
Nitrite, UA: NEGATIVE
Protein, UA: NEGATIVE
Spec Grav, UA: 1.015 (ref 1.010–1.025)
Urobilinogen, UA: 0.2 E.U./dL
pH, UA: 7 (ref 5.0–8.0)

## 2021-07-13 NOTE — Addendum Note (Signed)
Addended by: Ova Freshwater on: 07/13/2021 10:56 AM   Modules accepted: Orders

## 2021-07-13 NOTE — Progress Notes (Signed)
GYN ENCOUNTER NOTE  Subjective:       Cynthia Harrell is a 35 y.o. [phone] female is here for gynecologic evaluation of the following issues:  1. Pt c/o vaginal itching, odor, irritation and burning with urination x 1 wk. 2. Requesting labs for thyroid due to hair loss  .     Gynecologic History No LMP recorded (lmp unknown). Contraception: OCP (estrogen/progesterone) Last Pap: 10/12/20. Results were: ASCUS/neg HPV Last mammogram: n/a.   Obstetric History OB History  Gravida Para Term Preterm AB Living  4 3 3   1 3   SAB IAB Ectopic Multiple Live Births    1   0 3    # Outcome Date GA Lbr Len/2nd Weight Sex Delivery Anes PTL Lv  4 Term 08/09/19 [redacted]w[redacted]d  8 lb 13.8 oz (4.02 kg) F Vag-Spont None  LIV  3 Term 11/06/17 [redacted]w[redacted]d / 00:43 9 lb 4.9 oz (4.22 kg) F Vag-Spont EPI  LIV  2 Term 12/04/06 [redacted]w[redacted]d  8 lb 1 oz (3.657 kg) F Vag-Spont EPI N LIV  1 IAB      TAB       Past Medical History:  Diagnosis Date   Gestational diabetes    Herpes simplex without complication    High-risk sexual behavior    Microalbuminuria     Past Surgical History:  Procedure Laterality Date   THERAPEUTIC ABORTION  2008    Current Outpatient Medications on File Prior to Visit  Medication Sig Dispense Refill   norelgestromin-ethinyl estradiol (ORTHO EVRA) 150-35 MCG/24HR transdermal patch Place 1 patch onto the skin once a week. 3 patch 12   valACYclovir (VALTREX) 500 MG tablet TAKE 1 TABLET BY MOUTH TWICE A DAY 180 tablet 1   No current facility-administered medications on file prior to visit.    No Known Allergies  Social History   Socioeconomic History   Marital status: Significant Other    Spouse name: Doren Custard    Number of children: Not on file   Years of education: Not on file   Highest education level: Not on file  Occupational History   Not on file  Tobacco Use   Smoking status: Never   Smokeless tobacco: Never  Vaping Use   Vaping Use: Never used  Substance and Sexual Activity    Alcohol use: No    Alcohol/week: 0.0 standard drinks   Drug use: No   Sexual activity: Not Currently    Birth control/protection: Implant    Comment: undecided  Other Topics Concern   Not on file  Social History Narrative   Not on file   Social Determinants of Health   Financial Resource Strain: Not on file  Food Insecurity: Not on file  Transportation Needs: Not on file  Physical Activity: Not on file  Stress: Not on file  Social Connections: Not on file  Intimate Partner Violence: Not on file    Family History  Problem Relation Age of Onset   Diabetes Father    Hypertension Mother    Cancer Mother        melanoma   Stroke Maternal Grandfather    Stroke Paternal Grandfather    COPD Neg Hx    Heart disease Neg Hx     The following portions of the patient's history were reviewed and updated as appropriate: allergies, current medications, past family history, past medical history, past social history, past surgical history and problem list.  Review of Systems Review of Systems - Negative except as mentioned  in HPI Review of Systems - General ROS: negative for - chills, fatigue, fever, hot flashes, malaise or night sweats Hematological and Lymphatic ROS: negative for - bleeding problems or swollen lymph nodes Gastrointestinal ROS: negative for - abdominal pain, blood in stools, change in bowel habits and nausea/vomiting Musculoskeletal ROS: negative for - joint pain, muscle pain or muscular weakness Genito-Urinary ROS: negative for - change in menstrual cycle, dysmenorrhea, dyspareunia, dysuria, genital discharge, genital ulcers, hematuria, incontinence, irregular/heavy menses, nocturia or pelvic pain. Positive for vaginal odor, itching , irritation and burning with urination.   Objective:   BP 123/81   Pulse 74   Temp 98.2 F (36.8 C)   Ht 5\' 6"  (1.676 m)   Wt 154 lb 11.2 oz (70.2 kg)   LMP  (LMP Unknown)   BMI 24.97 kg/m  CONSTITUTIONAL: Well-developed,  well-nourished female in no acute distress.  HENT:  Normocephalic, atraumatic.  NECK: Normal range of motion, supple, no masses.  Normal thyroid.  SKIN: Skin is warm and dry. No rash noted. Not diaphoretic. No erythema. No pallor. [place]: Alert and oriented to person, place, and time. PSYCHIATRIC: Normal mood and affect. Normal behavior. Normal judgment and thought content. CARDIOVASCULAR:Not Examined RESPIRATORY: Not Examined BREASTS: Not Examined ABDOMEN: Soft, non distended; Non tender.  No Organomegaly. PELVIC:  External Genitalia: Normal  BUS: Normal  Vagina: Normal, mild redness , white discharge  Cervix: Normal MUSCULOSKELETAL: Normal range of motion. No tenderness.  No cyanosis, clubbing, or edema.     Assessment:   1. Vagina itching  - POCT urinalysis dipstick - Urine Culture - Cervicovaginal ancillary only; Future  2. Vaginal discharge  - POCT urinalysis dipstick - Urine Culture - Cervicovaginal ancillary only; Future  3. UTI symptoms  - POCT urinalysis dipstick - Urine Culture - Cervicovaginal ancillary only; Future   4. TSH with panel -hair loss   Plan:   Swab collected for vaginal  discharge, urine culture for UTI , tsh panel for hair loss. Will follow up with results.   Philip Aspen, CNM

## 2021-07-14 LAB — THYROID PANEL WITH TSH
Free Thyroxine Index: 2 (ref 1.2–4.9)
T3 Uptake Ratio: 25 % (ref 24–39)
T4, Total: 7.9 ug/dL (ref 4.5–12.0)
TSH: 1.41 u[IU]/mL (ref 0.450–4.500)

## 2021-07-14 LAB — CERVICOVAGINAL ANCILLARY ONLY
Bacterial Vaginitis (gardnerella): NEGATIVE
Candida Glabrata: NEGATIVE
Candida Vaginitis: NEGATIVE
Comment: NEGATIVE
Comment: NEGATIVE
Comment: NEGATIVE

## 2021-07-17 LAB — URINE CULTURE

## 2021-07-22 DIAGNOSIS — F4323 Adjustment disorder with mixed anxiety and depressed mood: Secondary | ICD-10-CM | POA: Diagnosis not present

## 2021-08-10 DIAGNOSIS — F4323 Adjustment disorder with mixed anxiety and depressed mood: Secondary | ICD-10-CM | POA: Diagnosis not present

## 2021-08-24 DIAGNOSIS — F4323 Adjustment disorder with mixed anxiety and depressed mood: Secondary | ICD-10-CM | POA: Diagnosis not present

## 2021-09-17 DIAGNOSIS — F4323 Adjustment disorder with mixed anxiety and depressed mood: Secondary | ICD-10-CM | POA: Diagnosis not present

## 2021-09-28 DIAGNOSIS — J019 Acute sinusitis, unspecified: Secondary | ICD-10-CM | POA: Diagnosis not present

## 2021-09-28 DIAGNOSIS — B9689 Other specified bacterial agents as the cause of diseases classified elsewhere: Secondary | ICD-10-CM | POA: Diagnosis not present

## 2021-10-01 ENCOUNTER — Telehealth: Payer: Self-pay | Admitting: Certified Nurse Midwife

## 2021-10-01 NOTE — Telephone Encounter (Signed)
Pt called and stated she would like to know if you could send a prescription to her pharmacy for a diflucan pill since she is on a antibiotic.

## 2021-10-04 ENCOUNTER — Other Ambulatory Visit: Payer: Self-pay

## 2021-10-04 MED ORDER — FLUCONAZOLE 150 MG PO TABS: 150.0000 mg | ORAL_TABLET | Freq: Once | ORAL | 0 refills | Status: AC

## 2021-10-04 NOTE — Telephone Encounter (Signed)
Rx sent 

## 2021-10-19 ENCOUNTER — Encounter: Payer: Medicaid Other | Admitting: Certified Nurse Midwife

## 2021-10-27 ENCOUNTER — Other Ambulatory Visit: Payer: Self-pay

## 2021-10-27 ENCOUNTER — Other Ambulatory Visit (HOSPITAL_COMMUNITY)
Admission: RE | Admit: 2021-10-27 | Discharge: 2021-10-27 | Disposition: A | Discharge status: Home / Self Care | Payer: Medicaid Other | Source: Ambulatory Visit | Attending: Certified Nurse Midwife | Admitting: Certified Nurse Midwife

## 2021-10-27 ENCOUNTER — Ambulatory Visit: Payer: Medicaid Other | Admitting: Certified Nurse Midwife

## 2021-10-27 VITALS — BP 118/80 | HR 82 | Ht 66.0 in | Wt 151.8 lb

## 2021-10-27 DIAGNOSIS — Z113 Encounter for screening for infections with a predominantly sexual mode of transmission: Secondary | ICD-10-CM

## 2021-10-27 NOTE — Progress Notes (Signed)
GYN ENCOUNTER NOTE  Subjective:       Cynthia Harrell is a 36 y.o. [phone] female is here for gynecologic evaluation of the following issues:  1. Vaginal discharge with itching x 1 wk. She has history of recurrent B V and yeast infection 2. New partner requesting STD testing.     Gynecologic History No LMP recorded (lmp unknown). Contraception: Ortho-Evra patches weekly Last Pap: 10/12/20. Results were: ACUS/-HPV Last mammogram: na/ .    Obstetric History OB History  Gravida Para Term Preterm AB Living  4 3 3   1 3   SAB IAB Ectopic Multiple Live Births    1   0 3    # Outcome Date GA Lbr Len/2nd Weight Sex Delivery Anes PTL Lv  4 Term 08/09/19 [redacted]w[redacted]d  8 lb 13.8 oz (4.02 kg) F Vag-Spont None  LIV  3 Term 11/06/17 [redacted]w[redacted]d / 00:43 9 lb 4.9 oz (4.22 kg) F Vag-Spont EPI  LIV  2 Term 12/04/06 [redacted]w[redacted]d  8 lb 1 oz (3.657 kg) F Vag-Spont EPI N LIV  1 IAB      TAB       Past Medical History:  Diagnosis Date   Gestational diabetes    Herpes simplex without complication    High-risk sexual behavior    Microalbuminuria     Past Surgical History:  Procedure Laterality Date   THERAPEUTIC ABORTION  2008    Current Outpatient Medications on File Prior to Visit  Medication Sig Dispense Refill   norelgestromin-ethinyl estradiol (ORTHO EVRA) 150-35 MCG/24HR transdermal patch Place 1 patch onto the skin once a week. 3 patch 12   valACYclovir (VALTREX) 500 MG tablet TAKE 1 TABLET BY MOUTH TWICE A DAY 180 tablet 1   No current facility-administered medications on file prior to visit.    No Known Allergies  Social History   Socioeconomic History   Marital status: Significant Other    Spouse name: Doren Custard    Number of children: Not on file   Years of education: Not on file   Highest education level: Not on file  Occupational History   Not on file  Tobacco Use   Smoking status: Never   Smokeless tobacco: Never  Vaping Use   Vaping Use: Never used  Substance and Sexual Activity    Alcohol use: No    Alcohol/week: 0.0 standard drinks   Drug use: No   Sexual activity: Not Currently    Birth control/protection: Implant    Comment: undecided  Other Topics Concern   Not on file  Social History Narrative   Not on file   Social Determinants of Health   Financial Resource Strain: Not on file  Food Insecurity: Not on file  Transportation Needs: Not on file  Physical Activity: Not on file  Stress: Not on file  Social Connections: Not on file  Intimate Partner Violence: Not on file    Family History  Problem Relation Age of Onset   Diabetes Father    Hypertension Mother    Cancer Mother        melanoma   Stroke Maternal Grandfather    Stroke Paternal Grandfather    COPD Neg Hx    Heart disease Neg Hx     The following portions of the patient's history were reviewed and updated as appropriate: allergies, current medications, past family history, past medical history, past social history, past surgical history and problem list.  Review of Systems Review of Systems - Negative except as  mentioned in HPI Review of Systems - General ROS: negative for - chills, fatigue, fever, hot flashes, malaise or night sweats Hematological and Lymphatic ROS: negative for - bleeding problems or swollen lymph nodes Gastrointestinal ROS: negative for - abdominal pain, blood in stools, change in bowel habits and nausea/vomiting Musculoskeletal ROS: negative for - joint pain, muscle pain or muscular weakness Genito-Urinary ROS: negative for - change in menstrual cycle, dysmenorrhea, dyspareunia, dysuria, genital discharge, genital ulcers, hematuria, incontinence, irregular/heavy menses, nocturia or pelvic pain. Positive for vaginal discharge with itching.   Objective:   BP 118/80    Pulse 82    Ht 5\' 6"  (1.676 m)    Wt 151 lb 12.8 oz (68.9 kg)    LMP  (LMP Unknown)    BMI 24.50 kg/m  CONSTITUTIONAL: Well-developed, well-nourished female in no acute distress.  HENT:   Normocephalic, atraumatic.  NECK: Normal range of motion, supple, no masses.  Normal thyroid.  SKIN: Skin is warm and dry. No rash noted. Not diaphoretic. No erythema. No pallor. [place]: Alert and oriented to person, place, and time. PSYCHIATRIC: Normal mood and affect. Normal behavior. Normal judgment and thought content. CARDIOVASCULAR:Not Examined RESPIRATORY: Not Examined BREASTS: Not Examined ABDOMEN: Soft, non distended; Non tender.  No Organomegaly. PELVIC:  External Genitalia: Normal  BUS: Normal  Vagina: Normal  Cervix: Normal, white discharge, no odor MUSCULOSKELETAL: Normal range of motion. No tenderness.  No cyanosis, clubbing, or edema.   Assessment:   Vaginal discharge STD screening   Plan:   Vaginal swab collected. Labs for STD ordered. Will follow up with results. Return PRN or for annual exam.   Philip Aspen, CNM

## 2021-10-27 NOTE — Addendum Note (Signed)
Addended by: Ova Freshwater on: 10/27/2021 04:37 PM   Modules accepted: Orders

## 2021-10-28 LAB — HEPATITIS C ANTIBODY: Hep C Virus Ab: <0.1 s/co ratio (ref 0.0–0.9)

## 2021-10-28 LAB — HIV ANTIBODY (ROUTINE TESTING W REFLEX): HIV Screen 4th Generation wRfx: NONREACTIVE

## 2021-10-28 LAB — RPR: RPR Ser Ql: NONREACTIVE

## 2021-10-28 LAB — HEPATITIS B SURFACE ANTIGEN: Hepatitis B Surface Ag: NEGATIVE

## 2021-10-29 LAB — CERVICOVAGINAL ANCILLARY ONLY
Bacterial Vaginitis (gardnerella): NEGATIVE
Candida Glabrata: NEGATIVE
Candida Vaginitis: NEGATIVE
Chlamydia: NEGATIVE
Comment: NEGATIVE
Comment: NEGATIVE
Comment: NEGATIVE
Comment: NEGATIVE
Comment: NEGATIVE
Comment: NORMAL
Neisseria Gonorrhea: NEGATIVE
Trichomonas: NEGATIVE

## 2021-10-31 ENCOUNTER — Encounter: Payer: Self-pay | Admitting: Certified Nurse Midwife

## 2021-12-23 DIAGNOSIS — F4323 Adjustment disorder with mixed anxiety and depressed mood: Secondary | ICD-10-CM | POA: Diagnosis not present

## 2022-01-13 DIAGNOSIS — F4323 Adjustment disorder with mixed anxiety and depressed mood: Secondary | ICD-10-CM | POA: Diagnosis not present

## 2022-01-18 ENCOUNTER — Other Ambulatory Visit (HOSPITAL_COMMUNITY)
Admission: RE | Admit: 2022-01-18 | Discharge: 2022-01-18 | Disposition: A | Discharge status: Home / Self Care | Payer: Medicaid Other | Source: Ambulatory Visit | Attending: Certified Nurse Midwife | Admitting: Certified Nurse Midwife

## 2022-01-18 ENCOUNTER — Encounter: Payer: Self-pay | Admitting: Certified Nurse Midwife

## 2022-01-18 ENCOUNTER — Ambulatory Visit (INDEPENDENT_AMBULATORY_CARE_PROVIDER_SITE_OTHER): Payer: Medicaid Other | Admitting: Certified Nurse Midwife

## 2022-01-18 VITALS — BP 131/84 | HR 91 | Ht 66.0 in | Wt 150.6 lb

## 2022-01-18 DIAGNOSIS — Z13220 Encounter for screening for lipoid disorders: Secondary | ICD-10-CM

## 2022-01-18 DIAGNOSIS — Z01419 Encounter for gynecological examination (general) (routine) without abnormal findings: Secondary | ICD-10-CM | POA: Diagnosis not present

## 2022-01-18 DIAGNOSIS — N390 Urinary tract infection, site not specified: Secondary | ICD-10-CM

## 2022-01-18 DIAGNOSIS — R319 Hematuria, unspecified: Secondary | ICD-10-CM

## 2022-01-18 DIAGNOSIS — Z113 Encounter for screening for infections with a predominantly sexual mode of transmission: Secondary | ICD-10-CM

## 2022-01-18 MED ORDER — NORETHIN ACE-ETH ESTRAD-FE 1-20 MG-MCG PO TABS: 1.0000 | ORAL_TABLET | Freq: Every day | ORAL | 3 refills | Status: DC

## 2022-01-18 MED ORDER — VALACYCLOVIR HCL 500 MG PO TABS: 500.0000 mg | ORAL_TABLET | Freq: Two times a day (BID) | ORAL | 1 refills | Status: DC

## 2022-01-18 NOTE — Progress Notes (Signed)
? ? ?GYNECOLOGY ANNUAL PREVENTATIVE CARE ENCOUNTER NOTE ? ?History:    ? DAWNMARIE BREON is a 36 y.o. [phone] female here for a routine annual gynecologic exam.  Current complaints: none.   Denies abnormal vaginal bleeding, discharge, pelvic pain, problems with intercourse or other gynecologic concerns.  ?  ? ?Social ?Relationship: single/dating  ?Living: with children ?Work: [employer]  ?Exercise: none  ?Smoke/Alcohol/drug use: denies use  ? ?Gynecologic History ?Patient's last menstrual period was 01/05/2022 (exact date). ?Contraception: OCP (estrogen/progesterone) ?Last Pap: 10/12/2020. Results were: ASCUS with negative HPV ?Last mammogram: n/a .  ? [facility] - 01/18/22 0902   ? ?  ? Pregnancy Intention Screening  ? Does the patient want to become pregnant in the next year? No   ? Does the patient's partner want to become pregnant in the next year? No   ? Would the patient like to discuss contraceptive options today? N/A   ? ?  ?  ? ?  ? ?The pregnancy intention screening data noted above was reviewed. Potential methods of contraception were discussed. The patient elected to proceed with ocp. ? ? ?Obstetric History ?OB History  ?Gravida Para Term Preterm AB Living  ?'4 3 3   1 3  '$ ?SAB IAB Ectopic Multiple Live Births  ?  1   0 3  ?  ?# Outcome Date GA Lbr Len/2nd Weight Sex Delivery Anes PTL Lv  ?4 Term 08/09/19 [redacted]w[redacted]d 8 lb 13.8 oz (4.02 kg) F Vag-Spont None  LIV  ?3 Term 11/06/17 468w4d 00:43 9 lb 4.9 oz (4.22 kg) F Vag-Spont EPI  LIV  ?2 Term 12/04/06 4265w0d lb 1 oz (3.657 kg) F Vag-Spont EPI N LIV  ?1 IAB      TAB     ? ? ?Past Medical History:  ?Diagnosis Date  ? Gestational diabetes   ? Herpes simplex without complication   ? High-risk sexual behavior   ? Microalbuminuria   ? ? ?Past Surgical History:  ?Procedure Laterality Date  ? THERAPEUTIC ABORTION  2008  ? ? ?Current Outpatient Medications on File Prior to Visit  ?Medication Sig Dispense Refill  ? norelgestromin-ethinyl estradiol (ORTHO EVRA) 150-35  MCG/24HR transdermal patch Place 1 patch onto the skin once a week. 3 patch 12  ? valACYclovir (VALTREX) 500 MG tablet TAKE 1 TABLET BY MOUTH TWICE A DAY 180 tablet 1  ? ?No current facility-administered medications on file prior to visit.  ? ? ?No Known Allergies ? ?Social History:  reports that she has never smoked. She has never used smokeless tobacco. She reports that she does not drink alcohol and does not use drugs. ? ?Family History  ?Problem Relation Age of Onset  ? Diabetes Father   ? Hypertension Mother   ? Cancer Mother   ?     melanoma  ? Stroke Maternal Grandfather   ? Stroke Paternal Grandfather   ? COPD Neg Hx   ? Heart disease Neg Hx   ? ? ?The following portions of the patient's history were reviewed and updated as appropriate: allergies, current medications, past family history, past medical history, past social history, past surgical history and problem list. ? ?Review of Systems ?Pertinent items noted in HPI and remainder of comprehensive ROS otherwise negative. ? ?Physical Exam:  ?BP 131/84   Pulse 91   Ht '5\' 6"'$  (1.676 m)   Wt 150 lb 9.6 oz (68.3 kg)   LMP 01/05/2022 (Exact Date)   BMI 24.31 kg/m?  ?CONSTITUTIONAL:  Well-developed, well-nourished female in no acute distress.  ?HENT:  Normocephalic, atraumatic, External right and left ear normal. Oropharynx is clear and moist ?EYES: Conjunctivae and EOM are normal. Pupils are equal, round, and reactive to light. No scleral icterus.  ?NECK: Normal range of motion, supple, no masses.  Normal thyroid.  ?SKIN: Skin is warm and dry. No rash noted. Not diaphoretic. No erythema. No pallor. ?MUSCULOSKELETAL: Normal range of motion. No tenderness.  No cyanosis, clubbing, or edema.  2+ distal pulses. ?NEUROLOGIC: Alert and oriented to person, place, and time. Normal reflexes, muscle tone coordination.  ?PSYCHIATRIC: Normal mood and affect. Normal behavior. Normal judgment and thought content. ?CARDIOVASCULAR: Normal heart rate noted, regular  rhythm ?RESPIRATORY: Clear to auscultation bilaterally. Effort and breath sounds normal, no problems with respiration noted. ?BREASTS: Symmetric in size. No masses, tenderness, skin changes, nipple drainage, or lymphadenopathy bilaterally.  ?ABDOMEN: Soft, no distention noted.  No tenderness, rebound or guarding.  ?PELVIC: Normal appearing external genitalia and urethral meatus; normal appearing vaginal mucosa and cervix.  No abnormal discharge noted.  Pap smear not due. Swab collcected for STD screening.  Normal uterine size, no other palpable masses, no uterine or adnexal tenderness.  . ?  ?Assessment and Plan:  ?  1. Well woman exam with routine gynecological exam ? ?Pap: not due until 2025 ?Mammogram : n/a  ?Labs:Lipid profile, STD screening  ?Refills: OCP, Valtrex ?Referral: none ?Routine preventative health maintenance measures emphasized. ?Please refer to After Visit Summary for other counseling recommendations.  ?   ? ?Philip Aspen, CNM ?[facility] ?[facility]   ?

## 2022-01-19 LAB — LIPID PANEL
Chol/HDL Ratio: 2.5 ratio (ref 0.0–4.4)
Cholesterol, Total: 144 mg/dL (ref 100–199)
HDL: 58 mg/dL (ref >39)
LDL Chol Calc (NIH): 66 mg/dL (ref 0–99)
Triglycerides: 109 mg/dL (ref 0–149)
VLDL Cholesterol Cal: 20 mg/dL (ref 5–40)

## 2022-01-19 LAB — HEPATITIS C ANTIBODY: Hep C Virus Ab: NONREACTIVE

## 2022-01-19 LAB — RPR: RPR Ser Ql: NONREACTIVE

## 2022-01-19 LAB — HEPATITIS B SURFACE ANTIGEN: Hepatitis B Surface Ag: NEGATIVE

## 2022-01-19 LAB — HIV ANTIBODY (ROUTINE TESTING W REFLEX): HIV Screen 4th Generation wRfx: NONREACTIVE

## 2022-01-20 DIAGNOSIS — F4323 Adjustment disorder with mixed anxiety and depressed mood: Secondary | ICD-10-CM | POA: Diagnosis not present

## 2022-01-20 LAB — CERVICOVAGINAL ANCILLARY ONLY
Bacterial Vaginitis (gardnerella): NEGATIVE
Candida Glabrata: NEGATIVE
Candida Vaginitis: NEGATIVE
Chlamydia: NEGATIVE
Comment: NEGATIVE
Comment: NEGATIVE
Comment: NEGATIVE
Comment: NEGATIVE
Comment: NEGATIVE
Comment: NORMAL
Neisseria Gonorrhea: NEGATIVE
Trichomonas: NEGATIVE

## 2022-02-03 DIAGNOSIS — F4323 Adjustment disorder with mixed anxiety and depressed mood: Secondary | ICD-10-CM | POA: Diagnosis not present

## 2022-02-10 DIAGNOSIS — F4323 Adjustment disorder with mixed anxiety and depressed mood: Secondary | ICD-10-CM | POA: Diagnosis not present

## 2022-04-06 ENCOUNTER — Encounter: Payer: Medicaid Other | Admitting: Certified Nurse Midwife

## 2022-04-13 ENCOUNTER — Other Ambulatory Visit (HOSPITAL_COMMUNITY)
Admission: RE | Admit: 2022-04-13 | Discharge: 2022-04-13 | Disposition: A | Discharge status: Home / Self Care | Payer: Medicaid Other | Source: Ambulatory Visit | Attending: Certified Nurse Midwife | Admitting: Certified Nurse Midwife

## 2022-04-13 ENCOUNTER — Ambulatory Visit: Payer: Medicaid Other | Admitting: Certified Nurse Midwife

## 2022-04-13 ENCOUNTER — Encounter: Payer: Self-pay | Admitting: Certified Nurse Midwife

## 2022-04-13 VITALS — BP 110/65 | HR 85 | Ht 66.0 in | Wt 155.0 lb

## 2022-04-13 DIAGNOSIS — N898 Other specified noninflammatory disorders of vagina: Secondary | ICD-10-CM

## 2022-04-13 NOTE — Progress Notes (Signed)
GYN ENCOUNTER NOTE  Subjective:       Cynthia Harrell is a 36 y.o. [phone] female is here for gynecologic evaluation of the following issues:  1. Increased vaginal discharge .     Gynecologic History Patient's last menstrual period was 03/29/2022 (exact date). Contraception: OCP (estrogen/progesterone) Last Pap: 10/12/2020. Results were: ascus/ negative HPV Last mammogram: n/a .   Obstetric History OB History  Gravida Para Term Preterm AB Living  '4 3 3   1 3  '$ SAB IAB Ectopic Multiple Live Births    1   0 3    # Outcome Date GA Lbr Len/2nd Weight Sex Delivery Anes PTL Lv  4 Term 08/09/19 [redacted]w[redacted]d 8 lb 13.8 oz (4.02 kg) F Vag-Spont None  LIV  3 Term 11/06/17 498w4d 00:43 9 lb 4.9 oz (4.22 kg) F Vag-Spont EPI  LIV  2 Term 12/04/06 4260w0d lb 1 oz (3.657 kg) F Vag-Spont EPI N LIV  1 IAB      TAB       Past Medical History:  Diagnosis Date   Gestational diabetes    Herpes simplex without complication    High-risk sexual behavior    Microalbuminuria     Past Surgical History:  Procedure Laterality Date   THERAPEUTIC ABORTION  2008    Current Outpatient Medications on File Prior to Visit  Medication Sig Dispense Refill   norethindrone-ethinyl estradiol-FE (LOESTRIN FE) 1-20 MG-MCG tablet Take 1 tablet by mouth daily. 84 tablet 3   valACYclovir (VALTREX) 500 MG tablet Take 1 tablet (500 mg total) by mouth 2 (two) times daily. 180 tablet 1   No current facility-administered medications on file prior to visit.    No Known Allergies  Social History   Socioeconomic History   Marital status: Significant Other    Spouse name: PhiDoren Custard Number of children: Not on file   Years of education: Not on file   Highest education level: Not on file  Occupational History   Not on file  Tobacco Use   Smoking status: Never   Smokeless tobacco: Never  Vaping Use   Vaping Use: Never used  Substance and Sexual Activity   Alcohol use: No    Alcohol/week: 0.0 standard drinks of  alcohol   Drug use: No   Sexual activity: Yes    Birth control/protection: Pill  Other Topics Concern   Not on file  Social History Narrative   Not on file   Social Determinants of Health   Financial Resource Strain: Low Risk  (08/05/2019)   Overall Financial Resource Strain (CARDIA)    Difficulty of Paying Living Expenses: Not hard at all  Food Insecurity: No Food Insecurity (08/05/2019)   Hunger Vital Sign    Worried About Running Out of Food in the Last Year: Never true    Ran Out of Food in the Last Year: Never true  Transportation Needs: No Transportation Needs (08/05/2019)   PRAPARE - TraHydrologistedical): No    Lack of Transportation (Non-Medical): No  Physical Activity: Insufficiently Active (08/05/2019)   Exercise Vital Sign    Days of Exercise per Week: 3 days    Minutes of Exercise per Session: 30 min  Stress: No Stress Concern Present (08/05/2019)   [place] Feeling of Stress : Only a little  Social Connections: Unknown (08/05/2019)   Social Connection and Isolation Panel [  NHANES]    Frequency of Communication with Friends and Family: More than three times a week    Frequency of Social Gatherings with Friends and Family: More than three times a week    Attends Religious Services: Patient refused    Marine scientist or Organizations: Patient refused    Attends Music therapist: Patient refused    Marital Status: Living with partner  Intimate Partner Violence: Not on file    Family History  Problem Relation Age of Onset   Diabetes Father    Hypertension Mother    Cancer Mother        melanoma   Stroke Maternal Grandfather    Stroke Paternal Grandfather    COPD Neg Hx    Heart disease Neg Hx     The following portions of the patient's history were reviewed and updated as appropriate: allergies, current medications, past family history,  past medical history, past social history, past surgical history and problem list.  Review of Systems Review of Systems - Negative except as mentioned in HPI Review of Systems - General ROS: negative for - chills, fatigue, fever, hot flashes, malaise or night sweats Hematological and Lymphatic ROS: negative for - bleeding problems or swollen lymph nodes Gastrointestinal ROS: negative for - abdominal pain, blood in stools, change in bowel habits and nausea/vomiting Musculoskeletal ROS: negative for - joint pain, muscle pain or muscular weakness Genito-Urinary ROS: negative for - change in menstrual cycle, dysmenorrhea, dyspareunia, dysuria,  genital ulcers, hematuria, incontinence, irregular/heavy menses, nocturia or pelvic pain. Positive for increased vaginal discharge  Objective:   BP 110/65   Pulse 85   Ht '5\' 6"'$  (1.676 m)   Wt 155 lb (70.3 kg)   LMP 03/29/2022 (Exact Date)   BMI 25.02 kg/m  CONSTITUTIONAL: Well-developed, well-nourished female in no acute distress.  HENT:  Normocephalic, atraumatic.  NECK: Normal range of motion, supple, no masses.  Normal thyroid.  SKIN: Skin is warm and dry. No rash noted. Not diaphoretic. No erythema. No pallor. [place]: Alert and oriented to person, place, and time. PSYCHIATRIC: Normal mood and affect. Normal behavior. Normal judgment and thought content. CARDIOVASCULAR:Not Examined RESPIRATORY: Not Examined BREASTS: Not Examined ABDOMEN: Soft, non distended; Non tender.  No Organomegaly. PELVIC:  External Genitalia: Normal  BUS: Normal  Vagina: Normal  Cervix: Normal, pinck, clear to white discharge  MUSCULOSKELETAL: Normal range of motion. No tenderness.  No cyanosis, clubbing, or edema.     Assessment:   1. Vaginal discharge      Plan:   Swab collected. Will follow up with results. Encouraged self help measures.    Philip Aspen, CNM

## 2022-04-14 ENCOUNTER — Encounter: Payer: Medicaid Other | Admitting: Certified Nurse Midwife

## 2022-04-15 LAB — CERVICOVAGINAL ANCILLARY ONLY
Bacterial Vaginitis (gardnerella): NEGATIVE
Candida Glabrata: NEGATIVE
Candida Vaginitis: NEGATIVE
Comment: NEGATIVE
Comment: NEGATIVE
Comment: NEGATIVE
Comment: NEGATIVE
Trichomonas: NEGATIVE

## 2022-07-06 ENCOUNTER — Encounter: Payer: Self-pay | Admitting: Certified Nurse Midwife

## 2022-07-07 ENCOUNTER — Ambulatory Visit (INDEPENDENT_AMBULATORY_CARE_PROVIDER_SITE_OTHER): Payer: Medicaid Other

## 2022-07-07 VITALS — BP 133/84 | Ht 66.0 in | Wt 158.0 lb

## 2022-07-07 DIAGNOSIS — R102 Pelvic and perineal pain: Secondary | ICD-10-CM | POA: Diagnosis not present

## 2022-07-07 LAB — POCT URINALYSIS DIPSTICK
Blood, UA: NEGATIVE
Glucose, UA: NEGATIVE
Leukocytes, UA: NEGATIVE
Nitrite, UA: NEGATIVE
Protein, UA: NEGATIVE
Spec Grav, UA: 1.01 (ref 1.010–1.025)
Urobilinogen, UA: 0.2 E.U./dL
pH, UA: 6.5 (ref 5.0–8.0)

## 2022-07-07 NOTE — Progress Notes (Signed)
Pt reports that she thinks she has a UTI, symptoms are pelvic pressure and after she urinates she feels as if she still needs to go . Urine dip in office looks normal, urine culture sent to lab.

## 2022-07-09 ENCOUNTER — Encounter: Payer: Self-pay | Admitting: Certified Nurse Midwife

## 2022-07-09 LAB — URINE CULTURE

## 2022-09-22 ENCOUNTER — Ambulatory Visit: Payer: Medicaid Other | Admitting: Certified Nurse Midwife

## 2022-09-22 ENCOUNTER — Other Ambulatory Visit (HOSPITAL_COMMUNITY)
Admission: RE | Admit: 2022-09-22 | Discharge: 2022-09-22 | Disposition: A | Discharge status: Home / Self Care | Payer: Medicaid Other | Source: Ambulatory Visit | Attending: Certified Nurse Midwife | Admitting: Certified Nurse Midwife

## 2022-09-22 ENCOUNTER — Encounter: Payer: Self-pay | Admitting: Certified Nurse Midwife

## 2022-09-22 VITALS — BP 130/81 | HR 78 | Resp 16 | Wt 155.1 lb

## 2022-09-22 DIAGNOSIS — D229 Melanocytic nevi, unspecified: Secondary | ICD-10-CM | POA: Diagnosis not present

## 2022-09-22 DIAGNOSIS — L905 Scar conditions and fibrosis of skin: Secondary | ICD-10-CM | POA: Diagnosis not present

## 2022-09-22 DIAGNOSIS — N898 Other specified noninflammatory disorders of vagina: Secondary | ICD-10-CM | POA: Insufficient documentation

## 2022-09-22 NOTE — Patient Instructions (Signed)

## 2022-09-22 NOTE — Progress Notes (Signed)
GYN ENCOUNTER NOTE  Subjective:       Cynthia Harrell is a 36 y.o. [phone] female is here for gynecologic evaluation of the following issues:  1. Vaginal discharge increased 1-2 wks 2. Firm area on right nipple, pt admits that she shaves the hair around nipple 3. Mole on her left side back hip area, concerns for shape.     Gynecologic History No LMP recorded (within weeks). Contraception: OCP (estrogen/progesterone) Last Pap: 10/12/20. Results were: abnormal, ASCUS/-HPV Last mammogram: n/a .   Obstetric History OB History  Gravida Para Term Preterm AB Living  '4 3 3   1 3  '$ SAB IAB Ectopic Multiple Live Births    1   0 3    # Outcome Date GA Lbr Len/2nd Weight Sex Delivery Harrell PTL Lv  4 Term 08/09/19 [redacted]w[redacted]d 8 lb 13.8 oz (4.02 kg) F Vag-Spont None  LIV  3 Term 11/06/17 493w4d 00:43 9 lb 4.9 oz (4.22 kg) F Vag-Spont EPI  LIV  2 Term 12/04/06 4253w0d lb 1 oz (3.657 kg) F Vag-Spont EPI N LIV  1 IAB      TAB       Past Medical History:  Diagnosis Date   Gestational diabetes    Herpes simplex without complication    High-risk sexual behavior    Microalbuminuria     Past Surgical History:  Procedure Laterality Date   THERAPEUTIC ABORTION  2008    Current Outpatient Medications on File Prior to Visit  Medication Sig Dispense Refill   norethindrone-ethinyl estradiol-FE (LOESTRIN FE) 1-20 MG-MCG tablet Take 1 tablet by mouth daily. 84 tablet 3   valACYclovir (VALTREX) 500 MG tablet Take 1 tablet (500 mg total) by mouth 2 (two) times daily. 180 tablet 1   No current facility-administered medications on file prior to visit.    No Known Allergies  Social History   Socioeconomic History   Marital status: Significant Other    Spouse name: PhiDoren Custard Number of children: Not on file   Years of education: Not on file   Highest education level: Not on file  Occupational History   Not on file  Tobacco Use   Smoking status: Never   Smokeless tobacco: Never  Vaping Use    Vaping Use: Never used  Substance and Sexual Activity   Alcohol use: No    Alcohol/week: 0.0 standard drinks of alcohol   Drug use: No   Sexual activity: Yes    Birth control/protection: Pill  Other Topics Concern   Not on file  Social History Narrative   Not on file   Social Determinants of Health   Financial Resource Strain: Low Risk  (08/05/2019)   Overall Financial Resource Strain (CARDIA)    Difficulty of Paying Living Expenses: Not hard at all  Food Insecurity: No Food Insecurity (08/05/2019)   Hunger Vital Sign    Worried About Running Out of Food in the Last Year: Never true    Ran Out of Food in the Last Year: Never true  Transportation Needs: No Transportation Needs (08/05/2019)   PRAPARE - TraHydrologistedical): No    Lack of Transportation (Non-Medical): No  Physical Activity: Insufficiently Active (08/05/2019)   Exercise Vital Sign    Days of Exercise per Week: 3 days    Minutes of Exercise per Session: 30 min  Stress: No Stress Concern Present (08/05/2019)   [place]  Stress Questionnaire    Feeling of Stress : Only a little  Social Connections: Unknown (08/05/2019)   Social Connection and Isolation Panel [NHANES]    Frequency of Communication with Friends and Family: More than three times a week    Frequency of Social Gatherings with Friends and Family: More than three times a week    Attends Religious Services: Patient refused    Marine scientist or Organizations: Patient refused    Attends Music therapist: Patient refused    Marital Status: Living with partner  Intimate Partner Violence: Not on file    Family History  Problem Relation Age of Onset   Diabetes Father    Hypertension Mother    Cancer Mother        melanoma   Stroke Maternal Grandfather    Stroke Paternal Grandfather    COPD Neg Hx    Heart disease Neg Hx     The following portions of the  patient's history were reviewed and updated as appropriate: allergies, current medications, past family history, past medical history, past social history, past surgical history and problem list.  Review of Systems Review of Systems - Negative except as mentioned in HPI Review of Systems - General ROS: negative for - chills, fatigue, fever, hot flashes, malaise or night sweats Hematological and Lymphatic ROS: negative for - bleeding problems or swollen lymph nodes Gastrointestinal ROS: negative for - abdominal pain, blood in stools, change in bowel habits and nausea/vomiting Musculoskeletal ROS: negative for - joint pain, muscle pain or muscular weakness Genito-Urinary ROS: negative for - change in menstrual cycle, dysmenorrhea, dyspareunia, dysuria,  genital ulcers, hematuria, incontinence, irregular/heavy menses, nocturia or pelvic pain. Increased vaginal discharge  Objective:   BP 130/81   Pulse 78   Resp 16   Wt 155 lb 1.6 oz (70.4 kg)   LMP  (Within Weeks)   BMI 25.03 kg/m  CONSTITUTIONAL: Well-developed, well-nourished female in no acute distress.  HENT:  Normocephalic, atraumatic.  NECK: Normal range of motion, supple, no masses.  Normal thyroid.  SKIN: Skin is warm and dry. No rash noted. Not diaphoretic. No erythema. No pallor. Light brown mole noted on pt left lower back towards her hip, more oval in shape , no raised .  [place]: Alert and oriented to person, place, and time. PSYCHIATRIC: Normal mood and affect. Normal behavior. Normal judgment and thought content. CARDIOVASCULAR:Not Examined RESPIRATORY: Not Examined BREASTS: right nipple pt has scar tissue , she shaves area . Right nipple suspect scar tissue from shaving the hair around the nipple. No redness, no tenderness on exam. ABDOMEN: Soft, non distended; Non tender.  No Organomegaly. PELVIC:  External Genitalia: Normal  BUS: Normal  Vagina: Normal, white discharge, no odor   Cervix: Normal   MUSCULOSKELETAL:  Normal range of motion. No tenderness.  No cyanosis, clubbing, or edema.   Assessment:   Vaginal discharge Scar tissue right areola Mole    Plan:   Swab collected, will follow up with results. Mole appears to be normal , no concerns at this time. Pt mother has history of skin cancer. Discussed having visit for total body evaluation. Reassurance given regarding firm area on nipple . Discussed warning symptoms. She verbalizes and agrees to plan.   Philip Aspen, CNM

## 2022-09-23 ENCOUNTER — Encounter: Payer: Self-pay | Admitting: Certified Nurse Midwife

## 2022-09-23 LAB — CERVICOVAGINAL ANCILLARY ONLY
Bacterial Vaginitis (gardnerella): NEGATIVE
Candida Glabrata: NEGATIVE
Candida Vaginitis: NEGATIVE
Chlamydia: NEGATIVE
Comment: NEGATIVE
Comment: NEGATIVE
Comment: NEGATIVE
Comment: NEGATIVE
Comment: NEGATIVE
Comment: NORMAL
Neisseria Gonorrhea: NEGATIVE
Trichomonas: NEGATIVE

## 2023-01-23 ENCOUNTER — Encounter: Payer: Medicaid Other | Admitting: Certified Nurse Midwife

## 2023-01-31 ENCOUNTER — Ambulatory Visit: Payer: Medicaid Other | Admitting: Certified Nurse Midwife

## 2023-01-31 ENCOUNTER — Encounter: Payer: Self-pay | Admitting: Certified Nurse Midwife

## 2023-01-31 ENCOUNTER — Other Ambulatory Visit (HOSPITAL_COMMUNITY)
Admission: RE | Admit: 2023-01-31 | Discharge: 2023-01-31 | Disposition: A | Discharge status: Home / Self Care | Payer: Medicaid Other | Source: Ambulatory Visit | Attending: Certified Nurse Midwife | Admitting: Certified Nurse Midwife

## 2023-01-31 VITALS — BP 148/93 | HR 78 | Wt 164.1 lb

## 2023-01-31 DIAGNOSIS — Z3009 Encounter for other general counseling and advice on contraception: Secondary | ICD-10-CM | POA: Diagnosis not present

## 2023-01-31 DIAGNOSIS — F39 Unspecified mood [affective] disorder: Secondary | ICD-10-CM

## 2023-01-31 DIAGNOSIS — Z113 Encounter for screening for infections with a predominantly sexual mode of transmission: Secondary | ICD-10-CM | POA: Diagnosis present

## 2023-01-31 DIAGNOSIS — Z3045 Encounter for surveillance of transdermal patch hormonal contraceptive device: Secondary | ICD-10-CM

## 2023-01-31 MED ORDER — NORELGESTROMIN-ETH ESTRADIOL 150-35 MCG/24HR TD PTWK: 1.0000 | MEDICATED_PATCH | TRANSDERMAL | 12 refills | Status: DC

## 2023-01-31 MED ORDER — CITALOPRAM HYDROBROMIDE 20 MG PO TABS: 20.0000 mg | ORAL_TABLET | Freq: Every day | ORAL | 0 refills | Status: DC

## 2023-01-31 NOTE — Patient Instructions (Signed)

## 2023-01-31 NOTE — Progress Notes (Signed)
GYN ENCOUNTER NOTE  Subjective:       Cynthia Harrell is a 37 y.o. [phone] female is here for gynecologic evaluation of the following issues:  1. STD screening, has a new partner  2.  Switch birth control  3. Premenstrual mood disorder- states she is very emotional each month just before and during her cycle.    Gynecologic History Patient's last menstrual period was 01/05/2023 (approximate). Contraception: condoms and OCP (estrogen/progesterone) Last Pap: 10/12/20. Results were: abnormal ( ASCUS /-hpv) Last mammogram: n/a .   Obstetric History OB History  Gravida Para Term Preterm AB Living  4 3 3   1 3   SAB IAB Ectopic Multiple Live Births    1   0 3    # Outcome Date GA Lbr Len/2nd Weight Sex Delivery Anes PTL Lv  4 Term 08/09/19 [redacted]w[redacted]d  8 lb 13.8 oz (4.02 kg) F Vag-Spont None  LIV  3 Term 11/06/17 [redacted]w[redacted]d / 00:43 9 lb 4.9 oz (4.22 kg) F Vag-Spont EPI  LIV  2 Term 12/04/06 [redacted]w[redacted]d  8 lb 1 oz (3.657 kg) F Vag-Spont EPI N LIV  1 IAB      TAB       Past Medical History:  Diagnosis Date   Gestational diabetes    Herpes simplex without complication    High-risk sexual behavior    Microalbuminuria     Past Surgical History:  Procedure Laterality Date   THERAPEUTIC ABORTION  2008    Current Outpatient Medications on File Prior to Visit  Medication Sig Dispense Refill   metaxalone (SKELAXIN) 800 MG tablet Take by mouth.     naproxen (NAPROSYN) 500 MG tablet Take by mouth.     predniSONE (DELTASONE) 10 MG tablet 6-day taper; 6 pills day one, 5 pills day 2, 4 pills day 3, 3 pills on day 4, 2 pills on day 5, 1 pill on day 6. Follow package instructions.     norethindrone-ethinyl estradiol-FE (LOESTRIN FE) 1-20 MG-MCG tablet Take 1 tablet by mouth daily. (Patient not taking: Reported on 01/31/2023) 84 tablet 3   valACYclovir (VALTREX) 500 MG tablet Take 1 tablet (500 mg total) by mouth 2 (two) times daily. (Patient not taking: Reported on 01/31/2023) 180 tablet 1   No current  facility-administered medications on file prior to visit.    No Known Allergies  Social History   Socioeconomic History   Marital status: Significant Other    Spouse name: Aneta Mins    Number of children: Not on file   Years of education: Not on file   Highest education level: Not on file  Occupational History   Not on file  Tobacco Use   Smoking status: Never   Smokeless tobacco: Never  Vaping Use   Vaping Use: Never used  Substance and Sexual Activity   Alcohol use: No    Alcohol/week: 0.0 standard drinks of alcohol   Drug use: No   Sexual activity: Yes    Birth control/protection: Pill  Other Topics Concern   Not on file  Social History Narrative   Not on file   Social Determinants of Health   Financial Resource Strain: Low Risk  (08/05/2019)   Overall Financial Resource Strain (CARDIA)    Difficulty of Paying Living Expenses: Not hard at all  Food Insecurity: No Food Insecurity (08/05/2019)   Hunger Vital Sign    Worried About Running Out of Food in the Last Year: Never true    Ran Out of Food in  the Last Year: Never true  Transportation Needs: No Transportation Needs (08/05/2019)   PRAPARE - Administrator, Civil Service (Medical): No    Lack of Transportation (Non-Medical): No  Physical Activity: Insufficiently Active (08/05/2019)   Exercise Vital Sign    Days of Exercise per Week: 3 days    Minutes of Exercise per Session: 30 min  Stress: No Stress Concern Present (08/05/2019)   [employer] of Occupational Health - Occupational Stress Questionnaire    Feeling of Stress : Only a little  Social Connections: Unknown (08/05/2019)   Social Connection and Isolation Panel [NHANES]    Frequency of Communication with Friends and Family: More than three times a week    Frequency of Social Gatherings with Friends and Family: More than three times a week    Attends Religious Services: Patient declined    Database administrator or Organizations:  Patient declined    Attends Engineer, structural: Patient declined    Marital Status: Living with partner  Intimate Partner Violence: Not on file    Family History  Problem Relation Age of Onset   Diabetes Father    Hypertension Mother    Cancer Mother        melanoma   Stroke Maternal Grandfather    Stroke Paternal Grandfather    COPD Neg Hx    Heart disease Neg Hx     The following portions of the patient's history were reviewed and updated as appropriate: allergies, current medications, past family history, past medical history, past social history, past surgical history and problem list.  Review of Systems Review of Systems - Negative except as mentioned in HPI Review of Systems - General ROS: negative for - chills, fatigue, fever, hot flashes, malaise or night sweats Hematological and Lymphatic ROS: negative for - bleeding problems or swollen lymph nodes Gastrointestinal ROS: negative for - abdominal pain, blood in stools, change in bowel habits and nausea/vomiting Musculoskeletal ROS: negative for - joint pain, muscle pain or muscular weakness Genito-Urinary ROS: negative for - change in menstrual cycle, dysmenorrhea, dyspareunia, dysuria, genital discharge, genital ulcers, hematuria, incontinence, irregular/heavy menses, nocturia or pelvic painjj  Objective:   BP (!) 148/93   Pulse 78   Wt 164 lb 1.6 oz (74.4 kg)   LMP 01/05/2023 (Approximate)   BMI 26.49 kg/m  CONSTITUTIONAL: Well-developed, well-nourished female in no acute distress.  HENT:  Normocephalic, atraumatic.  NECK: Normal range of motion, supple, no masses.  Normal thyroid.  SKIN: Skin is warm and dry. No rash noted. Not diaphoretic. No erythema. No pallor. NEUROLGIC: Alert and oriented to person, place, and time. PSYCHIATRIC: Normal mood and affect. Normal behavior. Normal judgment and thought content. CARDIOVASCULAR:Not Examined RESPIRATORY: Not Examined BREASTS: Not Examined ABDOMEN: Soft,  non distended; Non tender.  No Organomegaly. PELVIC:  External Genitalia: Normal  BUS: Normal  Vagina: Normal  Cervix: Normal, small amount of brown d/c pt states she is starting her period.    MUSCULOSKELETAL: Normal range of motion. No tenderness.  No cyanosis, clubbing, or edema.     Assessment:   Premenstrual Mood disorder Birth control counseling STD screening    Plan:   Celexa ordered for PMMD, discussed counseling. Pt state she has someone that she has seen in the past that she will reach out to. Discussed concerns with BC and her mood. She will change to patch and trial of SSRI to assist with mood changes. STD screening due to having new partner. Will follow  up with results. Pt has appointment in 2 wks for annual exam . Follow up as scheduled.   Doreene Burke, CNM

## 2023-02-01 LAB — CERVICOVAGINAL ANCILLARY ONLY
Bacterial Vaginitis (gardnerella): NEGATIVE
Candida Glabrata: NEGATIVE
Candida Vaginitis: NEGATIVE
Chlamydia: NEGATIVE
Comment: NEGATIVE
Comment: NEGATIVE
Comment: NEGATIVE
Comment: NEGATIVE
Comment: NEGATIVE
Comment: NORMAL
Neisseria Gonorrhea: NEGATIVE
Trichomonas: NEGATIVE

## 2023-02-01 LAB — HEPATITIS C ANTIBODY: Hep C Virus Ab: NONREACTIVE

## 2023-02-01 LAB — HIV ANTIBODY (ROUTINE TESTING W REFLEX): HIV Screen 4th Generation wRfx: NONREACTIVE

## 2023-02-01 LAB — HEPATITIS B SURFACE ANTIGEN: Hepatitis B Surface Ag: NEGATIVE

## 2023-02-01 LAB — RPR: RPR Ser Ql: NONREACTIVE

## 2023-02-16 ENCOUNTER — Encounter: Payer: Self-pay | Admitting: Certified Nurse Midwife

## 2023-02-16 ENCOUNTER — Ambulatory Visit (INDEPENDENT_AMBULATORY_CARE_PROVIDER_SITE_OTHER): Payer: Medicaid Other | Admitting: Certified Nurse Midwife

## 2023-02-16 VITALS — BP 127/83 | HR 80 | Ht 66.0 in | Wt 157.7 lb

## 2023-02-16 DIAGNOSIS — Z01419 Encounter for gynecological examination (general) (routine) without abnormal findings: Secondary | ICD-10-CM

## 2023-02-16 MED ORDER — CITALOPRAM HYDROBROMIDE 20 MG PO TABS: 20.0000 mg | ORAL_TABLET | Freq: Every day | ORAL | 3 refills | Status: AC

## 2023-02-16 MED ORDER — NORELGESTROMIN-ETH ESTRADIOL 150-35 MCG/24HR TD PTWK: 1.0000 | MEDICATED_PATCH | TRANSDERMAL | 12 refills | Status: DC

## 2023-02-16 NOTE — Progress Notes (Signed)
GYNECOLOGY ANNUAL PREVENTATIVE CARE ENCOUNTER NOTE  History:     Cynthia Harrell is a 37 y.o. [phone] female here for a routine annual gynecologic exam.  Current complaints: none.   Denies abnormal vaginal bleeding, discharge, pelvic pain, problems with intercourse or other gynecologic concerns.     Social Relationship: dating  Living: with children  Work:  [employer]  Exercise: 2 times a week  Smoke/Alcohol/drug use: occasional alcohol use   Gynecologic History Patient's last menstrual period was 02/08/2023 (approximate). Contraception: Ortho-Evra patches weekly Last Pap: 10/12/2020. Results were: ascus /_HPV  Last mammogram: n/a .    Upstream - 02/16/23 1038       Pregnancy Intention Screening   Does the patient want to become pregnant in the next year? No    Does the patient's partner want to become pregnant in the next year? No    Would the patient like to discuss contraceptive options today? Yes      Contraception Wrap Up   Current Method Female Condom    End Method Female Condom            The pregnancy intention screening data noted above was reviewed. Potential methods of contraception were discussed. The patient elected to proceed with Female Condom.   Obstetric History OB History  Gravida Para Term Preterm AB Living  4 3 3   1 3   SAB IAB Ectopic Multiple Live Births    1   0 3    # Outcome Date GA Lbr Len/2nd Weight Sex Delivery Anes PTL Lv  4 Term 08/09/19 [redacted]w[redacted]d  8 lb 13.8 oz (4.02 kg) F Vag-Spont None  LIV  3 Term 11/06/17 [redacted]w[redacted]d / 00:43 9 lb 4.9 oz (4.22 kg) F Vag-Spont EPI  LIV  2 Term 12/04/06 [redacted]w[redacted]d  8 lb 1 oz (3.657 kg) F Vag-Spont EPI N LIV  1 IAB      TAB       Past Medical History:  Diagnosis Date   Gestational diabetes    Herpes simplex without complication    High-risk sexual behavior    Microalbuminuria     Past Surgical History:  Procedure Laterality Date   THERAPEUTIC ABORTION  2008    Current Outpatient Medications on  File Prior to Visit  Medication Sig Dispense Refill   citalopram (CELEXA) 20 MG tablet Take 1 tablet (20 mg total) by mouth daily. 90 tablet 0   metaxalone (SKELAXIN) 800 MG tablet Take by mouth.     norelgestromin-ethinyl estradiol [employer]) 150-35 MCG/24HR transdermal patch Place 1 patch onto the skin once a week. 3 patch 12   predniSONE (DELTASONE) 10 MG tablet 6-day taper; 6 pills day one, 5 pills day 2, 4 pills day 3, 3 pills on day 4, 2 pills on day 5, 1 pill on day 6. Follow package instructions.     valACYclovir (VALTREX) 500 MG tablet Take 1 tablet (500 mg total) by mouth 2 (two) times daily. (Patient not taking: Reported on 01/31/2023) 180 tablet 1   No current facility-administered medications on file prior to visit.    No Known Allergies  Social History:  reports that she has never smoked. She has never used smokeless tobacco. She reports that she does not drink alcohol and does not use drugs.  Family History  Problem Relation Age of Onset   Diabetes Father    Hypertension Mother    Cancer Mother        melanoma  Stroke Maternal Grandfather    Stroke Paternal Grandfather    COPD Neg Hx    Heart disease Neg Hx     The following portions of the patient's history were reviewed and updated as appropriate: allergies, current medications, past family history, past medical history, past social history, past surgical history and problem list.  Review of Systems Pertinent items noted in HPI and remainder of comprehensive ROS otherwise negative.  Physical Exam:  BP 127/83   Pulse 80   Ht 5\' 6"  (1.676 m)   Wt 157 lb 11.2 oz (71.5 kg)   LMP 02/08/2023 (Approximate)   BMI 25.45 kg/m  CONSTITUTIONAL: Well-developed, well-nourished female in no acute distress.  HENT:  Normocephalic, atraumatic, External right and left ear normal. Oropharynx is clear and moist EYES: Conjunctivae and EOM are normal. Pupils are equal, round, and reactive to light. No scleral icterus.  NECK: Normal  range of motion, supple, no masses.  Normal thyroid.  SKIN: Skin is warm and dry. No rash noted. Not diaphoretic. No erythema. No pallor. MUSCULOSKELETAL: Normal range of motion. No tenderness.  No cyanosis, clubbing, or edema.  2+ distal pulses. NEUROLOGIC: Alert and oriented to person, place, and time. Normal reflexes, muscle tone coordination.  PSYCHIATRIC: Normal mood and affect. Normal behavior. Normal judgment and thought content. CARDIOVASCULAR: Normal heart rate noted, regular rhythm RESPIRATORY: Clear to auscultation bilaterally. Effort and breath sounds normal, no problems with respiration noted. BREASTS: Symmetric in size. No masses, tenderness, skin changes, nipple drainage, or lymphadenopathy bilaterally.  ABDOMEN: Soft, no distention noted.  No tenderness, rebound or guarding.  PELVIC: Normal appearing external genitalia and urethral meatus; normal appearing vaginal mucosa and cervix.  No abnormal discharge noted.  Pap smear not due.   Normal uterine size, no other palpable masses, no uterine or adnexal tenderness.  .   Assessment and Plan:    1. Women's annual routine gynecological examination    Pap: due 2025 Mammogram : n/a  Labs: none  Refills: none  Referral: none  Routine preventative health maintenance measures emphasized. Please refer to After Visit Summary for other counseling recommendations.      Doreene Burke, CNM [facility]  [facility],  [facility]

## 2023-02-24 ENCOUNTER — Ambulatory Visit (INDEPENDENT_AMBULATORY_CARE_PROVIDER_SITE_OTHER): Payer: Medicaid Other

## 2023-02-24 ENCOUNTER — Other Ambulatory Visit (HOSPITAL_COMMUNITY)
Admission: RE | Admit: 2023-02-24 | Discharge: 2023-02-24 | Disposition: A | Discharge status: Home / Self Care | Payer: Medicaid Other | Source: Ambulatory Visit | Attending: Certified Nurse Midwife | Admitting: Certified Nurse Midwife

## 2023-02-24 VITALS — BP 120/80 | HR 75 | Ht 66.0 in | Wt 159.0 lb

## 2023-02-24 DIAGNOSIS — M545 Low back pain, unspecified: Secondary | ICD-10-CM | POA: Diagnosis present

## 2023-02-24 DIAGNOSIS — N898 Other specified noninflammatory disorders of vagina: Secondary | ICD-10-CM

## 2023-02-24 LAB — POCT URINALYSIS DIPSTICK
Bilirubin, UA: NEGATIVE
Blood, UA: NEGATIVE
Glucose, UA: NEGATIVE
Ketones, UA: NEGATIVE
Leukocytes, UA: NEGATIVE
Nitrite, UA: NEGATIVE
Protein, UA: NEGATIVE
Spec Grav, UA: 1.01 (ref 1.010–1.025)
Urobilinogen, UA: 1 E.U./dL
pH, UA: 6.5 (ref 5.0–8.0)

## 2023-02-24 MED ORDER — FLUCONAZOLE 150 MG PO TABS
150.0000 mg | ORAL_TABLET | Freq: Every day | ORAL | 0 refills | Status: DC
Start: 2023-02-24 — End: 2023-05-30

## 2023-02-24 NOTE — Progress Notes (Signed)
    NURSE VISIT NOTE  Subjective:    Patient ID: Nechama Guard, female    DOB: May 07, 1986, 37 y.o.   MRN: [id number]  HPI  Patient is a 37 y.o. [phone] female who presents for  vaginal itching for 3 day(s). Denies abnormal vaginal bleeding or significant pelvic pain or fever. admits to urinary frequency and  lower back pain . Patient has history of known exposure to STD.   Objective:    BP 120/80   Pulse 75   Ht 5\' 6"  (1.676 m)   Wt 159 lb (72.1 kg)   LMP 02/08/2023 (Approximate)   BMI 25.66 kg/m    @THIS  VISIT ONLY@  Assessment:   1. Vaginal itching   2. Acute midline low back pain without sciatica       Plan:   GC and chlamydia  Yeast and BV DNA  probe sent to lab. Treatment: abstain from coitus during course of treatment and Diflucan sent to pharmacy ROV prn if symptoms persist or worsen.   Cornelius Moras, CMA

## 2023-02-24 NOTE — Patient Instructions (Signed)

## 2023-02-27 LAB — URINE CULTURE

## 2023-02-27 LAB — CERVICOVAGINAL ANCILLARY ONLY
Chlamydia: NEGATIVE
Comment: NEGATIVE
Comment: NEGATIVE
Comment: NEGATIVE
Comment: NORMAL
Neisseria Gonorrhea: NEGATIVE

## 2023-02-28 LAB — CERVICOVAGINAL ANCILLARY ONLY
Bacterial Vaginitis (gardnerella): NEGATIVE
Candida Glabrata: NEGATIVE
Candida Vaginitis: NEGATIVE
Comment: NEGATIVE
Comment: NEGATIVE
Trichomonas: NEGATIVE

## 2023-03-22 ENCOUNTER — Encounter: Payer: Self-pay | Admitting: Certified Nurse Midwife

## 2023-03-24 ENCOUNTER — Other Ambulatory Visit: Payer: Self-pay | Admitting: Certified Nurse Midwife

## 2023-03-24 DIAGNOSIS — N390 Urinary tract infection, site not specified: Secondary | ICD-10-CM

## 2023-03-24 MED ORDER — VALACYCLOVIR HCL 500 MG PO TABS
500.0000 mg | ORAL_TABLET | Freq: Two times a day (BID) | ORAL | 1 refills | Status: DC
Start: 2023-03-24 — End: 2024-07-16

## 2023-05-30 ENCOUNTER — Encounter: Payer: Self-pay | Admitting: Certified Nurse Midwife

## 2023-05-30 ENCOUNTER — Ambulatory Visit: Payer: Medicaid Other | Admitting: Certified Nurse Midwife

## 2023-05-30 ENCOUNTER — Other Ambulatory Visit (HOSPITAL_COMMUNITY)
Admission: RE | Admit: 2023-05-30 | Discharge: 2023-05-30 | Disposition: A | Discharge status: Home / Self Care | Payer: Medicaid Other | Source: Ambulatory Visit | Attending: Certified Nurse Midwife | Admitting: Certified Nurse Midwife

## 2023-05-30 ENCOUNTER — Other Ambulatory Visit: Payer: Self-pay | Admitting: Certified Nurse Midwife

## 2023-05-30 VITALS — BP 119/81 | HR 90 | Wt 159.3 lb

## 2023-05-30 DIAGNOSIS — N898 Other specified noninflammatory disorders of vagina: Secondary | ICD-10-CM | POA: Diagnosis present

## 2023-05-30 NOTE — Addendum Note (Signed)
Addended by: Fonda Kinder on: 05/30/2023 03:26 PM   Modules accepted: Orders

## 2023-05-30 NOTE — Progress Notes (Signed)
GYN ENCOUNTER NOTE  Subjective:       Cynthia Harrell is a 37 y.o. [phone] female is here for gynecologic evaluation of the following issues:  1. White milky discharge for 2-3 days. She denies itching, odor, or burning 2. Ingrown hairs.     Gynecologic History Patient's last menstrual period was 05/23/2023. Contraception: Ortho-Evra patches weekly Last Pap: 10/12/2020. Results were: Ascus/-HPV  Last mammogram: n/a .   Obstetric History OB History  Gravida Para Term Preterm AB Living  4 3 3   1 3   SAB IAB Ectopic Multiple Live Births    1   0 3    # Outcome Date GA Lbr Len/2nd Weight Sex Type Anes PTL Lv  4 Term 08/09/19 [redacted]w[redacted]d  8 lb 13.8 oz (4.02 kg) F Vag-Spont None  LIV  3 Term 11/06/17 [redacted]w[redacted]d / 00:43 9 lb 4.9 oz (4.22 kg) F Vag-Spont EPI  LIV  2 Term 12/04/06 [redacted]w[redacted]d  8 lb 1 oz (3.657 kg) F Vag-Spont EPI N LIV  1 IAB      TAB       Past Medical History:  Diagnosis Date   Gestational diabetes    Herpes simplex without complication    High-risk sexual behavior    Microalbuminuria     Past Surgical History:  Procedure Laterality Date   THERAPEUTIC ABORTION  2008    Current Outpatient Medications on File Prior to Visit  Medication Sig Dispense Refill   citalopram (CELEXA) 20 MG tablet Take 1 tablet (20 mg total) by mouth daily. 90 tablet 3   fluconazole (DIFLUCAN) 150 MG tablet Take 1 tablet (150 mg total) by mouth daily. 1 tablet 0   metaxalone (SKELAXIN) 800 MG tablet Take by mouth.     norelgestromin-ethinyl estradiol [employer]) 150-35 MCG/24HR transdermal patch Place 1 patch onto the skin once a week. 3 patch 12   predniSONE (DELTASONE) 10 MG tablet 6-day taper; 6 pills day one, 5 pills day 2, 4 pills day 3, 3 pills on day 4, 2 pills on day 5, 1 pill on day 6. Follow package instructions.     valACYclovir (VALTREX) 500 MG tablet Take 1 tablet (500 mg total) by mouth 2 (two) times daily. 180 tablet 1   No current facility-administered medications on file prior to visit.     No Known Allergies  Social History   Socioeconomic History   Marital status: Significant Other    Spouse name: Aneta Mins    Number of children: Not on file   Years of education: Not on file   Highest education level: Not on file  Occupational History   Not on file  Tobacco Use   Smoking status: Never   Smokeless tobacco: Never  Vaping Use   Vaping status: Never Used  Substance and Sexual Activity   Alcohol use: No    Alcohol/week: 0.0 standard drinks of alcohol   Drug use: No   Sexual activity: Yes    Birth control/protection: Pill  Other Topics Concern   Not on file  Social History Narrative   Not on file   Social Determinants of Health   Financial Resource Strain: Low Risk  (08/05/2019)   Overall Financial Resource Strain (CARDIA)    Difficulty of Paying Living Expenses: Not hard at all  Food Insecurity: No Food Insecurity (08/05/2019)   Hunger Vital Sign    Worried About Running Out of Food in the Last Year: Never true    Ran Out of Food in the  Last Year: Never true  Transportation Needs: No Transportation Needs (08/05/2019)   PRAPARE - Administrator, Civil Service (Medical): No    Lack of Transportation (Non-Medical): No  Physical Activity: Insufficiently Active (08/05/2019)   Exercise Vital Sign    Days of Exercise per Week: 3 days    Minutes of Exercise per Session: 30 min  Stress: No Stress Concern Present (08/05/2019)   [employer] of Occupational Health - Occupational Stress Questionnaire    Feeling of Stress : Only a little  Social Connections: Unknown (08/05/2019)   Social Connection and Isolation Panel [NHANES]    Frequency of Communication with Friends and Family: More than three times a week    Frequency of Social Gatherings with Friends and Family: More than three times a week    Attends Religious Services: Patient declined    Database administrator or Organizations: Patient declined    Attends Engineer, structural:  Patient declined    Marital Status: Living with partner  Intimate Partner Violence: Not on file    Family History  Problem Relation Age of Onset   Diabetes Father    Hypertension Mother    Cancer Mother        melanoma   Stroke Maternal Grandfather    Stroke Paternal Grandfather    COPD Neg Hx    Heart disease Neg Hx     The following portions of the patient's history were reviewed and updated as appropriate: allergies, current medications, past family history, past medical history, past social history, past surgical history and problem list.  Review of Systems Review of Systems - Negative except as mentioned in HPI Review of Systems - General ROS: negative for - chills, fatigue, fever, hot flashes, malaise or night sweats Hematological and Lymphatic ROS: negative for - bleeding problems or swollen lymph nodes Gastrointestinal ROS: negative for - abdominal pain, blood in stools, change in bowel habits and nausea/vomiting Musculoskeletal ROS: negative for - joint pain, muscle pain or muscular weakness Genito-Urinary ROS: negative for - change in menstrual cycle, dysmenorrhea, dyspareunia, dysuria,  genital ulcers, hematuria, incontinence, irregular/heavy menses, nocturia or pelvic pain.  Positive for change in discharge   Objective:   BP 119/81   Pulse 90   Wt 159 lb 4.8 oz (72.3 kg)   LMP 05/23/2023   BMI 25.71 kg/m  CONSTITUTIONAL: Well-developed, well-nourished female in no acute distress.  HENT:  Normocephalic, atraumatic.  NECK: Normal range of motion, supple, no masses.  Normal thyroid.  SKIN: Skin is warm and dry. No rash noted. Not diaphoretic. No erythema. No pallor. Red papule upper inner thigh a few noted on right and left inner thigh  NEUROLGIC: Alert and oriented to person, place, and time. PSYCHIATRIC: Normal mood and affect. Normal behavior. Normal judgment and thought content. CARDIOVASCULAR:Not Examined RESPIRATORY: Not Examined BREASTS: Not  Examined ABDOMEN: Soft, non distended; Non tender.  No Organomegaly. PELVIC:  External Genitalia: Normal  BUS: Normal  Vagina: Normal, clear to white discharge  Cervix: Normal,    MUSCULOSKELETAL: Normal range of motion. No tenderness.  No cyanosis, clubbing, or edema.     Assessment:   Vaginal discharge Infect hair follicle    Plan:   Swab collected, will follow up with results. 3-4 papule noted on upper inner thigh and near buttocks in different stages of healing. Discussed shaving , pt does shave the area .  Cleaning area prior to shaving and using new/clean  razor and use of coconut oil  prior to shaving .  She verbalizes and agrees. Follow up prn .   Doreene Burke, CNM

## 2023-06-01 ENCOUNTER — Encounter: Payer: Self-pay | Admitting: Certified Nurse Midwife

## 2023-06-01 LAB — CERVICOVAGINAL ANCILLARY ONLY
Bacterial Vaginitis (gardnerella): NEGATIVE
Candida Glabrata: NEGATIVE
Candida Vaginitis: NEGATIVE
Chlamydia: NEGATIVE
Comment: NEGATIVE
Comment: NEGATIVE
Comment: NEGATIVE
Comment: NEGATIVE
Comment: NEGATIVE
Comment: NORMAL
Neisseria Gonorrhea: NEGATIVE
Trichomonas: NEGATIVE

## 2023-06-07 ENCOUNTER — Ambulatory Visit: Payer: Medicaid Other | Admitting: Certified Nurse Midwife

## 2023-11-03 ENCOUNTER — Encounter: Payer: Self-pay | Admitting: Certified Nurse Midwife

## 2023-11-03 ENCOUNTER — Other Ambulatory Visit: Payer: Self-pay | Admitting: Certified Nurse Midwife

## 2023-11-03 ENCOUNTER — Telehealth: Payer: Self-pay

## 2023-11-03 MED ORDER — FLUCONAZOLE 150 MG PO TABS: 150.0000 mg | ORAL_TABLET | ORAL | 0 refills | Status: AC

## 2023-11-03 NOTE — Telephone Encounter (Signed)
Pt aware.

## 2023-11-03 NOTE — Telephone Encounter (Signed)
Pt left msg on triage asking if a Rx for fluconazole could be called in to her pharmacy. Called her back to get more info and advise of OTC 7 day monistat, no answer, LVMTRC.

## 2023-11-03 NOTE — Progress Notes (Signed)
Presumptive treatment for recurrent yeast after completion of antibiotics for recent pneumonia, has appointment Monday for nurse visit.

## 2023-11-03 NOTE — Telephone Encounter (Signed)
Pt called back. Stated a couple of weeks ago she was prescribed 2 abx for pneumonia and was given diflucan for yeast after pneumonia treatment. She is currently having discharge, mild itching, sour odor and irritation. Wondering if these sx are still from those abx. She has not tried monistat and would really like diflucan called in. Has self swab appt on Monday at 2:15. Do you approve? Or have her reach out to pcp or urgent care?

## 2023-11-06 ENCOUNTER — Ambulatory Visit: Payer: Medicaid Other

## 2023-12-28 ENCOUNTER — Ambulatory Visit: Payer: Medicaid Other | Admitting: Certified Nurse Midwife

## 2024-02-21 ENCOUNTER — Other Ambulatory Visit (HOSPITAL_COMMUNITY)
Admission: RE | Admit: 2024-02-21 | Discharge: 2024-02-21 | Disposition: A | Discharge status: Home / Self Care | Source: Ambulatory Visit | Attending: Certified Nurse Midwife | Admitting: Certified Nurse Midwife

## 2024-02-21 ENCOUNTER — Encounter: Payer: Self-pay | Admitting: Certified Nurse Midwife

## 2024-02-21 ENCOUNTER — Ambulatory Visit (INDEPENDENT_AMBULATORY_CARE_PROVIDER_SITE_OTHER): Admitting: Certified Nurse Midwife

## 2024-02-21 VITALS — BP 120/73 | HR 76 | Ht 66.0 in | Wt 168.9 lb

## 2024-02-21 DIAGNOSIS — Z8632 Personal history of gestational diabetes: Secondary | ICD-10-CM

## 2024-02-21 DIAGNOSIS — Z113 Encounter for screening for infections with a predominantly sexual mode of transmission: Secondary | ICD-10-CM | POA: Insufficient documentation

## 2024-02-21 DIAGNOSIS — Z124 Encounter for screening for malignant neoplasm of cervix: Secondary | ICD-10-CM | POA: Diagnosis present

## 2024-02-21 DIAGNOSIS — Z1329 Encounter for screening for other suspected endocrine disorder: Secondary | ICD-10-CM

## 2024-02-21 DIAGNOSIS — Z01419 Encounter for gynecological examination (general) (routine) without abnormal findings: Secondary | ICD-10-CM | POA: Diagnosis not present

## 2024-02-21 NOTE — Patient Instructions (Signed)

## 2024-02-21 NOTE — Progress Notes (Signed)
 GYNECOLOGY ANNUAL PREVENTATIVE CARE ENCOUNTER NOTE  History:      Cynthia Harrell is a 38 y.o. [phone] female here for a routine annual gynecologic exam.  Current complaints: none.   Denies abnormal vaginal bleeding, discharge, pelvic pain, problems with intercourse or other gynecologic concerns.     Social Relationship:dating Living: with her children Work: [employer]  Exercise: stationary bike, Adult nurse [employer]:[id number] alcohol use   Gynecologic History Patient's last menstrual period was 02/02/2024 (exact date). Contraception: condoms Last Pap: 10/12/2020. Results were: abnormal with negative HPV   Obstetric History OB History  Gravida Para Term Preterm AB Living  4 3 3  1 3   SAB IAB Ectopic Multiple Live Births   1  0 3    # Outcome Date GA Lbr Len/2nd Weight Sex Type Anes PTL Lv  4 Term 08/09/19 [redacted]w[redacted]d  8 lb 13.8 oz (4.02 kg) F Vag-Spont None  LIV  3 Term 11/06/17 [redacted]w[redacted]d / 00:43 9 lb 4.9 oz (4.22 kg) F Vag-Spont EPI  LIV  2 Term 12/04/06 105w0d  8 lb 1 oz (3.657 kg) F Vag-Spont EPI N LIV  1 IAB      TAB       Past Medical History:  Diagnosis Date   Gestational diabetes    Herpes simplex without complication    High-risk sexual behavior    Microalbuminuria     Past Surgical History:  Procedure Laterality Date   THERAPEUTIC ABORTION  2008    Current Outpatient Medications on File Prior to Visit  Medication Sig Dispense Refill   citalopram  (CELEXA ) 20 MG tablet Take 1 tablet (20 mg total) by mouth daily. 90 tablet 3   valACYclovir  (VALTREX ) 500 MG tablet Take 1 tablet (500 mg total) by mouth 2 (two) times daily. 180 tablet 1   No current facility-administered medications on file prior to visit.    No Known Allergies  Social History:  reports that she has never smoked. She has never used smokeless tobacco. She reports that she does not drink alcohol and does not use drugs.  Family History  Problem Relation Age of Onset    Diabetes Father    Hypertension Mother    Cancer Mother        melanoma   Stroke Maternal Grandfather    Stroke Paternal Grandfather    COPD Neg Hx    Heart disease Neg Hx     The following portions of the patient's history were reviewed and updated as appropriate: allergies, current medications, past family history, past medical history, past social history, past surgical history and problem list.  Review of Systems Pertinent items noted in HPI and remainder of comprehensive ROS otherwise negative.  Physical Exam:  BP 120/73   Pulse 76   Ht 5\' 6"  (1.676 m)   Wt 168 lb 14.4 oz (76.6 kg)   LMP 02/02/2024 (Exact Date)   BMI 27.26 kg/m  CONSTITUTIONAL: Well-developed, well-nourished female in no acute distress.  HENT:  Normocephalic, atraumatic, External right and left ear normal. Oropharynx is clear and moist EYES: Conjunctivae and EOM are normal. Pupils are equal, round, and reactive to light. No scleral icterus.  NECK: Normal range of motion, supple, no masses.  Normal thyroid .  SKIN: Skin is warm and dry. No rash noted. Not diaphoretic. No erythema. No pallor. MUSCULOSKELETAL: Normal range of motion. No tenderness.  No cyanosis, clubbing, or edema.  2+ distal pulses. NEUROLOGIC: Alert and oriented to person, place,  and time. Normal reflexes, muscle tone coordination.  PSYCHIATRIC: Normal mood and affect. Normal behavior. Normal judgment and thought content. CARDIOVASCULAR: Normal heart rate noted, regular rhythm RESPIRATORY: Clear to auscultation bilaterally. Effort and breath sounds normal, no problems with respiration noted. BREASTS: Symmetric in size. No masses, tenderness, skin changes, nipple drainage, or lymphadenopathy bilaterally.  ABDOMEN: Soft, no distention noted.  No tenderness, rebound or guarding.  PELVIC: Normal appearing external genitalia and urethral meatus; normal appearing vaginal mucosa and cervix.  No abnormal discharge noted.  Pap smear obtained. Contact  bleeding. Normal uterine size, no other palpable masses, no uterine or adnexal tenderness.  .   Assessment and Plan:    1. Well woman exam with routine gynecological exam (Primary)   2. Screening for cervical cancer  Pap: Will follow up results of pap smear and manage accordingly. Labs:TSH & T4, Hem A1c, STD testing Refills: none Referral: none Routine preventative health maintenance measures emphasized. Please refer to After Visit Summary for other counseling recommendations.      Alise Appl, CNM [facility]  [facility],  [facility]

## 2024-02-22 LAB — CERVICOVAGINAL ANCILLARY ONLY
Bacterial Vaginitis (gardnerella): NEGATIVE
Candida Glabrata: NEGATIVE
Candida Vaginitis: NEGATIVE
Chlamydia: NEGATIVE
Comment: NEGATIVE
Comment: NEGATIVE
Comment: NEGATIVE
Comment: NEGATIVE
Comment: NEGATIVE
Comment: NORMAL
Neisseria Gonorrhea: NEGATIVE
Trichomonas: NEGATIVE

## 2024-02-22 LAB — HEPATITIS B SURFACE ANTIGEN: Hepatitis B Surface Ag: NEGATIVE

## 2024-02-22 LAB — RPR: RPR Ser Ql: NONREACTIVE

## 2024-02-22 LAB — HIV ANTIBODY (ROUTINE TESTING W REFLEX): HIV Screen 4th Generation wRfx: NONREACTIVE

## 2024-02-22 LAB — TSH+FREE T4
Free T4: 1.19 ng/dL (ref 0.82–1.77)
TSH: 1.92 u[IU]/mL (ref 0.450–4.500)

## 2024-02-22 LAB — HEMOGLOBIN A1C
Est. average glucose Bld gHb Est-mCnc: 103 mg/dL
Hgb A1c MFr Bld: 5.2 % (ref 4.8–5.6)

## 2024-02-22 LAB — HEPATITIS C ANTIBODY: Hep C Virus Ab: NONREACTIVE

## 2024-02-27 LAB — CYTOLOGY - PAP
Comment: NEGATIVE
Diagnosis: NEGATIVE
High risk HPV: NEGATIVE

## 2024-07-16 ENCOUNTER — Encounter: Payer: Self-pay | Admitting: Certified Nurse Midwife

## 2024-07-16 ENCOUNTER — Other Ambulatory Visit: Payer: Self-pay

## 2024-07-16 DIAGNOSIS — N390 Urinary tract infection, site not specified: Secondary | ICD-10-CM

## 2024-07-16 MED ORDER — VALACYCLOVIR HCL 500 MG PO TABS: 500.0000 mg | ORAL_TABLET | Freq: Two times a day (BID) | ORAL | 1 refills | Status: DC

## 2024-08-13 ENCOUNTER — Other Ambulatory Visit (HOSPITAL_COMMUNITY)
Admission: RE | Admit: 2024-08-13 | Discharge: 2024-08-13 | Disposition: A | Discharge status: Home / Self Care | Source: Ambulatory Visit | Attending: Licensed Practical Nurse | Admitting: Licensed Practical Nurse

## 2024-08-13 ENCOUNTER — Ambulatory Visit (INDEPENDENT_AMBULATORY_CARE_PROVIDER_SITE_OTHER)

## 2024-08-13 VITALS — BP 144/89 | HR 100 | Ht 66.0 in | Wt 174.8 lb

## 2024-08-13 DIAGNOSIS — N898 Other specified noninflammatory disorders of vagina: Secondary | ICD-10-CM | POA: Insufficient documentation

## 2024-08-13 NOTE — Progress Notes (Signed)
    NURSE VISIT NOTE  Subjective:    Patient ID: Cynthia Harrell, female    DOB: [birth year], 38 y.o.   MRN: [id number]  HPI  Patient is a 38 y.o. [phone] female who presents for white vaginal discharge for 10 day(s). Denies abnormal vaginal bleeding or significant pelvic pain or fever. denies dysuria. Patient has a history of known exposure to STD.   Objective:    BP (!) 144/89   Pulse 100   Ht 5' 6 (1.676 m)   Wt 174 lb 12.8 oz (79.3 kg)   LMP 07/31/2024 (Exact Date)   BMI 28.21 kg/m    No results found for any visits on 08/13/24.  Assessment:   1. Vaginal discharge     herpes genitalis  Plan:   GC and chlamydia DNA  probe sent to lab. ROV prn if symptoms persist or worsen.   Mathis LITTIE Getting, CMA

## 2024-08-16 LAB — CERVICOVAGINAL ANCILLARY ONLY
Bacterial Vaginitis (gardnerella): NEGATIVE
Candida Glabrata: NEGATIVE
Candida Vaginitis: NEGATIVE
Chlamydia: NEGATIVE
Comment: NEGATIVE
Comment: NEGATIVE
Comment: NEGATIVE
Comment: NEGATIVE
Comment: NEGATIVE
Comment: NORMAL
Neisseria Gonorrhea: NEGATIVE
Trichomonas: NEGATIVE

## 2024-10-10 NOTE — Progress Notes (Unsigned)
 GYN ENCOUNTER NOTE  Subjective:       Cynthia Harrell is a 39 y.o. [phone] female is here for gynecologic evaluation of the following issues:  1. BV. Pt has history of BV and reoccurrence. Notes she has been experiencing odor and increased discharge.  2. Birth control, she is requesting to start back on OCP. She denies any contra indications to use.  She has was sexually active in early December, had her period 09/17/24  and has not been sexually active since.  3. Refill on valtrex    Gynecologic History Patient's last menstrual period was 09/17/2024. Contraception: none Last Pap: 02/21/24. Results were: normal   Obstetric History OB History  Gravida Para Term Preterm AB Living  4 3 3  1 3   SAB IAB Ectopic Multiple Live Births   1  0 3    # Outcome Date GA Lbr Len/2nd Weight Sex Type Anes PTL Lv  4 Term 08/09/19 [redacted]w[redacted]d  8 lb 13.8 oz (4.02 kg) F Vag-Spont None  LIV  3 Term 11/06/17 [redacted]w[redacted]d / 00:43 9 lb 4.9 oz (4.22 kg) F Vag-Spont EPI  LIV  2 Term 12/04/06 [redacted]w[redacted]d  8 lb 1 oz (3.657 kg) F Vag-Spont EPI N LIV  1 IAB      TAB       Past Medical History:  Diagnosis Date   Gestational diabetes    Herpes simplex without complication    High-risk sexual behavior    Microalbuminuria     Past Surgical History:  Procedure Laterality Date   THERAPEUTIC ABORTION  2008    Medications Ordered Prior to Encounter[1]  Allergies[2]  Social History   Socioeconomic History   Marital status: Significant Other    Spouse name: Manus    Number of children: Not on file   Years of education: Not on file   Highest education level: Not on file  Occupational History   Not on file  Tobacco Use   Smoking status: Never   Smokeless tobacco: Never  Vaping Use   Vaping status: Never Used  Substance and Sexual Activity   Alcohol use: No    Alcohol/week: 0.0 standard drinks of alcohol   Drug use: No   Sexual activity: Yes    Birth control/protection: Pill  Other Topics Concern   Not on file   Social History Narrative   Not on file   Social Drivers of Health   Tobacco Use: Low Risk (02/21/2024)   Patient History    Smoking Tobacco Use: Never    Smokeless Tobacco Use: Never    Passive Exposure: Not on file  Financial Resource Strain: Not on file  Food Insecurity: Not on file  Transportation Needs: Not on file  Physical Activity: Not on file  Stress: Not on file  Social Connections: Not on file  Intimate Partner Violence: Not on file  Depression (PHQ2-9): Low Risk (01/31/2023)   Depression (PHQ2-9)    PHQ-2 Score: 3  Alcohol Screen: Not on file  Housing: Not on file  Utilities: Not on file  Health Literacy: Not on file    Family History  Problem Relation Age of Onset   Diabetes Father    Hypertension Mother    Cancer Mother        melanoma   Stroke Maternal Grandfather    Stroke Paternal Grandfather    COPD Neg Hx    Heart disease Neg Hx     The following portions of the patient's history were reviewed and updated  as appropriate: allergies, current medications, past family history, past medical history, past social history, past surgical history and problem list.  Review of Systems Review of Systems - Negative except as mentioned in HPI Review of Systems - General ROS: negative for - chills, fatigue, fever, hot flashes, malaise or night sweats Hematological and Lymphatic ROS: negative for - bleeding problems or swollen lymph nodes Gastrointestinal ROS: negative for - abdominal pain, blood in stools, change in bowel habits and nausea/vomiting Musculoskeletal ROS: negative for - joint pain, muscle pain or muscular weakness Genito-Urinary ROS: negative for - change in menstrual cycle, dysmenorrhea, dyspareunia, dysuria, genital discharge, genital ulcers, hematuria, incontinence, irregular/heavy menses, nocturia or pelvic painjj  Objective:   BP 117/73 (BP Location: Right Arm, Patient Position: Sitting)   Pulse 92   Wt 177 lb 9.6 oz (80.6 kg)   LMP 09/17/2024    BMI 28.67 kg/m  CONSTITUTIONAL: Well-developed, well-nourished female in no acute distress.  HENT:  Normocephalic, atraumatic.  NECK: Normal range of motion, supple, no masses.  Normal thyroid .  SKIN: Skin is warm and dry. No rash noted. Not diaphoretic. No erythema. No pallor. NEUROLGIC: Alert and oriented to person, place, and time. PSYCHIATRIC: Normal mood and affect. Normal behavior. Normal judgment and thought content. CARDIOVASCULAR:Not Examined RESPIRATORY: Not Examined BREASTS: Not Examined ABDOMEN: Soft, non distended; Non tender.  No Organomegaly. PELVIC:  External Genitalia: Normal  BUS: Normal  Vagina: Normal  Cervix: Normal, white discharge no odor noted.    MUSCULOSKELETAL: Normal range of motion. No tenderness.  No cyanosis, clubbing, or edema.     Assessment:   Vaginal discharge STD screening   Plan:   Pt would like to start back on OCP, she denies any contraindications to use.  Discussed risk and benefits . She verbalize understanding. Pt encouraged to use back up for 10 -14 days . Refill placed on valtrex  . Follow up for annual or prn.   Zelda Hummer, CNM      [1]  Current Outpatient Medications on File Prior to Visit  Medication Sig Dispense Refill   valACYclovir  (VALTREX ) 500 MG tablet Take 1 tablet (500 mg total) by mouth 2 (two) times daily. 180 tablet 1   citalopram  (CELEXA ) 20 MG tablet Take 1 tablet (20 mg total) by mouth daily. (Patient not taking: Reported on 10/11/2024) 90 tablet 3   No current facility-administered medications on file prior to visit.  [2] No Known Allergies

## 2024-10-10 NOTE — Patient Instructions (Signed)
  Vaginitis You will learn about the causes symptoms, and treatment for the 3 main types of vaginitis; vaginosis, yeast infection and trichomoniasis. To view the content, go to this web address: [email or web address]  This video will expire on: 08/30/2025. If you need access to this video following this date, please reach out to the healthcare provider who assigned it to you. This information is not intended to replace advice given to you by your health care provider. Make sure you discuss any questions you have with your health care provider. Elsevier Patient Education  2024 [employer].

## 2024-10-11 ENCOUNTER — Encounter: Payer: Self-pay | Admitting: Certified Nurse Midwife

## 2024-10-11 ENCOUNTER — Other Ambulatory Visit (HOSPITAL_COMMUNITY)
Admission: RE | Admit: 2024-10-11 | Discharge: 2024-10-11 | Disposition: A | Source: Ambulatory Visit | Attending: Certified Nurse Midwife | Admitting: Certified Nurse Midwife

## 2024-10-11 ENCOUNTER — Ambulatory Visit: Admitting: Certified Nurse Midwife

## 2024-10-11 VITALS — BP 117/73 | HR 92 | Wt 177.6 lb

## 2024-10-11 DIAGNOSIS — N898 Other specified noninflammatory disorders of vagina: Secondary | ICD-10-CM | POA: Diagnosis not present

## 2024-10-11 DIAGNOSIS — N390 Urinary tract infection, site not specified: Secondary | ICD-10-CM

## 2024-10-11 DIAGNOSIS — N760 Acute vaginitis: Secondary | ICD-10-CM

## 2024-10-11 DIAGNOSIS — Z113 Encounter for screening for infections with a predominantly sexual mode of transmission: Secondary | ICD-10-CM

## 2024-10-11 DIAGNOSIS — Z309 Encounter for contraceptive management, unspecified: Secondary | ICD-10-CM

## 2024-10-11 MED ORDER — VALACYCLOVIR HCL 500 MG PO TABS: 500.0000 mg | ORAL_TABLET | Freq: Two times a day (BID) | ORAL | 5 refills | Status: AC

## 2024-10-11 MED ORDER — NORETHIN ACE-ETH ESTRAD-FE 1-20 MG-MCG PO TABS: 1.0000 | ORAL_TABLET | Freq: Every day | ORAL | 3 refills | Status: AC

## 2024-10-11 NOTE — Addendum Note (Signed)
 Addended by: SEBASTIAN ZELDA HERO on: 10/11/2024 04:28 PM   Modules accepted: Orders

## 2024-10-12 LAB — HEP, RPR, HIV PANEL
HIV Screen 4th Generation wRfx: NONREACTIVE
Hepatitis B Surface Ag: NEGATIVE
RPR Ser Ql: NONREACTIVE

## 2024-10-16 LAB — CERVICOVAGINAL ANCILLARY ONLY
Bacterial Vaginitis (gardnerella): NEGATIVE
Candida Glabrata: NEGATIVE
Candida Vaginitis: NEGATIVE
Chlamydia: NEGATIVE
Comment: NEGATIVE
Comment: NEGATIVE
Comment: NEGATIVE
Comment: NEGATIVE
Comment: NEGATIVE
Comment: NORMAL
Neisseria Gonorrhea: NEGATIVE
Trichomonas: NEGATIVE
